# Patient Record
Sex: Male | Born: 1975
Health system: Southern US, Community
[De-identification: ages and names within clinical notes are randomized; demographics above are authoritative.]

## PROBLEM LIST (undated history)

## (undated) DIAGNOSIS — N200 Calculus of kidney: Secondary | ICD-10-CM

## (undated) DIAGNOSIS — A4902 Methicillin resistant Staphylococcus aureus infection, unspecified site: Secondary | ICD-10-CM

## (undated) DIAGNOSIS — R519 Headache, unspecified: Secondary | ICD-10-CM

## (undated) DIAGNOSIS — G8929 Other chronic pain: Secondary | ICD-10-CM

## (undated) DIAGNOSIS — F988 Other specified behavioral and emotional disorders with onset usually occurring in childhood and adolescence: Secondary | ICD-10-CM

## (undated) DIAGNOSIS — R51 Headache: Secondary | ICD-10-CM

## (undated) DIAGNOSIS — B1920 Unspecified viral hepatitis C without hepatic coma: Secondary | ICD-10-CM

## (undated) DIAGNOSIS — C4491 Basal cell carcinoma of skin, unspecified: Secondary | ICD-10-CM

## (undated) HISTORY — DX: Calculus of kidney: N20.0

## (undated) HISTORY — DX: Basal cell carcinoma of skin, unspecified: C44.91

## (undated) HISTORY — DX: Headache, unspecified: R51.9

## (undated) HISTORY — DX: Methicillin resistant Staphylococcus aureus infection, unspecified site: A49.02

## (undated) HISTORY — DX: Headache: R51

## (undated) HISTORY — DX: Unspecified viral hepatitis C without hepatic coma: B19.20

## (undated) HISTORY — DX: Other chronic pain: G89.29

## (undated) HISTORY — PX: SKIN CANCER EXCISION: SHX779

## (undated) HISTORY — DX: Other specified behavioral and emotional disorders with onset usually occurring in childhood and adolescence: F98.8

## (undated) HISTORY — PX: OTHER SURGICAL HISTORY: SHX169

## (undated) HISTORY — PX: MOUTH SURGERY: SHX715

---

## 1998-03-18 ENCOUNTER — Observation Stay (HOSPITAL_COMMUNITY): Admission: EM | Admit: 1998-03-18 | Discharge: 1998-03-19 | Payer: Self-pay | Admitting: Emergency Medicine

## 1998-03-21 ENCOUNTER — Emergency Department (HOSPITAL_COMMUNITY): Admission: EM | Admit: 1998-03-21 | Discharge: 1998-03-21 | Payer: Self-pay | Admitting: Emergency Medicine

## 2004-02-02 ENCOUNTER — Ambulatory Visit: Payer: Self-pay | Admitting: Internal Medicine

## 2004-02-23 ENCOUNTER — Ambulatory Visit: Payer: Self-pay | Admitting: Internal Medicine

## 2004-10-21 ENCOUNTER — Ambulatory Visit: Payer: Self-pay | Admitting: Internal Medicine

## 2004-10-25 ENCOUNTER — Encounter: Admission: RE | Admit: 2004-10-25 | Discharge: 2004-10-25 | Payer: Self-pay | Admitting: Internal Medicine

## 2004-12-27 ENCOUNTER — Ambulatory Visit: Payer: Self-pay | Admitting: Internal Medicine

## 2005-01-19 ENCOUNTER — Ambulatory Visit: Payer: Self-pay | Admitting: Internal Medicine

## 2005-06-10 ENCOUNTER — Emergency Department (HOSPITAL_COMMUNITY): Admission: EM | Admit: 2005-06-10 | Discharge: 2005-06-11 | Payer: Self-pay | Admitting: Emergency Medicine

## 2005-06-14 ENCOUNTER — Ambulatory Visit: Payer: Self-pay | Admitting: Internal Medicine

## 2009-02-23 ENCOUNTER — Emergency Department (HOSPITAL_COMMUNITY): Admission: EM | Admit: 2009-02-23 | Discharge: 2009-02-23 | Payer: Self-pay | Admitting: Emergency Medicine

## 2010-07-20 LAB — DIFFERENTIAL
Basophils Absolute: 0.1 10*3/uL (ref 0.0–0.1)
Eosinophils Absolute: 0.4 10*3/uL (ref 0.0–0.7)
Lymphocytes Relative: 34 % (ref 12–46)
Lymphs Abs: 3.6 10*3/uL (ref 0.7–4.0)
Monocytes Relative: 6 % (ref 3–12)
Neutro Abs: 5.9 10*3/uL (ref 1.7–7.7)
Neutrophils Relative %: 56 % (ref 43–77)

## 2010-07-20 LAB — CBC
HCT: 47.6 % (ref 39.0–52.0)
MCHC: 35 g/dL (ref 30.0–36.0)
MCV: 88.8 fL (ref 78.0–100.0)
Platelets: 212 10*3/uL (ref 150–400)
WBC: 10.5 10*3/uL (ref 4.0–10.5)

## 2010-07-20 LAB — POCT I-STAT, CHEM 8
BUN: 15 mg/dL (ref 6–23)
Calcium, Ion: 1.25 mmol/L (ref 1.12–1.32)
Chloride: 102 mEq/L (ref 96–112)
Creatinine, Ser: 1 mg/dL (ref 0.4–1.5)
Glucose, Bld: 87 mg/dL (ref 70–99)
HCT: 49 % (ref 39.0–52.0)
TCO2: 28 mmol/L (ref 0–100)

## 2011-01-20 ENCOUNTER — Encounter: Payer: Self-pay | Admitting: Gastroenterology

## 2011-01-20 ENCOUNTER — Ambulatory Visit (INDEPENDENT_AMBULATORY_CARE_PROVIDER_SITE_OTHER): Payer: Managed Care, Other (non HMO) | Admitting: Gastroenterology

## 2011-01-20 VITALS — BP 112/70 | HR 100 | Ht 70.0 in | Wt 168.6 lb

## 2011-01-20 DIAGNOSIS — R1013 Epigastric pain: Secondary | ICD-10-CM

## 2011-01-20 MED ORDER — RANITIDINE HCL 150 MG PO CAPS: 150.0000 mg | ORAL_CAPSULE | Freq: Two times a day (BID) | ORAL | Status: DC

## 2011-01-20 NOTE — Assessment & Plan Note (Signed)
Pain is likely due to his heavy use of Goody's powder. I suspect that he may have an active ulcer.  Recommendations #1 discontinue all aspirin products and NSAIDs #2 begin Zantac 150 mg twice a day #3 review prior laboratory ultrasound #4 instructed the patient to call back in 5-7 days if he has not improved at which point I would consider upper endoscopy

## 2011-01-20 NOTE — Progress Notes (Signed)
Past Medical History  Diagnosis Date  . Kidney stone   . ADD (attention deficit disorder)   . Chronic headaches    Past Surgical History  Procedure Date  . Mouth surgery     reports that he has been smoking.  He has never used smokeless tobacco. He reports that he does not drink alcohol or use illicit drugs. family history includes Diabetes in his maternal grandmother; Heart attack in his father; and Pancreatic cancer in his maternal grandmother. No Known Allergies    Detailed review of systems was reviewed;  he complains of chronic headaches. He has frequent low back pain with radiation to his left lower extremity. Other systems were negative .  Bradley Weber is a pleasant 35 year old white male self-referred for evaluation of abdominal pain. For the last 2-3 months she's been complaining of increasingly severe midepigastric pain with radiation to the right upper quadrant. Pain may occur 2-3 times a day and last 2 hours at a time. He denies pyrosis, melena or hematochezia. He takes 2-4 Goody's powder a day. He was evaluated at an outside clinic where an ultrasound apparently demonstrated a kidney stone. Lab work was done although results are not known.  Vital signs from this visit reviewed General Well developed, well nourished  Skin No rashes; anicteric HEENT No pharyngeal abnormalities; pupils equal Neck No masses, thyroidomegaly Chest Clear to auscultation Cardiac No murmus, gallops, rubs Abdomen BS active; no masses, tenderness, organomegaly Rectal No masses; stool heme negative Extremities  no cyanosis, clubbing, edema Skeletal No deformities Neuro Alert; no focal abnormalities

## 2011-01-20 NOTE — Patient Instructions (Signed)
Your prescription is being sent to your pharmacy Call back in 1 week to let us know how your doing

## 2011-01-25 ENCOUNTER — Ambulatory Visit: Payer: Self-pay | Admitting: Internal Medicine

## 2011-02-10 ENCOUNTER — Ambulatory Visit: Payer: Self-pay | Admitting: Gastroenterology

## 2011-04-12 ENCOUNTER — Other Ambulatory Visit: Payer: Self-pay | Admitting: Gastroenterology

## 2011-04-12 DIAGNOSIS — R1013 Epigastric pain: Secondary | ICD-10-CM

## 2011-04-17 MED ORDER — RANITIDINE HCL 150 MG PO CAPS: 150.0000 mg | ORAL_CAPSULE | Freq: Two times a day (BID) | ORAL | Status: DC

## 2011-04-17 NOTE — Telephone Encounter (Signed)
Sent in Zantac to patients pharmacy Tried to contact patient there was no answer

## 2011-07-12 ENCOUNTER — Telehealth: Payer: Self-pay | Admitting: Gastroenterology

## 2011-07-12 DIAGNOSIS — R1013 Epigastric pain: Secondary | ICD-10-CM

## 2011-07-12 MED ORDER — RANITIDINE HCL 150 MG PO CAPS: 150.0000 mg | ORAL_CAPSULE | Freq: Two times a day (BID) | ORAL | Status: DC

## 2011-07-12 NOTE — Telephone Encounter (Signed)
Medication sent.

## 2011-07-13 NOTE — Telephone Encounter (Signed)
Called pt to inform that med was sent to pharmacy

## 2011-07-25 ENCOUNTER — Encounter (HOSPITAL_COMMUNITY): Payer: Self-pay

## 2011-07-25 ENCOUNTER — Emergency Department (HOSPITAL_COMMUNITY)
Admission: EM | Admit: 2011-07-25 | Discharge: 2011-07-26 | Disposition: A | Discharge status: Other Acute Inpatient Hospital | Payer: Managed Care, Other (non HMO) | Attending: Psychiatry | Admitting: Psychiatry

## 2011-07-25 ENCOUNTER — Emergency Department (HOSPITAL_COMMUNITY)
Admission: EM | Admit: 2011-07-25 | Discharge: 2011-07-25 | Disposition: A | Discharge status: Home / Self Care | Payer: Managed Care, Other (non HMO) | Attending: Emergency Medicine | Admitting: Emergency Medicine

## 2011-07-25 DIAGNOSIS — F988 Other specified behavioral and emotional disorders with onset usually occurring in childhood and adolescence: Secondary | ICD-10-CM | POA: Insufficient documentation

## 2011-07-25 DIAGNOSIS — G8929 Other chronic pain: Secondary | ICD-10-CM | POA: Insufficient documentation

## 2011-07-25 DIAGNOSIS — F1120 Opioid dependence, uncomplicated: Secondary | ICD-10-CM | POA: Insufficient documentation

## 2011-07-25 DIAGNOSIS — R51 Headache: Secondary | ICD-10-CM | POA: Insufficient documentation

## 2011-07-25 DIAGNOSIS — F1910 Other psychoactive substance abuse, uncomplicated: Secondary | ICD-10-CM | POA: Insufficient documentation

## 2011-07-25 DIAGNOSIS — F17200 Nicotine dependence, unspecified, uncomplicated: Secondary | ICD-10-CM | POA: Insufficient documentation

## 2011-07-25 LAB — COMPREHENSIVE METABOLIC PANEL WITH GFR
Alkaline Phosphatase: 79 U/L (ref 39–117)
BUN: 18 mg/dL (ref 6–23)
CO2: 26 meq/L (ref 19–32)
Calcium: 9.7 mg/dL (ref 8.4–10.5)
Chloride: 102 meq/L (ref 96–112)
Creatinine, Ser: 0.74 mg/dL (ref 0.50–1.35)
Glucose, Bld: 126 mg/dL — ABNORMAL HIGH (ref 70–99)
Potassium: 4.3 meq/L (ref 3.5–5.1)
Total Protein: 7.5 g/dL (ref 6.0–8.3)

## 2011-07-25 LAB — CBC
HCT: 44.4 % (ref 39.0–52.0)
Hemoglobin: 15.7 g/dL (ref 13.0–17.0)
MCH: 29.8 pg (ref 26.0–34.0)
MCHC: 35.4 g/dL (ref 30.0–36.0)
MCV: 84.3 fL (ref 78.0–100.0)
Platelets: 260 K/uL (ref 150–400)
RBC: 5.27 MIL/uL (ref 4.22–5.81)
RDW: 12.5 % (ref 11.5–15.5)
WBC: 12.1 K/uL — ABNORMAL HIGH (ref 4.0–10.5)

## 2011-07-25 LAB — COMPREHENSIVE METABOLIC PANEL
ALT: 45 U/L (ref 0–53)
AST: 30 U/L (ref 0–37)
Albumin: 4.2 g/dL (ref 3.5–5.2)
GFR calc Af Amer: >90 mL/min (ref >90)
GFR calc non Af Amer: >90 mL/min (ref >90)
Sodium: 138 mEq/L (ref 135–145)
Total Bilirubin: 0.3 mg/dL (ref 0.3–1.2)

## 2011-07-25 LAB — RAPID URINE DRUG SCREEN, HOSP PERFORMED
Amphetamines: POSITIVE — AB
Barbiturates: NOT DETECTED
Benzodiazepines: POSITIVE — AB
Cocaine: NOT DETECTED
Opiates: NOT DETECTED
Tetrahydrocannabinol: NOT DETECTED

## 2011-07-25 LAB — ETHANOL: Alcohol, Ethyl (B): <11 mg/dL (ref 0–11)

## 2011-07-25 MED ORDER — ACETAMINOPHEN 325 MG PO TABS: 650.0000 mg | ORAL_TABLET | ORAL | Status: DC | PRN

## 2011-07-25 MED ORDER — HYDROXYZINE HCL 25 MG PO TABS
25.0000 mg | ORAL_TABLET | Freq: Four times a day (QID) | ORAL | Status: DC | PRN
  Administered 2011-07-26 (×3): 25 mg via ORAL
  Filled 2011-07-25 (×3): qty 1

## 2011-07-25 MED ORDER — ONDANSETRON HCL 4 MG PO TABS: 4.0000 mg | ORAL_TABLET | Freq: Three times a day (TID) | ORAL | Status: DC | PRN

## 2011-07-25 MED ORDER — CLONIDINE HCL 0.1 MG PO TABS
0.1000 mg | ORAL_TABLET | Freq: Four times a day (QID) | ORAL | Status: DC
  Administered 2011-07-25 – 2011-07-26 (×5): 0.1 mg via ORAL
  Filled 2011-07-25 (×5): qty 1

## 2011-07-25 MED ORDER — DICYCLOMINE HCL 20 MG PO TABS
20.0000 mg | ORAL_TABLET | ORAL | Status: DC | PRN
  Administered 2011-07-26 (×2): 20 mg via ORAL
  Filled 2011-07-25 (×2): qty 1

## 2011-07-25 MED ORDER — ZOLPIDEM TARTRATE 5 MG PO TABS: 5.0000 mg | ORAL_TABLET | Freq: Every evening | ORAL | Status: DC | PRN

## 2011-07-25 MED ORDER — NAPROXEN 500 MG PO TABS
500.0000 mg | ORAL_TABLET | Freq: Two times a day (BID) | ORAL | Status: DC | PRN
  Administered 2011-07-25 – 2011-07-26 (×2): 500 mg via ORAL
  Filled 2011-07-25 (×3): qty 1

## 2011-07-25 MED ORDER — CLONIDINE HCL 0.1 MG PO TABS: 0.1000 mg | ORAL_TABLET | ORAL | Status: DC

## 2011-07-25 MED ORDER — ONDANSETRON 4 MG PO TBDP
4.0000 mg | ORAL_TABLET | Freq: Four times a day (QID) | ORAL | Status: DC | PRN
  Administered 2011-07-26 (×2): 4 mg via ORAL
  Filled 2011-07-25 (×2): qty 1

## 2011-07-25 MED ORDER — LORAZEPAM 1 MG PO TABS: 1.0000 mg | ORAL_TABLET | ORAL | Status: DC | PRN

## 2011-07-25 MED ORDER — CLONIDINE HCL 0.1 MG PO TABS: 0.1000 mg | ORAL_TABLET | Freq: Every day | ORAL | Status: DC

## 2011-07-25 MED ORDER — NICOTINE 21 MG/24HR TD PT24
21.0000 mg | MEDICATED_PATCH | Freq: Every day | TRANSDERMAL | Status: DC
  Administered 2011-07-25 – 2011-07-26 (×2): 21 mg via TRANSDERMAL
  Filled 2011-07-25 (×2): qty 1

## 2011-07-25 MED ORDER — METHOCARBAMOL 500 MG PO TABS
500.0000 mg | ORAL_TABLET | Freq: Three times a day (TID) | ORAL | Status: DC | PRN
  Administered 2011-07-26: 500 mg via ORAL
  Filled 2011-07-25: qty 1

## 2011-07-25 MED ORDER — LORAZEPAM 1 MG PO TABS
1.0000 mg | ORAL_TABLET | Freq: Three times a day (TID) | ORAL | Status: DC | PRN
  Administered 2011-07-25 – 2011-07-26 (×4): 1 mg via ORAL
  Filled 2011-07-25 (×4): qty 1

## 2011-07-25 MED ORDER — LOPERAMIDE HCL 2 MG PO CAPS: 2.0000 mg | ORAL_CAPSULE | ORAL | Status: DC | PRN

## 2011-07-25 NOTE — ED Provider Notes (Signed)
History     CSN: [id number]  Arrival date & time 07/25/11  1754   First MD Initiated Contact with Patient 07/25/11 2122      Chief Complaint  Patient presents with  . V70.1    states want detox from percocet    HPI Pt has been addicted to pain medications.  He has had trouble with lower back pain. He had been seeing a Dr. as prescribing oxycodone for him. He had been taking prescription medications but was supplemented with additional medications. The patient had been taking OxyContin 30 mg up to 8 times daily. Patient states he wants to get off these pain medications. He feels like he is addicted to them. He does not need them for his back. He was seen today in the emergency department earlier but decided to go home with outpatient referrals. Patient states she spoke to someone from behavioral health who stated that he needed to be here in the emergency department so he came back.  The patient denies suicidal or homicidal ideation.   Past Medical History  Diagnosis Date  . Kidney stone   . ADD (attention deficit disorder)   . Chronic headaches     Past Surgical History  Procedure Date  . Mouth surgery     Family History  Problem Relation Age of Onset  . Heart attack Father   . Pancreatic cancer Maternal Grandmother   . Diabetes Maternal Grandmother     History  Substance Use Topics  . Smoking status: Current Everyday Smoker  . Smokeless tobacco: Never Used  . Alcohol Use: No     occasionally      Review of Systems  All other systems reviewed and are negative.    Allergies  Review of patient's allergies indicates no known allergies.  Home Medications   Current Outpatient Rx  Name Route Sig Dispense Refill  . AMPHETAMINE-DEXTROAMPHETAMINE 30 MG PO TABS Oral Take 30 mg by mouth daily.     Carmelina Noun STRENGTH PO Oral Take 2 packets by mouth as needed. pain    . DIAZEPAM 5 MG PO TABS Oral Take 5 mg by mouth once.    . OXYCODONE HCL ER 30 MG PO TB12 Oral  Take 30 mg by mouth 3 (three) times daily.     Marland Kitchen RANITIDINE HCL 150 MG PO CAPS Oral Take 1 capsule (150 mg total) by mouth 2 (two) times daily. 60 capsule 2    BP 144/87  Pulse 98  Temp 99 F (37.2 C)  Resp 20  SpO2 99%  Physical Exam  Nursing note and vitals reviewed. Constitutional: He appears well-developed and well-nourished. No distress.  HENT:  Head: Normocephalic and atraumatic.  Right Ear: External ear normal.  Left Ear: External ear normal.  Eyes: Conjunctivae are normal. Right eye exhibits no discharge. Left eye exhibits no discharge. No scleral icterus.  Neck: Neck supple. No tracheal deviation present.  Cardiovascular: Normal rate, regular rhythm and intact distal pulses.   Pulmonary/Chest: Effort normal and breath sounds normal. No stridor. No respiratory distress. He has no wheezes. He has no rales.  Abdominal: Soft. Bowel sounds are normal. He exhibits no distension. There is no tenderness. There is no rebound and no guarding.  Musculoskeletal: He exhibits no edema and no tenderness.  Neurological: He is alert. He has normal strength. No sensory deficit. Cranial nerve deficit:  no gross defecits noted. He exhibits normal muscle tone. He displays no seizure activity. Coordination normal.  Skin: Skin is  warm and dry. No rash noted.  Psychiatric: He has a normal mood and affect.    ED Course  Procedures (including critical care time)  Laboratory results: The patient had laboratory tests drawn earlier today. He had  leukocytosis of 12 a glucose of 126 and was positive for benzodiazepines and amphetamines.   MDM  We will consult the act team to see about trying to get him some sort of inpatient treatment.        Celene Kras, MD 07/25/11 2149

## 2011-07-25 NOTE — ED Notes (Signed)
Patient has two bags of belongings in locker 4 in triage.

## 2011-07-25 NOTE — ED Provider Notes (Signed)
History     CSN: [id number]  Arrival date & time 07/25/11  1102   First MD Initiated Contact with Patient 07/25/11 1156      Chief Complaint  Patient presents with  . V70.1    (Consider location/radiation/quality/duration/timing/severity/associated sxs/prior treatment) Patient is a 36 y.o. male presenting with drug/alcohol assessment. The history is provided by the patient.  Drug / Alcohol Assessment Pertinent negatives include no fever.  Pt states "I need detox from my pain pills." States taking oxycontin 30mg  up to 8 times a day. States originally was prescribed for back pain, but states he is actually addicted to the medication, his back no longer bothering him and he is unable to stop taking the pills. Denies alcohol. Last dose was this morning. No other complaints. No medical problems.   Past Medical History  Diagnosis Date  . Kidney stone   . ADD (attention deficit disorder)   . Chronic headaches     Past Surgical History  Procedure Date  . Mouth surgery     Family History  Problem Relation Age of Onset  . Heart attack Father   . Pancreatic cancer Maternal Grandmother   . Diabetes Maternal Grandmother     History  Substance Use Topics  . Smoking status: Current Everyday Smoker  . Smokeless tobacco: Never Used  . Alcohol Use: No     occasionally      Review of Systems  Constitutional: Negative for fever and chills.  HENT: Negative.   Eyes: Negative.   Respiratory: Negative.   Cardiovascular: Negative.   Gastrointestinal: Negative.   Genitourinary: Negative.   Musculoskeletal: Negative.   Skin: Negative.   Neurological: Negative.   Hematological: Negative.   Psychiatric/Behavioral: Negative.     Allergies  Review of patient's allergies indicates no known allergies.  Home Medications   Current Outpatient Rx  Name Route Sig Dispense Refill  . AMPHETAMINE-DEXTROAMPHETAMINE 30 MG PO TABS Oral Take 30 mg by mouth 2 (two) times daily.      Carmelina Noun STRENGTH PO Oral Take 2 packets by mouth as needed. pain    . OXYCODONE HCL ER 30 MG PO TB12 Oral Take 30 mg by mouth 3 (three) times daily.     Marland Kitchen RANITIDINE HCL 150 MG PO CAPS Oral Take 1 capsule (150 mg total) by mouth 2 (two) times daily. 60 capsule 2    BP 134/92  Pulse 92  Temp 98.7 F (37.1 C)  Resp 20  SpO2 99%  Physical Exam  Nursing note and vitals reviewed. Constitutional: He is oriented to person, place, and time. He appears well-developed and well-nourished. No distress.  HENT:  Head: Normocephalic.  Eyes: Conjunctivae are normal.  Neck: Normal range of motion. Neck supple.  Cardiovascular: Normal rate, regular rhythm and normal heart sounds.   Pulmonary/Chest: Effort normal. No respiratory distress.  Abdominal: Soft. Bowel sounds are normal. There is no tenderness.  Musculoskeletal: Normal range of motion.  Lymphadenopathy:    He has no cervical adenopathy.  Neurological: He is alert and oriented to person, place, and time. No cranial nerve deficit. Coordination normal.  Skin: Skin is warm and dry.  Psychiatric: He has a normal mood and affect.    ED Course  Procedures (including critical care time)  Labs Reviewed  CBC - Abnormal; Notable for the following:    WBC 12.1 (*)    All other components within normal limits  COMPREHENSIVE METABOLIC PANEL - Abnormal; Notable for the following:  Glucose, Bld 126 (*)    All other components within normal limits  URINE RAPID DRUG SCREEN (HOSP PERFORMED) - Abnormal; Notable for the following:    Benzodiazepines POSITIVE (*)    Amphetamines POSITIVE (*)    All other components within normal limits  ETHANOL    Pt asking for opiate detox, however, urine drug screen  Is negative for opioids. Spoke with ACT team, will come assess for inpatient treatment. Pt denies SI or HI.  3:08 PM Pt was assessed by ACT. He will follow up outpatient. Pt denies SI or HI. His VS are normal. He will be d/c home  1. Opioid  dependence       MDM          Lottie Mussel, PA 07/25/11 1511

## 2011-07-25 NOTE — BH Assessment (Signed)
Assessment Note   Bradley Weber is an 36 y.o. male. Patient presented requesting detox from oxycodone. Pt reports using 8 (30 mg) pills per day instead of the 5 he is prescribed.  Pt has detoxed himself at home a few years ago, but feels that he needs help at this time.  Pt inquired about suboxene treatment, and this writer advised [facility] does not offer suboxene treatment. Pt began discussing the influences of his wife and mother for him to get help, but he doesn't think he can stay here, especially being a smoker.  CSW offered support.  Patient expressed desire to be discharged with outpatient referrals. CSW advised patient that if he finds outpatient to be unhelpful, or not what he needs, he can return to the ED.  Pt appreciative of information. Discussed with ED PA who agrees with recommendation for discharge.   Axis I: Substance Abuse Axis II: Deferred Axis III:  Past Medical History  Diagnosis Date  . Kidney stone   . ADD (attention deficit disorder)   . Chronic headaches    Axis IV: other psychosocial or environmental problems and problems with primary support group Axis V: 51-60 moderate symptoms  Past Medical History:  Past Medical History  Diagnosis Date  . Kidney stone   . ADD (attention deficit disorder)   . Chronic headaches     Past Surgical History  Procedure Date  . Mouth surgery     Family History:  Family History  Problem Relation Age of Onset  . Heart attack Father   . Pancreatic cancer Maternal Grandmother   . Diabetes Maternal Grandmother     Social History:  reports that he has been smoking.  He has never used smokeless tobacco. He reports that he does not drink alcohol or use illicit drugs.  Additional Social History:    Allergies: No Known Allergies  Home Medications:  Medications Prior to Admission  Medication Dose Route Frequency Provider Last Rate Last Dose  . acetaminophen (TYLENOL) tablet 650 mg  650 mg Oral Q4H PRN Lottie Mussel, PA       . LORazepam (ATIVAN) tablet 1 mg  1 mg Oral Q4H PRN Tatyana A Kirichenko, PA      . ondansetron (ZOFRAN) tablet 4 mg  4 mg Oral Q8H PRN Tatyana A Kirichenko, PA      . zolpidem (AMBIEN) tablet 5 mg  5 mg Oral QHS PRN Tatyana A Kirichenko, PA       Medications Prior to Admission  Medication Sig Dispense Refill  . amphetamine-dextroamphetamine (ADDERALL) 30 MG tablet Take 30 mg by mouth 2 (two) times daily.        . Aspirin-Acetaminophen-Caffeine (GOODYS EXTRA STRENGTH PO) Take 2 packets by mouth as needed. pain      . ranitidine (ZANTAC) 150 MG capsule Take 1 capsule (150 mg total) by mouth 2 (two) times daily.  60 capsule  2    OB/GYN Status:  No LMP for male patient.  General Assessment Data Location of Assessment: [facility] ED Living Arrangements: Spouse/significant other Can pt return to current living arrangement?: Yes Admission Status: Voluntary Is patient capable of signing voluntary admission?: Yes Transfer from: Acute Hospital Referral Source: Self/Family/Friend  Education Status Is patient currently in school?: No  Risk to self Suicidal Ideation: No Suicidal Intent: No Is patient at risk for suicide?: No Suicidal Plan?: No Access to Means: No Previous Attempts/Gestures: No Triggers for Past Attempts: None known Intentional Self Injurious Behavior: None Family Suicide History: No  Recent stressful life event(s): Conflict (Comment) (Conflict with mother and spouse about RX abuse) Persecutory voices/beliefs?: No Depression: No Substance abuse history and/or treatment for substance abuse?: Yes Suicide prevention information given to non-admitted patients: Not applicable  Risk to Others Homicidal Ideation: No Thoughts of Harm to Others: No Current Homicidal Intent: No Current Homicidal Plan: No Access to Homicidal Means: No History of harm to others?: No Assessment of Violence: None Noted Does patient have access to weapons?: No Criminal Charges Pending?: No Does  patient have a court date: No  Psychosis Hallucinations: None noted Delusions: None noted  Mental Status Report Appear/Hygiene: Improved Eye Contact: Good Motor Activity: Freedom of movement Speech: Slurred;Slow Level of Consciousness: Alert Mood: Preoccupied Affect: Appropriate to circumstance Anxiety Level: None Thought Processes: Coherent;Relevant Judgement: Unimpaired Orientation: Person;Time;Place;Situation Obsessive Compulsive Thoughts/Behaviors: None  Cognitive Functioning Concentration: Normal Memory: Recent Intact;Remote Intact IQ: Average Insight: Fair Impulse Control: Fair Appetite: Good Sleep: No Change Vegetative Symptoms: None  Prior Inpatient Therapy Prior Inpatient Therapy: No  Prior Outpatient Therapy Prior Outpatient Therapy: No            Values / Beliefs Cultural Requests During Hospitalization: None Spiritual Requests During Hospitalization: None        Additional Information 1:1 In Past 12 Months?: No CIRT Risk: No Elopement Risk: No Does patient have medical clearance?: Yes     Disposition:  Disposition Disposition of Patient: Outpatient treatment Type of outpatient treatment: Chemical Dependence - Intensive Outpatient;[facility])  On Site Evaluation by:   Reviewed with Physician:     Marlaine Hind ANN S 07/25/2011 2:48 PM

## 2011-07-25 NOTE — ED Notes (Signed)
Pt was recently here today request detox from percocet denies h/i denies s/i

## 2011-07-25 NOTE — Discharge Instructions (Signed)
Do not take any more oxycontin or any other addicting prescription medications. Follow up outpatient.   Finding Treatment for Alcohol and Drug Addiction It can be hard to find the right place to get professional treatment. Here are some important things to consider:  There are different types of treatment to choose from.   Some programs are live-in (residential) while others are not (outpatient). Sometimes a combination is offered.   No single type of program is right for everyone.   Most treatment programs involve a combination of education, counseling, and a 12-step, spiritually-based approach.   There are non-spiritually based programs (not 12-step).   Some treatment programs are government sponsored. They are geared for patients without private insurance.   Treatment programs can vary in many respects such as:   Cost and types of insurance accepted.   Types of on-site medical services offered.   Length of stay, setting, and size.   Overall philosophy of treatment.  A person may need specialized treatment or have needs not addressed by all programs. For example, adolescents need treatment appropriate for their age. Other people have secondary disorders that must be managed as well. Secondary conditions can include mental illness, such as depression or diabetes. Often, a period of detoxification from alcohol or drugs is needed. This requires medical supervision and not all programs offer this. THINGS TO CONSIDER WHEN SELECTING A TREATMENT PROGRAM   Is the program certified by the appropriate government agency? Even private programs must be certified and employ certified professionals.   Does the program accept your insurance? If not, can a payment plan be set up?   Is the facility clean, organized, and well run? Do they allow you to speak with graduates who can share their treatment experience with you? Can you tour the facility? Can you meet with staff?   Does the program meet  the full range of individual needs?   Does the treatment program address sexual orientation and physical disabilities? Do they provide age, gender, and culturally appropriate treatment services?   Is treatment available in languages other than English?   Is long-term aftercare support or guidance encouraged and provided?   Is assessment of an individual's treatment plan ongoing to ensure it meets changing needs?   Does the program use strategies to encourage reluctant patients to remain in treatment long enough to increase the likelihood of success?   Does the program offer counseling (individual or group) and other behavioral therapies?   Does the program offer medicine as part of the treatment regimen, if needed?   Is there ongoing monitoring of possible relapse? Is there a defined relapse prevention program? Are services or referrals offered to family members to ensure they understand addiction and the recovery process? This would help them support the recovering individual.   Are 12-step meetings held at the center or is transport available for patients to attend outside meetings?  In countries outside of the [place]. and [place], Magazine features editor for contact information for services in your area. Document Released: 03/02/2005 Document Revised: 03/23/2011 Document Reviewed: 09/12/2007 [facility] Patient Information 2012 [place], [place].

## 2011-07-25 NOTE — ED Notes (Signed)
Pt states he wants detox from percocet states last use was this am pt cooperative denies s/i denies h/i

## 2011-07-25 NOTE — ED Notes (Signed)
Pt states he wants to get detox from percocet.  Denies SI/HI.

## 2011-07-25 NOTE — ED Provider Notes (Signed)
Medical screening examination/treatment/procedure(s) were performed by non-physician practitioner and as supervising physician I was immediately available for consultation/collaboration.   Gavin Pound. Trenika Hudson, MD 07/25/11 1710

## 2011-07-26 ENCOUNTER — Inpatient Hospital Stay (HOSPITAL_COMMUNITY)
Admission: AD | Admit: 2011-07-26 | Payer: Managed Care, Other (non HMO) | Source: Ambulatory Visit | Admitting: Psychiatry

## 2011-07-26 ENCOUNTER — Encounter (HOSPITAL_COMMUNITY): Payer: Self-pay | Admitting: *Deleted

## 2011-07-26 MED ORDER — KETOROLAC TROMETHAMINE 60 MG/2ML IM SOLN
60.0000 mg | Freq: Once | INTRAMUSCULAR | Status: AC
  Administered 2011-07-26: 60 mg via INTRAMUSCULAR
  Filled 2011-07-26: qty 2

## 2011-07-26 NOTE — ED Notes (Signed)
Patient ambulatory with a steady gait with security and tech. Respirations equal and unlabored. Skin warm and dry. No acute distress noted.

## 2011-07-26 NOTE — ED Provider Notes (Signed)
Pt was accepted at [facility] for tx of narcotic addiction  Cheri Guppy, MD 07/26/11 2227

## 2011-07-26 NOTE — BH Assessment (Signed)
Assessment Note   Pt has been accepted to [facility] by Dr. Lolly Mustache to attending Dr. Anselm Jungling [phone], EDP has been notified and aggress with plan. Support paperwork has been faxed to [facility].   Georgina Quint A 07/26/2011 10:49 PM

## 2011-07-26 NOTE — BH Assessment (Signed)
Assessment Note   Bradley Weber is a 36 y.o. male who presents to [facility] for detox from oxycodone.  Pt was d/c'd earlier from [facility] after being provided outpatient referrals for detox.  At the time the pt inquired about suboxone treatment at [facility] and was advised this intervention is not available at [facility].  Pt returns and told this writer that there was a misunderstanding when he was at emergency department and now he wants inpt detox from his pain pills.  Pt reports not using any pills since his d/c on 07/25/11 from the emergency dept, UDS does not reflect any opiate use.  Pt denies SI/HI/Psych.  Pt is c/o w/d sxs: leg cramps, muscle spasms, sweats,hot/cold flashes restless sleep. Pt reports using 8-9 30mg s pills daily and has been using for approx yr.  Pt was prescribed pills for back issues and has now become dependent on meds.    Axis I: Opioid Dep 304.00 Axis II: Deferred Axis III:  Past Medical History  Diagnosis Date  . Kidney stone   . ADD (attention deficit disorder)   . Chronic headaches    Axis IV: other psychosocial or environmental problems, problems related to social environment and problems with primary support group Axis V: 41-50 serious symptoms  Past Medical History:  Past Medical History  Diagnosis Date  . Kidney stone   . ADD (attention deficit disorder)   . Chronic headaches     Past Surgical History  Procedure Date  . Mouth surgery     Family History:  Family History  Problem Relation Age of Onset  . Heart attack Father   . Pancreatic cancer Maternal Grandmother   . Diabetes Maternal Grandmother     Social History:  reports that he has been smoking.  He has never used smokeless tobacco. He reports that he uses illicit drugs (Oxycodone). He reports that he does not drink alcohol.  Additional Social History:  Alcohol / Drug Use Pain Medications: Oxycodone  Prescriptions: Oxycodone  Over the Counter: None  History of alcohol / drug use?: Yes Longest period  of sobriety (when/how long): None  Negative Consequences of Use: Personal relationships Withdrawal Symptoms: Cramps;Sweats;Other (Comment);Fever / Chills (Muscle spasms ) Substance #1 Name of Substance 1: Pain Medication--Oxycodone  1 - Age of First Use: Unk  1 - Amount (size/oz): 8-9 30MG S Pills  1 - Frequency: Daily  1 - Duration: On-going  1 - Last Use / Amount: 07/25/11 Allergies: No Known Allergies  Home Medications:  Medications Prior to Admission  Medication Dose Route Frequency Provider Last Rate Last Dose  . cloNIDine (CATAPRES) tablet 0.1 mg  0.1 mg Oral QID Celene Kras, MD   0.1 mg at 07/25/11 2207   Followed by  . cloNIDine (CATAPRES) tablet 0.1 mg  0.1 mg Oral BH-qamhs Celene Kras, MD       Followed by  . cloNIDine (CATAPRES) tablet 0.1 mg  0.1 mg Oral QAC breakfast Celene Kras, MD      . dicyclomine (BENTYL) tablet 20 mg  20 mg Oral Q4H PRN Celene Kras, MD      . hydrOXYzine (ATARAX/VISTARIL) tablet 25 mg  25 mg Oral Q6H PRN Celene Kras, MD   25 mg at 07/26/11 0010  . ketorolac (TORADOL) injection 60 mg  60 mg Intramuscular Once Vida Roller, MD   60 mg at 07/26/11 0132  . loperamide (IMODIUM) capsule 2-4 mg  2-4 mg Oral PRN Celene Kras, MD      .  LORazepam (ATIVAN) tablet 1 mg  1 mg Oral Q8H PRN Celene Kras, MD   1 mg at 07/25/11 2207  . methocarbamol (ROBAXIN) tablet 500 mg  500 mg Oral Q8H PRN Celene Kras, MD   500 mg at 07/26/11 0010  . naproxen (NAPROSYN) tablet 500 mg  500 mg Oral BID PRN Celene Kras, MD   500 mg at 07/25/11 2207  . nicotine (NICODERM CQ - dosed in mg/24 hours) patch 21 mg  21 mg Transdermal Daily Celene Kras, MD   21 mg at 07/25/11 2205  . ondansetron (ZOFRAN-ODT) disintegrating tablet 4 mg  4 mg Oral Q6H PRN Celene Kras, MD      . DISCONTD: acetaminophen (TYLENOL) tablet 650 mg  650 mg Oral Q4H PRN Tatyana A Kirichenko, PA      . DISCONTD: LORazepam (ATIVAN) tablet 1 mg  1 mg Oral Q4H PRN Tatyana A Kirichenko, PA      . DISCONTD: ondansetron  (ZOFRAN) tablet 4 mg  4 mg Oral Q8H PRN Tatyana A Kirichenko, PA      . DISCONTD: zolpidem (AMBIEN) tablet 5 mg  5 mg Oral QHS PRN Tatyana A Kirichenko, PA       Medications Prior to Admission  Medication Sig Dispense Refill  . amphetamine-dextroamphetamine (ADDERALL) 30 MG tablet Take 30 mg by mouth daily.       . Aspirin-Acetaminophen-Caffeine (GOODYS EXTRA STRENGTH PO) Take 2 packets by mouth as needed. pain      . ranitidine (ZANTAC) 150 MG capsule Take 1 capsule (150 mg total) by mouth 2 (two) times daily.  60 capsule  2    OB/GYN Status:  No LMP for male patient.  General Assessment Data Location of Assessment: [facility] ED Living Arrangements: Spouse/significant other Can pt return to current living arrangement?: Yes Admission Status: Voluntary Is patient capable of signing voluntary admission?: Yes Transfer from: Acute Hospital  Education Status Is patient currently in school?: No Current Grade: None  Highest grade of school patient has completed: None  Name of school: None  Contact person: None   Risk to self Suicidal Ideation: No Suicidal Intent: No Is patient at risk for suicide?: No Suicidal Plan?: No Access to Means: No What has been your use of drugs/alcohol within the last 12 months?: Opiates--oxycodone  Previous Attempts/Gestures: No How many times?: 0  Other Self Harm Risks: None  Triggers for Past Attempts: None known Intentional Self Injurious Behavior: None Family Suicide History: No Recent stressful life event(s): Conflict (Comment) (Due to SA ) Persecutory voices/beliefs?: No Depression: No Depression Symptoms:  (None Reported ) Substance abuse history and/or treatment for substance abuse?: Yes Suicide prevention information given to non-admitted patients: Not applicable  Risk to Others Homicidal Ideation: No Thoughts of Harm to Others: No Current Homicidal Intent: No Current Homicidal Plan: No Access to Homicidal Means: No Identified Victim: None    History of harm to others?: No Assessment of Violence: None Noted Violent Behavior Description: None  Does patient have access to weapons?: No Criminal Charges Pending?: No Does patient have a court date: No  Psychosis Hallucinations: None noted Delusions: None noted  Mental Status Report Appear/Hygiene: Other (Comment) (Appropriate ) Eye Contact: Fair Motor Activity: Tremors Speech: Soft;Logical/coherent Level of Consciousness: Alert Mood: Sad Affect: Appropriate to circumstance Anxiety Level: None Thought Processes: Coherent;Relevant Judgement: Unimpaired Orientation: Person;Place;Time;Situation Obsessive Compulsive Thoughts/Behaviors: None  Cognitive Functioning Concentration: Normal Memory: Recent Intact;Remote Intact IQ: Average Insight: Fair Impulse Control: Fair Appetite: Good Weight  Loss: 0  Weight Gain: 0  Sleep: No Change Total Hours of Sleep: 8  Vegetative Symptoms: None  Prior Inpatient Therapy Prior Inpatient Therapy: No Prior Therapy Dates: None  Prior Therapy Facilty/Provider(s): None  Reason for Treatment: None   Prior Outpatient Therapy Prior Outpatient Therapy: No Prior Therapy Dates: None  Prior Therapy Facilty/Provider(s): None  Reason for Treatment: None   ADL Screening (condition at time of admission) Patient's cognitive ability adequate to safely complete daily activities?: Yes Patient able to express need for assistance with ADLs?: Yes Independently performs ADLs?: Yes Weakness of Legs: None Weakness of Arms/Hands: None       Abuse/Neglect Assessment (Assessment to be complete while patient is alone) Physical Abuse: Denies Verbal Abuse: Denies Sexual Abuse: Denies Exploitation of patient/patient's resources: Denies Self-Neglect: Denies Values / Beliefs Cultural Requests During Hospitalization: None Spiritual Requests During Hospitalization: None Consults Spiritual Care Consult Needed: No Social Work Consult Needed:  No Merchant navy officer (For Healthcare) Advance Directive: Patient does not have advance directive;Patient would not like information Pre-existing out of facility DNR order (yellow form or pink MOST form): No    Additional Information 1:1 In Past 12 Months?: No CIRT Risk: No Elopement Risk: No Does patient have medical clearance?: Yes     Disposition:  Disposition Disposition of Patient: Inpatient treatment program;Referred to [facility]) Type of inpatient treatment program: Adult Type of outpatient treatment: Adult Patient referred to: Other (Comment) [facility] )  On Site Evaluation by:   Reviewed with Physician:     Murrell Redden 07/26/2011 4:15 AM

## 2011-07-27 ENCOUNTER — Telehealth: Payer: Self-pay | Admitting: Internal Medicine

## 2011-07-27 NOTE — Telephone Encounter (Signed)
Caller: Sherry/Spouse; PCP: Marga Melnick; CB#: [phone]; ; ; Call regarding Muscle Spasms; states has not been seen in office x 3 years.  Has been seeing Dr. Ethelene Hal at [facility] and taking oxycodone.  States wants to go to outpatient rehab.  Going through withdrawal.  [facility] wants hiim to be admitted, but has no beds.  When admitted, was told by psychiatrist to come back 07/27/11.  Wife took him home.  States he needs to be admitted, and his ativan is wearing off.  Per protocol, advised appt within 4 hours.  TC to office for new patient appt; per [place], Dr. Alwyn Ren advised [facility] as inpatient.  TC to [facility]; states patient did come in 2300 07/27/11 but left AMA.  TC to spouse/Sherry; per [facility]'s instructions, to go back through ED to restart process, and not leave until he has been admitted to a bed inpatient.  Spouse agrees; will bring to [facility] ED.

## 2011-07-27 NOTE — Progress Notes (Signed)
Patient arrived at [facility] for admission with wife.  Patient and wife decided they did not want to come inpatient and wanted to go home.  Both state ED MD gave them the option of going home or coming into [facility].  Wife states she has already been in contact with Daneil Dolin, CD IOP counselor to set up CD IOP for her husband.  Patient says he does not want to stay any longer than one night and wants to be transferred to CD IOP in the morning. If that could not be guaranteed, he did not want to stay at [facility].  Spoke with Judeth Cornfield, ACT team counselor in the ED who verified patient given option of going home or coming to [facility] by charge nurse and ED physician.  Wife states she was told patient could leave [facility] whenever he wanted since he was a voluntary patient. Once process described to patient and wife, both requested discharge.  Reviewed case with Dr. Lolly Mustache and he approved the discharge.  Patient denied suicidal ideation. He denied anxiety, nausea, cramping, tremors, and body aches. No sweating or tremors noted. Patient states he received medication in the ED and was not having any withdrawal symptoms and had not had any withdrawal symptoms today. Patient appeared sleepy but answered all questions appropriately. Wife has number to [facility] assessment and to Daneil Dolin. Wife will follow up in the morning to begin CD IOP.  Wife instructed to return patient to ED if patient experiences any withdrawal symptoms, suicidal ideation, or physical discomfort. Wife and patient appreciative of discharge. Escorted to car with belongings. Rosey Bath, RN

## 2011-07-27 NOTE — Telephone Encounter (Signed)
He has not been seen for 3 years and would be considered a newpatient.. My practice is now closed to new patients. He should continue followup with his back specialist and the mental health program. It sounds as if he needs to be in an addiction program as soon as possible.

## 2011-08-02 ENCOUNTER — Encounter: Payer: Self-pay | Admitting: Gastroenterology

## 2011-08-02 ENCOUNTER — Telehealth: Payer: Self-pay | Admitting: Gastroenterology

## 2011-08-02 ENCOUNTER — Ambulatory Visit (INDEPENDENT_AMBULATORY_CARE_PROVIDER_SITE_OTHER): Payer: Managed Care, Other (non HMO) | Admitting: Gastroenterology

## 2011-08-02 VITALS — BP 96/56 | HR 84 | Ht 70.0 in | Wt 167.1 lb

## 2011-08-02 DIAGNOSIS — R1013 Epigastric pain: Secondary | ICD-10-CM

## 2011-08-02 NOTE — Telephone Encounter (Signed)
Pt states that he is having epigastric pain every day. States Dr. Arlyce Dice had wanted to do an EGD and the pt did not want to have it done at the time. Thinks he may need to have the egd now. Pt scheduled to see Dr. Arlyce Dice 08/02/11@2 :15pm. Pt aware of appt date and time.

## 2011-08-02 NOTE — Patient Instructions (Addendum)
You have been given a separate informational sheet regarding your tobacco use, the importance of quitting and local resources to help you quit. Upper GI Endoscopy Upper GI endoscopy means using a flexible scope to look at the esophagus, stomach, and upper small bowel. This is done to make a diagnosis in people with heartburn, abdominal pain, or abnormal bleeding. Sometimes an endoscope is needed to remove foreign bodies or food that become stuck in the esophagus; it can also be used to take biopsy samples. For the best results, do not eat or drink for 8 hours before having your upper endoscopy.  To perform the endoscopy, you will probably be sedated and your throat will be numbed with a special spray. The endoscope is then slowly passed down your throat (this will not interfere with your breathing). An endoscopy exam takes 15 to 30 minutes to complete and there is no real pain. Patients rarely remember much about the procedure. The results of the test may take several days if a biopsy or other test is taken.  You may have a sore throat after an endoscopy exam. Serious complications are very rare. Stick to liquids and soft foods until your pain is better. Do not drive a car or operate any dangerous equipment for at least 24 hours after being sedated. SEEK IMMEDIATE MEDICAL CARE IF:   You have severe throat pain.   You have shortness of breath.   You have bleeding problems.   You have a fever.   You have difficulty recovering from your sedation.  Document Released: 05/11/2004 Document Revised: 03/23/2011 Document Reviewed: 04/05/2008 [facility] Patient Information 2012 [place], [place].  Your Endoscopy is scheduled on 08/29/2011 at 8:30am

## 2011-08-02 NOTE — Assessment & Plan Note (Addendum)
Persistent abdominal pain which is temporarily relieved with ranitidine. This raises the concern for ulcer or nonulcer dyspepsia, and  H. pylori infection.  Pain from chronic cholecystitis is much less likely.  Recommendations #1 begin Zegerid 40 mg daily #2 upper endoscopy

## 2011-08-02 NOTE — Progress Notes (Signed)
History of Present Illness:  Bradley Weber has returned continuing to complain of epigastric pain. He is taking 300 Zantac twice a day. He gets temporary relief for several hours with the medicine. It inexorably returns in the mornings before he takes another dose, and in the afternoons. He denies nausea or pyrosis. He denies use of  nonsteroidals or aspirin products. There is been no change in his bowel habits.    Review of Systems: Pertinent positive and negative review of systems were noted in the above HPI section. All other review of systems were otherwise negative.    Current Medications, Allergies, Past Medical History, Past Surgical History, Family History and Social History were reviewed in [facility] electronic medical record  Vital signs were reviewed in today's medical record. Physical Exam: General: Well developed , well nourished, no acute distress  Abdomen: Soft,and non distended. No masses, hepatosplenomegaly or hernias noted. Normal Bowel sounds; there is minimal tenderness to palpation in the midepigastrium

## 2011-08-03 ENCOUNTER — Encounter: Payer: Self-pay | Admitting: Gastroenterology

## 2011-08-28 ENCOUNTER — Telehealth: Payer: Self-pay | Admitting: Gastroenterology

## 2011-08-29 ENCOUNTER — Encounter: Payer: Managed Care, Other (non HMO) | Admitting: Gastroenterology

## 2011-08-29 NOTE — Telephone Encounter (Signed)
yes

## 2011-09-25 ENCOUNTER — Telehealth: Payer: Self-pay | Admitting: Critical Care Medicine

## 2011-09-25 NOTE — Telephone Encounter (Signed)
Opened in error

## 2011-10-26 ENCOUNTER — Ambulatory Visit: Payer: Managed Care, Other (non HMO) | Admitting: Family Medicine

## 2011-12-20 ENCOUNTER — Other Ambulatory Visit: Payer: Self-pay | Admitting: Sports Medicine

## 2011-12-20 DIAGNOSIS — M5410 Radiculopathy, site unspecified: Secondary | ICD-10-CM

## 2011-12-27 ENCOUNTER — Inpatient Hospital Stay: Admission: RE | Admit: 2011-12-27 | Payer: Self-pay | Source: Ambulatory Visit

## 2012-01-30 ENCOUNTER — Ambulatory Visit
Admission: RE | Admit: 2012-01-30 | Discharge: 2012-01-30 | Disposition: A | Discharge status: Home / Self Care | Payer: Managed Care, Other (non HMO) | Source: Ambulatory Visit | Attending: Sports Medicine | Admitting: Sports Medicine

## 2012-01-30 DIAGNOSIS — M5410 Radiculopathy, site unspecified: Secondary | ICD-10-CM

## 2018-12-19 ENCOUNTER — Emergency Department (HOSPITAL_COMMUNITY): Payer: Self-pay

## 2018-12-19 ENCOUNTER — Inpatient Hospital Stay (HOSPITAL_COMMUNITY)
Admission: EM | Admit: 2018-12-19 | Discharge: 2019-01-07 | DRG: 853 | Discharge status: Against Medical Advice | Payer: Self-pay | Attending: Internal Medicine | Admitting: Internal Medicine

## 2018-12-19 ENCOUNTER — Encounter (HOSPITAL_COMMUNITY): Payer: Self-pay | Admitting: Emergency Medicine

## 2018-12-19 ENCOUNTER — Other Ambulatory Visit: Payer: Self-pay

## 2018-12-19 DIAGNOSIS — Z833 Family history of diabetes mellitus: Secondary | ICD-10-CM

## 2018-12-19 DIAGNOSIS — B1920 Unspecified viral hepatitis C without hepatic coma: Secondary | ICD-10-CM | POA: Diagnosis present

## 2018-12-19 DIAGNOSIS — Z8614 Personal history of Methicillin resistant Staphylococcus aureus infection: Secondary | ICD-10-CM

## 2018-12-19 DIAGNOSIS — R52 Pain, unspecified: Secondary | ICD-10-CM

## 2018-12-19 DIAGNOSIS — I330 Acute and subacute infective endocarditis: Secondary | ICD-10-CM | POA: Diagnosis present

## 2018-12-19 DIAGNOSIS — F17210 Nicotine dependence, cigarettes, uncomplicated: Secondary | ICD-10-CM | POA: Diagnosis present

## 2018-12-19 DIAGNOSIS — Z20828 Contact with and (suspected) exposure to other viral communicable diseases: Secondary | ICD-10-CM | POA: Diagnosis present

## 2018-12-19 DIAGNOSIS — M4646 Discitis, unspecified, lumbar region: Secondary | ICD-10-CM | POA: Diagnosis present

## 2018-12-19 DIAGNOSIS — G061 Intraspinal abscess and granuloma: Secondary | ICD-10-CM | POA: Diagnosis present

## 2018-12-19 DIAGNOSIS — K5909 Other constipation: Secondary | ICD-10-CM | POA: Diagnosis present

## 2018-12-19 DIAGNOSIS — Z1629 Resistance to other single specified antibiotic: Secondary | ICD-10-CM | POA: Diagnosis present

## 2018-12-19 DIAGNOSIS — Z79899 Other long term (current) drug therapy: Secondary | ICD-10-CM

## 2018-12-19 DIAGNOSIS — Z8249 Family history of ischemic heart disease and other diseases of the circulatory system: Secondary | ICD-10-CM

## 2018-12-19 DIAGNOSIS — N179 Acute kidney failure, unspecified: Secondary | ICD-10-CM | POA: Diagnosis present

## 2018-12-19 DIAGNOSIS — E876 Hypokalemia: Secondary | ICD-10-CM | POA: Diagnosis present

## 2018-12-19 DIAGNOSIS — A4101 Sepsis due to Methicillin susceptible Staphylococcus aureus: Principal | ICD-10-CM | POA: Diagnosis present

## 2018-12-19 DIAGNOSIS — Q211 Atrial septal defect: Secondary | ICD-10-CM

## 2018-12-19 DIAGNOSIS — R748 Abnormal levels of other serum enzymes: Secondary | ICD-10-CM | POA: Diagnosis present

## 2018-12-19 DIAGNOSIS — R7401 Elevation of levels of liver transaminase levels: Secondary | ICD-10-CM

## 2018-12-19 DIAGNOSIS — F1120 Opioid dependence, uncomplicated: Secondary | ICD-10-CM | POA: Diagnosis present

## 2018-12-19 DIAGNOSIS — Z85828 Personal history of other malignant neoplasm of skin: Secondary | ICD-10-CM

## 2018-12-19 DIAGNOSIS — Z87442 Personal history of urinary calculi: Secondary | ICD-10-CM

## 2018-12-19 DIAGNOSIS — M609 Myositis, unspecified: Secondary | ICD-10-CM | POA: Diagnosis present

## 2018-12-19 DIAGNOSIS — R5381 Other malaise: Secondary | ICD-10-CM | POA: Diagnosis present

## 2018-12-19 DIAGNOSIS — W010XXA Fall on same level from slipping, tripping and stumbling without subsequent striking against object, initial encounter: Secondary | ICD-10-CM | POA: Diagnosis present

## 2018-12-19 DIAGNOSIS — Z7151 Drug abuse counseling and surveillance of drug abuser: Secondary | ICD-10-CM

## 2018-12-19 DIAGNOSIS — J189 Pneumonia, unspecified organism: Secondary | ICD-10-CM | POA: Diagnosis present

## 2018-12-19 DIAGNOSIS — F988 Other specified behavioral and emotional disorders with onset usually occurring in childhood and adolescence: Secondary | ICD-10-CM | POA: Diagnosis present

## 2018-12-19 DIAGNOSIS — G062 Extradural and subdural abscess, unspecified: Secondary | ICD-10-CM

## 2018-12-19 LAB — URINALYSIS, ROUTINE W REFLEX MICROSCOPIC
Bilirubin Urine: NEGATIVE
Glucose, UA: 50 mg/dL — AB
Ketones, ur: NEGATIVE mg/dL
Leukocytes,Ua: NEGATIVE
Nitrite: NEGATIVE
Protein, ur: 100 mg/dL — AB
Specific Gravity, Urine: 1.024 (ref 1.005–1.030)
WBC, UA: >50 WBC/hpf — ABNORMAL HIGH (ref 0–5)
pH: 5 (ref 5.0–8.0)

## 2018-12-19 LAB — COMPREHENSIVE METABOLIC PANEL
ALT: 97 U/L — ABNORMAL HIGH (ref 0–44)
AST: 70 U/L — ABNORMAL HIGH (ref 15–41)
Albumin: 3.6 g/dL (ref 3.5–5.0)
Alkaline Phosphatase: 112 U/L (ref 38–126)
Anion gap: 14 (ref 5–15)
BUN: 16 mg/dL (ref 6–20)
CO2: 25 mmol/L (ref 22–32)
Calcium: 9.1 mg/dL (ref 8.9–10.3)
Chloride: 98 mmol/L (ref 98–111)
Creatinine, Ser: 1.92 mg/dL — ABNORMAL HIGH (ref 0.61–1.24)
GFR calc Af Amer: 48 mL/min — ABNORMAL LOW (ref >60)
GFR calc non Af Amer: 42 mL/min — ABNORMAL LOW (ref >60)
Glucose, Bld: 103 mg/dL — ABNORMAL HIGH (ref 70–99)
Potassium: 3.7 mmol/L (ref 3.5–5.1)
Sodium: 137 mmol/L (ref 135–145)
Total Bilirubin: 1.8 mg/dL — ABNORMAL HIGH (ref 0.3–1.2)
Total Protein: 7.4 g/dL (ref 6.5–8.1)

## 2018-12-19 LAB — CBC
HCT: 46.9 % (ref 39.0–52.0)
HCT: 40.4 % (ref 39.0–52.0)
Hemoglobin: 16 g/dL (ref 13.0–17.0)
Hemoglobin: 13.7 g/dL (ref 13.0–17.0)
MCH: 29.1 pg (ref 26.0–34.0)
MCH: 28.8 pg (ref 26.0–34.0)
MCHC: 34.1 g/dL (ref 30.0–36.0)
MCHC: 33.9 g/dL (ref 30.0–36.0)
MCV: 85.4 fL (ref 80.0–100.0)
MCV: 84.9 fL (ref 80.0–100.0)
Platelets: 205 10*3/uL (ref 150–400)
Platelets: 169 10*3/uL (ref 150–400)
RBC: 5.49 MIL/uL (ref 4.22–5.81)
RBC: 4.76 MIL/uL (ref 4.22–5.81)
RDW: 12.5 % (ref 11.5–15.5)
RDW: 12.6 % (ref 11.5–15.5)
WBC: 29.5 10*3/uL — ABNORMAL HIGH (ref 4.0–10.5)
WBC: 13.9 10*3/uL — ABNORMAL HIGH (ref 4.0–10.5)
nRBC: 0 % (ref 0.0–0.2)
nRBC: 0 % (ref 0.0–0.2)

## 2018-12-19 LAB — CREATININE, SERUM
Creatinine, Ser: 1.16 mg/dL (ref 0.61–1.24)
GFR calc Af Amer: >60 mL/min (ref >60)
GFR calc non Af Amer: >60 mL/min (ref >60)

## 2018-12-19 LAB — MRSA PCR SCREENING: MRSA by PCR: NEGATIVE

## 2018-12-19 LAB — MAGNESIUM: Magnesium: 1.7 mg/dL (ref 1.7–2.4)

## 2018-12-19 LAB — C-REACTIVE PROTEIN: CRP: 20.8 mg/dL — ABNORMAL HIGH (ref <1.0)

## 2018-12-19 LAB — PROTIME-INR
INR: 1.1 (ref 0.8–1.2)
Prothrombin Time: 14.2 seconds (ref 11.4–15.2)

## 2018-12-19 LAB — SEDIMENTATION RATE: Sed Rate: 12 mm/hr (ref 0–16)

## 2018-12-19 LAB — LIPASE, BLOOD: Lipase: 20 U/L (ref 11–51)

## 2018-12-19 LAB — LACTIC ACID, PLASMA: Lactic Acid, Venous: 1.9 mmol/L (ref 0.5–1.9)

## 2018-12-19 LAB — SARS CORONAVIRUS 2 BY RT PCR (HOSPITAL ORDER, PERFORMED IN ~~LOC~~ HOSPITAL LAB): SARS Coronavirus 2: NEGATIVE

## 2018-12-19 MED ORDER — SODIUM CHLORIDE 0.9 % IV SOLN
2.0000 g | Freq: Two times a day (BID) | INTRAVENOUS | Status: DC
  Administered 2018-12-19: 2 g via INTRAVENOUS
  Filled 2018-12-19: qty 20
  Filled 2018-12-19: qty 2

## 2018-12-19 MED ORDER — FAMOTIDINE IN NACL 20-0.9 MG/50ML-% IV SOLN
20.0000 mg | Freq: Once | INTRAVENOUS | Status: AC
  Administered 2018-12-19: 09:00:00 20 mg via INTRAVENOUS
  Filled 2018-12-19: qty 50

## 2018-12-19 MED ORDER — ACETAMINOPHEN 325 MG PO TABS
650.0000 mg | ORAL_TABLET | Freq: Four times a day (QID) | ORAL | Status: DC | PRN
  Administered 2018-12-20 – 2018-12-30 (×7): 650 mg via ORAL
  Filled 2018-12-19 (×7): qty 2

## 2018-12-19 MED ORDER — SODIUM CHLORIDE 0.9 % IV SOLN
500.0000 mg | Freq: Once | INTRAVENOUS | Status: AC
  Administered 2018-12-19: 13:00:00 500 mg via INTRAVENOUS
  Filled 2018-12-19: qty 500

## 2018-12-19 MED ORDER — SODIUM CHLORIDE 0.9 % IV SOLN
INTRAVENOUS | Status: DC
  Administered 2018-12-19 – 2018-12-22 (×6): via INTRAVENOUS

## 2018-12-19 MED ORDER — SODIUM CHLORIDE 0.9 % IV BOLUS
1000.0000 mL | Freq: Once | INTRAVENOUS | Status: AC
  Administered 2018-12-19: 09:00:00 1000 mL via INTRAVENOUS

## 2018-12-19 MED ORDER — GADOBUTROL 1 MMOL/ML IV SOLN
8.0000 mL | Freq: Once | INTRAVENOUS | Status: AC | PRN
  Administered 2018-12-19: 15:00:00 8 mL via INTRAVENOUS

## 2018-12-19 MED ORDER — VANCOMYCIN HCL 10 G IV SOLR
1500.0000 mg | Freq: Once | INTRAVENOUS | Status: AC
  Administered 2018-12-19: 12:00:00 1500 mg via INTRAVENOUS
  Filled 2018-12-19: qty 1500

## 2018-12-19 MED ORDER — VANCOMYCIN HCL IN DEXTROSE 750-5 MG/150ML-% IV SOLN
750.0000 mg | Freq: Two times a day (BID) | INTRAVENOUS | Status: DC
  Administered 2018-12-20: 01:00:00 750 mg via INTRAVENOUS
  Filled 2018-12-19: qty 150

## 2018-12-19 MED ORDER — SODIUM CHLORIDE 0.9 % IV SOLN: 250.0000 mL | INTRAVENOUS | Status: DC | PRN

## 2018-12-19 MED ORDER — SODIUM CHLORIDE 0.9% FLUSH
3.0000 mL | Freq: Two times a day (BID) | INTRAVENOUS | Status: DC
  Administered 2018-12-19 – 2019-01-07 (×19): 3 mL via INTRAVENOUS

## 2018-12-19 MED ORDER — SODIUM CHLORIDE 0.9% FLUSH: 3.0000 mL | INTRAVENOUS | Status: DC | PRN

## 2018-12-19 MED ORDER — KETOROLAC TROMETHAMINE 30 MG/ML IJ SOLN: 30.0000 mg | Freq: Four times a day (QID) | INTRAMUSCULAR | Status: DC | PRN

## 2018-12-19 MED ORDER — ENOXAPARIN SODIUM 40 MG/0.4ML ~~LOC~~ SOLN
40.0000 mg | SUBCUTANEOUS | Status: DC
  Administered 2018-12-19 – 2019-01-06 (×17): 40 mg via SUBCUTANEOUS
  Filled 2018-12-19 (×19): qty 0.4

## 2018-12-19 MED ORDER — ONDANSETRON HCL 4 MG/2ML IJ SOLN
4.0000 mg | Freq: Once | INTRAMUSCULAR | Status: AC
  Administered 2018-12-19: 16:00:00 4 mg via INTRAVENOUS
  Filled 2018-12-19: qty 2

## 2018-12-19 MED ORDER — HYDROMORPHONE HCL 1 MG/ML IJ SOLN
1.0000 mg | Freq: Once | INTRAMUSCULAR | Status: AC
  Administered 2018-12-19: 16:00:00 1 mg via INTRAVENOUS
  Filled 2018-12-19: qty 1

## 2018-12-19 MED ORDER — ENOXAPARIN SODIUM 40 MG/0.4ML ~~LOC~~ SOLN: 40.0000 mg | SUBCUTANEOUS | Status: DC

## 2018-12-19 MED ORDER — SODIUM CHLORIDE 0.9% FLUSH
3.0000 mL | Freq: Once | INTRAVENOUS | Status: AC
  Administered 2018-12-19: 09:00:00 3 mL via INTRAVENOUS

## 2018-12-19 MED ORDER — PANTOPRAZOLE SODIUM 40 MG IV SOLR
40.0000 mg | INTRAVENOUS | Status: DC
  Administered 2018-12-19 – 2018-12-21 (×3): 40 mg via INTRAVENOUS
  Filled 2018-12-19 (×3): qty 40

## 2018-12-19 MED ORDER — IOPAMIDOL (ISOVUE-300) INJECTION 61%
80.0000 mL | Freq: Once | INTRAVENOUS | Status: AC | PRN
  Administered 2018-12-19: 10:00:00 80 mL via INTRAVENOUS

## 2018-12-19 MED ORDER — ONDANSETRON HCL 4 MG/2ML IJ SOLN
4.0000 mg | Freq: Four times a day (QID) | INTRAMUSCULAR | Status: DC | PRN
  Administered 2018-12-21: 17:00:00 4 mg via INTRAVENOUS
  Filled 2018-12-19: qty 2

## 2018-12-19 MED ORDER — ONDANSETRON HCL 4 MG/2ML IJ SOLN
4.0000 mg | Freq: Once | INTRAMUSCULAR | Status: AC
  Administered 2018-12-19: 09:00:00 4 mg via INTRAVENOUS
  Filled 2018-12-19: qty 2

## 2018-12-19 MED ORDER — SODIUM CHLORIDE 0.9 % IV SOLN
2.0000 g | Freq: Once | INTRAVENOUS | Status: AC
  Administered 2018-12-19: 2 g via INTRAVENOUS
  Filled 2018-12-19: qty 20

## 2018-12-19 MED ORDER — SODIUM CHLORIDE 0.9 % IV SOLN
500.0000 mg | INTRAVENOUS | Status: DC
  Filled 2018-12-19: qty 500

## 2018-12-19 NOTE — ED Provider Notes (Signed)
[facility] EMERGENCY DEPARTMENT Provider Note   CSN: [id number] Arrival date & time: 12/19/18  0444     History   Chief Complaint Chief Complaint  Patient presents with   Back Pain    HPI Bradley Weber is a 43 y.o. male.     The history is provided by the patient. No language interpreter was used.  Back Pain Location:  Lumbar spine Quality:  Aching Pain severity:  Severe Pain is:  Same all the time Onset quality:  Sudden Timing:  Constant Progression:  Worsening Chronicity:  New Relieved by:  Nothing Worsened by:  Nothing Ineffective treatments:  None tried Associated symptoms: fever   Associated symptoms: no leg pain   Pt complains of severe low back pain.  Pt reports he has been vomiting today.  Pt saw dark blood in vomit.  Pt reports he has had kidney stones in the past.  Pt admits to heroin use 2 days ago,   Past Medical History:  Diagnosis Date   ADD (attention deficit disorder)    Basal cell carcinoma    Chronic headaches    Kidney stone    MRSA (methicillin resistant Staphylococcus aureus)     Patient Active Problem List   Diagnosis Date Noted   Abdominal pain, epigastric 01/20/2011    Past Surgical History:  Procedure Laterality Date   MOUTH SURGERY     MRSA cyst excision     chest   SKIN CANCER EXCISION          Home Medications    Prior to Admission medications   Medication Sig Start Date End Date Taking? Authorizing Provider  amphetamine-dextroamphetamine (ADDERALL) 30 MG tablet Take 75 mg by mouth daily.     [provider]  clonazePAM (KLONOPIN) 1 MG tablet Take 1 mg by mouth 3 (three) times daily as needed.    [provider]  ranitidine (ZANTAC) 150 MG capsule Take 1 capsule (150 mg total) by mouth 2 (two) times daily. 07/12/11 07/11/12  Inda Castle, MD    Family History Family History  Problem Relation Age of Onset   Heart attack Father    Pancreatic cancer Maternal  Grandmother    Diabetes Maternal Grandmother     Social History Social History   Tobacco Use   Smoking status: Current Every Day Smoker   Smokeless tobacco: Never Used  Substance Use Topics   Alcohol use: No    Comment: occasionally   Drug use: Yes    Types: Oxycodone     Allergies   Patient has no known allergies.   Review of Systems Review of Systems  Constitutional: Positive for fever.  Musculoskeletal: Positive for back pain.  All other systems reviewed and are negative.    Physical Exam Updated Vital Signs BP 110/70 (BP Location: Right Arm)    Pulse 92    Temp 97.8 F (36.6 C) (Oral)    Resp (!) 21    Ht 5\' 11"  (1.803 m)    Wt 79.4 kg    SpO2 95%    BMI 24.41 kg/m   Physical Exam Vitals signs and nursing note reviewed.  Constitutional:      Appearance: He is well-developed.  HENT:     Head: Normocephalic and atraumatic.  Eyes:     Conjunctiva/sclera: Conjunctivae normal.  Neck:     Musculoskeletal: Normal range of motion and neck supple.  Cardiovascular:     Rate and Rhythm: Normal rate and regular rhythm.  Heart sounds: No murmur.  Pulmonary:     Effort: Pulmonary effort is normal. No respiratory distress.     Breath sounds: Normal breath sounds.  Abdominal:     Palpations: Abdomen is soft.     Tenderness: There is no abdominal tenderness.  Musculoskeletal:        General: Tenderness present.     Comments: Tender ls spine diffusely   Skin:    General: Skin is warm and dry.  Neurological:     General: No focal deficit present.     Mental Status: He is alert.  Psychiatric:        Mood and Affect: Mood normal.      ED Treatments / Results  Labs (all labs ordered are listed, but only abnormal results are displayed) Labs Reviewed  COMPREHENSIVE METABOLIC PANEL - Abnormal; Notable for the following components:      Result Value   Glucose, Bld 103 (*)    Creatinine, Ser 1.92 (*)    AST 70 (*)    ALT 97 (*)    Total Bilirubin 1.8  (*)    GFR calc non Af Amer 42 (*)    GFR calc Af Amer 48 (*)    All other components within normal limits  CBC - Abnormal; Notable for the following components:   WBC 29.5 (*)    All other components within normal limits  URINALYSIS, ROUTINE W REFLEX MICROSCOPIC - Abnormal; Notable for the following components:   Color, Urine AMBER (*)    APPearance CLOUDY (*)    Glucose, UA 50 (*)    Hgb urine dipstick SMALL (*)    Protein, ur 100 (*)    WBC, UA >50 (*)    Bacteria, UA RARE (*)    Non Squamous Epithelial 0-5 (*)    All other components within normal limits  CULTURE, BLOOD (ROUTINE X 2)  CULTURE, BLOOD (ROUTINE X 2)  SARS CORONAVIRUS 2 (HOSPITAL ORDER, [place] [facility])  LIPASE, BLOOD  LACTIC ACID, PLASMA  SEDIMENTATION RATE    EKG None  Radiology Ct Abdomen Pelvis W Contrast  Result Date: 12/19/2018 CLINICAL DATA:  43 year old male with hematemesis and back pain. EXAM: CT ABDOMEN AND PELVIS WITH CONTRAST TECHNIQUE: Multidetector CT imaging of the abdomen and pelvis was performed using the standard protocol following bolus administration of intravenous contrast. CONTRAST:  75mL ISOVUE-300 IOPAMIDOL (ISOVUE-300) INJECTION 61% COMPARISON:  Lumbar spine CT dated 12/19/2018 . FINDINGS: Lower chest: Confluent airspace density in the left lower lobe and lingula and to a lesser degree in the right middle lobe most consistent with pneumonia. Clinical correlation is recommended. No intra-abdominal free air or free fluid. Hepatobiliary: Probable mild fatty infiltration of the liver. No intrahepatic biliary ductal dilatation. The gallbladder is unremarkable. Pancreas: Unremarkable. No pancreatic ductal dilatation or surrounding inflammatory changes. Spleen: Normal in size without focal abnormality. Adrenals/Urinary Tract: The adrenal glands are unremarkable. The kidneys, visualized ureters, and urinary bladder appear unremarkable as well. Stomach/Bowel: There is moderate  stool throughout the colon. There is no bowel obstruction or active inflammation. The appendix is normal. Vascular/Lymphatic: Mild aortoiliac atherosclerotic disease. The IVC is unremarkable. No portal venous gas. There is no adenopathy. Reproductive: The prostate and seminal vesicles are grossly unremarkable. No pelvic mass. Other: None Musculoskeletal: No acute osseous pathology. Apparent mild haziness of the fat plane at L4-L5 neural foramina and posterior right psoas muscle. This can be. This may be artifactual. If there is clinical concern for an infectious  or inflammatory process, MRI may provide better evaluation. No drainable fluid collection. IMPRESSION: 1. Multifocal pneumonia.  Clinical correlation is recommended. 2. No acute intra-abdominal or pelvic pathology. No bowel obstruction or active inflammation. Normal appendix. 3. Artifact versus mild stranding of the fat at L4-L5. Clinical correlation is recommended. 4. Minimal aortic Atherosclerosis (ICD10-I70.0). Electronically Signed   By: Anner Crete M.D.   On: 12/19/2018 10:34   Ct L-spine No Charge  Result Date: 12/19/2018 CLINICAL DATA:  43 year old male with hematemesis and back pain. EXAM: CT LUMBAR SPINE WITHOUT CONTRAST TECHNIQUE: Multidetector CT imaging of the lumbar spine was performed without intravenous contrast administration. Multiplanar CT image reconstructions were also generated. COMPARISON:  CT of the abdomen pelvis dated 9320 FINDINGS: Segmentation: 5 lumbar type vertebrae. Alignment: Normal. Vertebrae: No acute fracture or focal pathologic process. Paraspinal and other soft tissues: No fluid collection. There is apparent mild haziness of the fat plane primarily at L4-L5 involving the neural foramina and posterior aspect of the right psoas muscle. If there is clinical concern for an infectious or inflammatory process, MRI may provide better evaluation. Disc levels: No degenerative changes. The neural foramina are patent.  IMPRESSION: 1. No acute/traumatic lumbar spine pathology. 2. Apparent mild haziness of the fat plane primarily at L4-L5 involving the neural foramina and posterior aspect of the right psoas muscle. This may be artifactual. If there is clinical concern for an infectious or inflammatory process, MRI may provide better evaluation. Electronically Signed   By: Anner Crete M.D.   On: 12/19/2018 10:34    Procedures .Critical Care Performed by: Fransico Meadow, PA-C Authorized by: Fransico Meadow, PA-C   Critical care provider statement:    Critical care time (minutes):  45   Critical care start time:  12/19/2018 9:00 AM   Critical care end time:  12/19/2018 4:08 PM   Critical care time was exclusive of:  Separately billable procedures and treating other patients   Critical care was necessary to treat or prevent imminent or life-threatening deterioration of the following conditions:  Dehydration   Critical care was time spent personally by me on the following activities:  Blood draw for specimens, development of treatment plan with patient or surrogate, discussions with consultants, evaluation of patient's response to treatment, examination of patient, interpretation of cardiac output measurements, ordering and performing treatments and interventions, ordering and review of laboratory studies, ordering and review of radiographic studies, pulse oximetry and re-evaluation of patient's condition   (including critical care time)  Medications Ordered in ED Medications  vancomycin (VANCOCIN) 1,500 mg in sodium chloride 0.9 % 500 mL IVPB (has no administration in time range)  cefTRIAXone (ROCEPHIN) 2 g in sodium chloride 0.9 % 100 mL IVPB (has no administration in time range)  azithromycin (ZITHROMAX) 500 mg in sodium chloride 0.9 % 250 mL IVPB (has no administration in time range)  sodium chloride flush (NS) 0.9 % injection 3 mL (3 mLs Intravenous Given 12/19/18 0916)  sodium chloride 0.9 % bolus 1,000 mL  (1,000 mLs Intravenous New Bag/Given 12/19/18 0916)  famotidine (PEPCID) IVPB 20 mg premix (0 mg Intravenous Stopped 12/19/18 1003)  ondansetron (ZOFRAN) injection 4 mg (4 mg Intravenous Given 12/19/18 0916)  iopamidol (ISOVUE-300) 61 % injection 80 mL (80 mLs Intravenous Contrast Given 12/19/18 1018)     Initial Impression / Assessment and Plan / ED Course  I have reviewed the triage vital signs and the nursing notes.  Pertinent labs & imaging results that were available during my care  of the patient were reviewed by me and considered in my medical decision making (see chart for details).        MDM  Pt has elevated wbc count to 25.  Ct scan shows multifocal pneumonia.  Possible  Abscess ls spine  Pt has a wbc count of 29.5  Lactic acid is normal.  Sed rate is normal  MRI obtained.  MRI shows facet inflammation and 2 abscesses.   IV antibiotics dosed by Pharmacy Neurosurgeon consulted  Dr. Ellene Route will see and evaluate.  He request medicine to admit  Covid is negative, Blood cultures obtained pre antibiotics and are pending.  I spoke to Hospitalist Dr. Doristine Bosworth who will see for admission Final Clinical Impressions(s) / ED Diagnoses   Final diagnoses:  Pain  Community acquired pneumonia, unspecified laterality  Spinal epidural abscess    ED Discharge Orders    None       Fransico Meadow, [place] 12/19/18 1641    Daleen Bo, MD 12/21/18 [phone]

## 2018-12-19 NOTE — ED Notes (Signed)
ED TO INPATIENT HANDOFF REPORT  ED Nurse Name and Phone #: Sherrine Maples [phone]  S Name/Age/Gender [name] 43 y.o. male Room/Bed: 042C/042C  Code Status   Code Status: Full Code  Home/SNF/Other Home Patient oriented to: self, place, time and situation Is this baseline? Yes   Triage Complete: Triage complete  Chief Complaint severe back pain/coughing blood  Triage Note Patient with severe back pain that has been going on for about 2 days.  Patient states that he does have a history of kidney stones.  Patient states that he also has been vomiting dark red blood this morning.     Allergies No Known Allergies  Level of Care/Admitting Diagnosis ED Disposition    ED Disposition Condition Comment   Admit  Hospital Area: [place] [[id number]]  Level of Care: Progressive [102]  Covid Evaluation: Confirmed COVID Negative  Diagnosis: Pneumonia [id number]  Admitting Physician: Mckinley Jewel [id number]  Attending Physician: Mckinley Jewel [phone]  Estimated length of stay: 3 - 4 days  Certification:: I certify this patient will need inpatient services for at least 2 midnights  PT Class (Do Not Modify): Inpatient [101]  PT Acc Code (Do Not Modify): Private [1]       B Medical/Surgery History Past Medical History:  Diagnosis Date  . ADD (attention deficit disorder)   . Basal cell carcinoma   . Chronic headaches   . Kidney stone   . MRSA (methicillin resistant Staphylococcus aureus)    Past Surgical History:  Procedure Laterality Date  . MOUTH SURGERY    . MRSA cyst excision     chest  . SKIN CANCER EXCISION       A IV Location/Drains/Wounds Patient Lines/Drains/Airways Status   Active Line/Drains/Airways    Name:   Placement date:   Placement time:   Site:   Days:   Peripheral IV 12/19/18 Right Antecubital   12/19/18    0915    Antecubital   less than 1          Intake/Output Last 24 hours  Intake/Output Summary (Last 24 hours) at  12/19/2018 1944 Last data filed at 12/19/2018 1003 Gross per 24 hour  Intake 78.33 ml  Output -  Net 78.33 ml    Labs/Imaging Results for orders placed or performed during the hospital encounter of 12/19/18 (from the past 48 hour(s))  Urinalysis, Routine w reflex microscopic     Status: Abnormal   Collection Time: 12/19/18  5:24 AM  Result Value Ref Range   Color, Urine AMBER (A) YELLOW    Comment: BIOCHEMICALS MAY BE AFFECTED BY COLOR   APPearance CLOUDY (A) CLEAR   Specific Gravity, Urine 1.024 1.005 - 1.030   pH 5.0 5.0 - 8.0   Glucose, UA 50 (A) NEGATIVE mg/dL   Hgb urine dipstick SMALL (A) NEGATIVE   Bilirubin Urine NEGATIVE NEGATIVE   Ketones, ur NEGATIVE NEGATIVE mg/dL   Protein, ur 100 (A) NEGATIVE mg/dL   Nitrite NEGATIVE NEGATIVE   Leukocytes,Ua NEGATIVE NEGATIVE   RBC / HPF 0-5 0 - 5 RBC/hpf   WBC, UA >50 (H) 0 - 5 WBC/hpf   Bacteria, UA RARE (A) NONE SEEN   Squamous Epithelial / LPF 0-5 0 - 5   WBC Clumps PRESENT    Mucus PRESENT    Hyaline Casts, UA PRESENT    Granular Casts, UA PRESENT    Urine-Other WBC CASTS PRESENT    Non Squamous Epithelial 0-5 (A) NONE SEEN  Comment: Performed at [facility], [place] [address] [place], [place] [address]  Lipase, blood     Status: None   Collection Time: 12/19/18  5:30 AM  Result Value Ref Range   Lipase 20 11 - 51 U/L    Comment: Performed at [facility] [address] [place], [place] [address]  Comprehensive metabolic panel     Status: Abnormal   Collection Time: 12/19/18  5:30 AM  Result Value Ref Range   Sodium 137 135 - 145 mmol/L   Potassium 3.7 3.5 - 5.1 mmol/L   Chloride 98 98 - 111 mmol/L   CO2 25 22 - 32 mmol/L   Glucose, Bld 103 (H) 70 - 99 mg/dL   BUN 16 6 - 20 mg/dL   Creatinine, Ser 1.92 (H) 0.61 - 1.24 mg/dL   Calcium 9.1 8.9 - 10.3 mg/dL   Total Protein 7.4 6.5 - 8.1 g/dL   Albumin 3.6 3.5 - 5.0 g/dL   AST 70 (H) 15 - 41 U/L   ALT 97 (H) 0 - 44 U/L   Alkaline Phosphatase 112 38 -  126 U/L   Total Bilirubin 1.8 (H) 0.3 - 1.2 mg/dL   GFR calc non Af Amer 42 (L) >60 mL/min   GFR calc Af Amer 48 (L) >60 mL/min   Anion gap 14 5 - 15    Comment: Performed at [facility] [address] [place], [place] [address]  CBC     Status: Abnormal   Collection Time: 12/19/18  5:30 AM  Result Value Ref Range   WBC 29.5 (H) 4.0 - 10.5 K/uL   RBC 5.49 4.22 - 5.81 MIL/uL   Hemoglobin 16.0 13.0 - 17.0 g/dL   HCT 46.9 39.0 - 52.0 %   MCV 85.4 80.0 - 100.0 fL   MCH 29.1 26.0 - 34.0 pg   MCHC 34.1 30.0 - 36.0 g/dL   RDW 12.5 11.5 - 15.5 %   Platelets 205 150 - 400 K/uL   nRBC 0.0 0.0 - 0.2 %    Comment: Performed at [facility], [place] [address] [place], [place] [address]  Sedimentation rate     Status: None   Collection Time: 12/19/18  5:30 AM  Result Value Ref Range   Sed Rate 12 0 - 16 mm/hr    Comment: Performed at [facility] [address] [place], [place] [address]  Lactic acid, plasma     Status: None   Collection Time: 12/19/18  8:55 AM  Result Value Ref Range   Lactic Acid, Venous 1.9 0.5 - 1.9 mmol/L    Comment: Performed at [facility] [address], [place], [place] [address]  SARS Coronavirus 2 [facility] order, Performed in [facility] hospital lab) Nasopharyngeal Nasopharyngeal Swab     Status: None   Collection Time: 12/19/18 11:04 AM   Specimen: Nasopharyngeal Swab  Result Value Ref Range   SARS Coronavirus 2 NEGATIVE NEGATIVE    Comment: (NOTE) If result is NEGATIVE SARS-CoV-2 target nucleic acids are NOT DETECTED. The SARS-CoV-2 RNA is generally detectable in upper and lower  respiratory specimens during the acute phase of infection. The lowest  concentration of SARS-CoV-2 viral copies this assay can detect is 250  copies / mL. A negative result does not preclude SARS-CoV-2 infection  and should not be used as the sole basis for treatment or other  patient management decisions.  A negative result may occur  with  improper  specimen collection / handling, submission of specimen other  than nasopharyngeal swab, presence of viral mutation(s) within the  areas targeted by this assay, and inadequate number of viral copies  (<250 copies / mL). A negative result must be combined with clinical  observations, patient history, and epidemiological information. If result is POSITIVE SARS-CoV-2 target nucleic acids are DETECTED. The SARS-CoV-2 RNA is generally detectable in upper and lower  respiratory specimens dur ing the acute phase of infection.  Positive  results are indicative of active infection with SARS-CoV-2.  Clinical  correlation with patient history and other diagnostic information is  necessary to determine patient infection status.  Positive results do  not rule out bacterial infection or co-infection with other viruses. If result is PRESUMPTIVE POSTIVE SARS-CoV-2 nucleic acids MAY BE PRESENT.   A presumptive positive result was obtained on the submitted specimen  and confirmed on repeat testing.  While 2019 novel coronavirus  (SARS-CoV-2) nucleic acids may be present in the submitted sample  additional confirmatory testing may be necessary for epidemiological  and / or clinical management purposes  to differentiate between  SARS-CoV-2 and other Sarbecovirus currently known to infect humans.  If clinically indicated additional testing with an alternate test  methodology [phone]) is advised. The SARS-CoV-2 RNA is generally  detectable in upper and lower respiratory sp ecimens during the acute  phase of infection. The expected result is Negative. Fact Sheet for Patients:  StrictlyIdeas.no Fact Sheet for Healthcare Providers: BankingDealers.co.za This test is not yet approved or cleared by the [place] FDA and has been authorized for detection and/or diagnosis of SARS-CoV-2 by FDA under an Emergency Use Authorization (EUA).  This EUA will remain in  effect (meaning this test can be used) for the duration of the COVID-19 declaration under Section 564(b)(1) of the Act, 21 U.S.C. section 360bbb-3(b)(1), unless the authorization is terminated or revoked sooner. Performed at [facility], [place] [address], [place], [place] [address]    Mr Thoracic Spine W Wo Contrast  Result Date: 12/19/2018 CLINICAL DATA:  Severe thoracic and lumbar spine pain for 2 days. Possible abnormality at L4-5 on prior CT abdomen and pelvis. EXAM: MRI THORACIC AND LUMBAR SPINE WITHOUT AND WITH CONTRAST TECHNIQUE: Multiplanar and multiecho pulse sequences of the thoracic and lumbar spine were obtained without and with intravenous contrast. CONTRAST:  8 mL Gadavist IV. COMPARISON:  CT abdomen and pelvis 12/19/2018. FINDINGS: MRI THORACIC SPINE FINDINGS Alignment:  Normal. Vertebrae: No fracture, evidence of discitis, or bone lesion. Cord:  Normal signal and morphology. Paraspinal and other soft tissues: Extensive airspace disease in the left chest is identified. There appears to be patchy airspace disease on the right. Disc levels: The central canal and foramina are widely patent at all levels. No disc bulge or protrusion. MRI LUMBAR SPINE FINDINGS Segmentation:  Standard. Alignment:  Maintained. Vertebrae: No fracture or evidence of discitis or osteomyelitis. However, there is edema and enhancement and posterior paraspinous musculature bilaterally but much worse on the right in the multifidus. Inflammatory change is centered about the right L4-5 facet joint. An abscess extending out of the medial margin of the L4-5 facets into the epidural space measures 0.5 cm transverse by 0.9 cm AP by approximately 2.4 cm craniocaudal. The abscess results in mass effect on the thecal sac which is deflected to the left and deformed. A second smaller abscess is seen subjacent to the right L4 pedicle and lamina measuring 0.8 cm AP x 0.3 cm transverse  by approximately 0.8 cm craniocaudal. There is  only mild mass effect on the right side of the thecal sac secondary to this collection. A small rim enhancing fluid collection emanates posteriorly out of the right L4-5 facet measures 0.8 cm transverse by 0.9 cm AP by 1.5 cm craniocaudal. Conus medullaris: Extends to the L1 level and appears normal. Paraspinal and other soft tissues: As described above. Disc levels: T12-L1 to L3-4 are negative. L4-5: Shallow disc bulge and moderate facet arthropathy. L5-S1: Negative. IMPRESSION: The examination is positive for a septic facet effusion on the right at L4-5 resulting in 2 epidural abscesses. The larger collection emanates from the medial margin of the right L4-5 facet and results in mass effect on the thecal sac which is deflected to the left and narrowed. A second smaller abscess is seen superiorly at the level of the L4 pedicles which causes minimal mass effect on the thecal sac. A small abscess extending posteriorly out of the L4-5 facet is also noted. Extensive inflammatory change in posterior paraspinous musculature is worse on the right in the multi tip at this and consistent with myositis. No intramuscular abscess is identified. Normal appearing thoracic spine. Left worse than right airspace disease consistent with pneumonia. These results were called by telephone at the time of interpretation on 12/19/2018 at 3:26 pm to [facility] who verbally acknowledged these results. Electronically Signed   By: Inge Rise M.D.   On: 12/19/2018 15:28   Mr Lumbar Spine W Wo Contrast  Result Date: 12/19/2018 CLINICAL DATA:  Severe thoracic and lumbar spine pain for 2 days. Possible abnormality at L4-5 on prior CT abdomen and pelvis. EXAM: MRI THORACIC AND LUMBAR SPINE WITHOUT AND WITH CONTRAST TECHNIQUE: Multiplanar and multiecho pulse sequences of the thoracic and lumbar spine were obtained without and with intravenous contrast. CONTRAST:  8 mL Gadavist IV. COMPARISON:  CT abdomen and pelvis 12/19/2018.  FINDINGS: MRI THORACIC SPINE FINDINGS Alignment:  Normal. Vertebrae: No fracture, evidence of discitis, or bone lesion. Cord:  Normal signal and morphology. Paraspinal and other soft tissues: Extensive airspace disease in the left chest is identified. There appears to be patchy airspace disease on the right. Disc levels: The central canal and foramina are widely patent at all levels. No disc bulge or protrusion. MRI LUMBAR SPINE FINDINGS Segmentation:  Standard. Alignment:  Maintained. Vertebrae: No fracture or evidence of discitis or osteomyelitis. However, there is edema and enhancement and posterior paraspinous musculature bilaterally but much worse on the right in the multifidus. Inflammatory change is centered about the right L4-5 facet joint. An abscess extending out of the medial margin of the L4-5 facets into the epidural space measures 0.5 cm transverse by 0.9 cm AP by approximately 2.4 cm craniocaudal. The abscess results in mass effect on the thecal sac which is deflected to the left and deformed. A second smaller abscess is seen subjacent to the right L4 pedicle and lamina measuring 0.8 cm AP x 0.3 cm transverse by approximately 0.8 cm craniocaudal. There is only mild mass effect on the right side of the thecal sac secondary to this collection. A small rim enhancing fluid collection emanates posteriorly out of the right L4-5 facet measures 0.8 cm transverse by 0.9 cm AP by 1.5 cm craniocaudal. Conus medullaris: Extends to the L1 level and appears normal. Paraspinal and other soft tissues: As described above. Disc levels: T12-L1 to L3-4 are negative. L4-5: Shallow disc bulge and moderate facet arthropathy. L5-S1: Negative. IMPRESSION: The examination is positive for  a septic facet effusion on the right at L4-5 resulting in 2 epidural abscesses. The larger collection emanates from the medial margin of the right L4-5 facet and results in mass effect on the thecal sac which is deflected to the left and  narrowed. A second smaller abscess is seen superiorly at the level of the L4 pedicles which causes minimal mass effect on the thecal sac. A small abscess extending posteriorly out of the L4-5 facet is also noted. Extensive inflammatory change in posterior paraspinous musculature is worse on the right in the multi tip at this and consistent with myositis. No intramuscular abscess is identified. Normal appearing thoracic spine. Left worse than right airspace disease consistent with pneumonia. These results were called by telephone at the time of interpretation on 12/19/2018 at 3:26 pm to [facility] who verbally acknowledged these results. Electronically Signed   By: Inge Rise M.D.   On: 12/19/2018 15:28   Ct Abdomen Pelvis W Contrast  Result Date: 12/19/2018 CLINICAL DATA:  43 year old male with hematemesis and back pain. EXAM: CT ABDOMEN AND PELVIS WITH CONTRAST TECHNIQUE: Multidetector CT imaging of the abdomen and pelvis was performed using the standard protocol following bolus administration of intravenous contrast. CONTRAST:  74mL ISOVUE-300 IOPAMIDOL (ISOVUE-300) INJECTION 61% COMPARISON:  Lumbar spine CT dated 12/19/2018 . FINDINGS: Lower chest: Confluent airspace density in the left lower lobe and lingula and to a lesser degree in the right middle lobe most consistent with pneumonia. Clinical correlation is recommended. No intra-abdominal free air or free fluid. Hepatobiliary: Probable mild fatty infiltration of the liver. No intrahepatic biliary ductal dilatation. The gallbladder is unremarkable. Pancreas: Unremarkable. No pancreatic ductal dilatation or surrounding inflammatory changes. Spleen: Normal in size without focal abnormality. Adrenals/Urinary Tract: The adrenal glands are unremarkable. The kidneys, visualized ureters, and urinary bladder appear unremarkable as well. Stomach/Bowel: There is moderate stool throughout the colon. There is no bowel obstruction or active inflammation. The  appendix is normal. Vascular/Lymphatic: Mild aortoiliac atherosclerotic disease. The IVC is unremarkable. No portal venous gas. There is no adenopathy. Reproductive: The prostate and seminal vesicles are grossly unremarkable. No pelvic mass. Other: None Musculoskeletal: No acute osseous pathology. Apparent mild haziness of the fat plane at L4-L5 neural foramina and posterior right psoas muscle. This can be. This may be artifactual. If there is clinical concern for an infectious or inflammatory process, MRI may provide better evaluation. No drainable fluid collection. IMPRESSION: 1. Multifocal pneumonia.  Clinical correlation is recommended. 2. No acute intra-abdominal or pelvic pathology. No bowel obstruction or active inflammation. Normal appendix. 3. Artifact versus mild stranding of the fat at L4-L5. Clinical correlation is recommended. 4. Minimal aortic Atherosclerosis (ICD10-I70.0). Electronically Signed   By: Anner Crete M.D.   On: 12/19/2018 10:34   Ct L-spine No Charge  Result Date: 12/19/2018 CLINICAL DATA:  43 year old male with hematemesis and back pain. EXAM: CT LUMBAR SPINE WITHOUT CONTRAST TECHNIQUE: Multidetector CT imaging of the lumbar spine was performed without intravenous contrast administration. Multiplanar CT image reconstructions were also generated. COMPARISON:  CT of the abdomen pelvis dated 9320 FINDINGS: Segmentation: 5 lumbar type vertebrae. Alignment: Normal. Vertebrae: No acute fracture or focal pathologic process. Paraspinal and other soft tissues: No fluid collection. There is apparent mild haziness of the fat plane primarily at L4-L5 involving the neural foramina and posterior aspect of the right psoas muscle. If there is clinical concern for an infectious or inflammatory process, MRI may provide better evaluation. Disc levels: No degenerative changes. The neural foramina  are patent. IMPRESSION: 1. No acute/traumatic lumbar spine pathology. 2. Apparent mild haziness of the  fat plane primarily at L4-L5 involving the neural foramina and posterior aspect of the right psoas muscle. This may be artifactual. If there is clinical concern for an infectious or inflammatory process, MRI may provide better evaluation. Electronically Signed   By: Anner Crete M.D.   On: 12/19/2018 10:34   Dg Chest Port 1 View  Result Date: 12/19/2018 CLINICAL DATA:  Pneumonia. EXAM: PORTABLE CHEST 1 VIEW COMPARISON:  None. FINDINGS: The heart size and mediastinal contours are within normal limits. No pneumothorax or pleural effusion is noted. Right lung is clear. Left midlung and basilar opacity is noted consistent with pneumonia. The visualized skeletal structures are unremarkable. IMPRESSION: Large left-sided pneumonia. Followup PA and lateral chest X-ray is recommended in 3-4 weeks following trial of antibiotic therapy to ensure resolution and exclude underlying malignancy. Electronically Signed   By: Marijo Conception M.D.   On: 12/19/2018 12:15    Pending Labs Unresulted Labs (From admission, onward)    Start     Ordered   12/26/18 0500  Creatinine, serum  (enoxaparin (LOVENOX)    CrCl >/= 30 ml/min)  Weekly,   R    Comments: while on enoxaparin therapy    12/19/18 1714   12/20/18 0500  Comprehensive metabolic panel  Tomorrow morning,   R     12/19/18 1714   12/20/18 0500  CBC WITH DIFFERENTIAL  Tomorrow morning,   R     12/19/18 1714   12/19/18 1820  Hepatitis panel, acute  Once,   STAT     12/19/18 1820   12/19/18 1728  C-reactive protein  Once,   STAT     12/19/18 1727   12/19/18 1726  Urine rapid drug screen (hosp performed)  ONCE - STAT,   STAT     12/19/18 1725   12/19/18 1714  Legionella Pneumophila Serogp 1 Ur Ag  Once,   STAT     12/19/18 1714   12/19/18 1714  Strep pneumoniae urinary antigen  Once,   STAT     12/19/18 1714   12/19/18 1713  Magnesium  Once,   STAT     12/19/18 1714   12/19/18 1713  Protime-INR  Once,   STAT     12/19/18 1714   12/19/18 1709  HIV  antibody (Routine Screening)  Once,   STAT     12/19/18 1714   12/19/18 1709  CBC  (enoxaparin (LOVENOX)    CrCl >/= 30 ml/min)  Once,   STAT    Comments: Baseline for enoxaparin therapy IF NOT ALREADY DRAWN.  Notify MD if PLT < 100 K.    12/19/18 1714   12/19/18 1709  Creatinine, serum  (enoxaparin (LOVENOX)    CrCl >/= 30 ml/min)  Once,   STAT    Comments: Baseline for enoxaparin therapy IF NOT ALREADY DRAWN.    12/19/18 1714   12/19/18 0856  Blood culture (routine x 2)  BLOOD CULTURE X 2,   STAT     12/19/18 0856          Vitals/Pain Today's Vitals   12/19/18 1815 12/19/18 1845 12/19/18 1940 12/19/18 1943  BP: 121/76 124/72 129/71   Pulse: (!) 123 (!) 120 (!) 125   Resp: (!) 34 (!) 33 (!) 24   Temp:      TempSrc:      SpO2: 94% 94% 94%   Weight:  Height:      PainSc:    7     Isolation Precautions No active isolations  Medications Medications  enoxaparin (LOVENOX) injection 40 mg (0 mg Subcutaneous Hold 12/19/18 1852)  0.9 %  sodium chloride infusion ( Intravenous New Bag/Given 12/19/18 1940)  sodium chloride flush (NS) 0.9 % injection 3 mL (has no administration in time range)  sodium chloride flush (NS) 0.9 % injection 3 mL (has no administration in time range)  0.9 %  sodium chloride infusion (has no administration in time range)  acetaminophen (TYLENOL) tablet 650 mg (has no administration in time range)  ondansetron (ZOFRAN) injection 4 mg (has no administration in time range)  pantoprazole (PROTONIX) injection 40 mg (40 mg Intravenous Given 12/19/18 1940)  vancomycin (VANCOCIN) IVPB 750 mg/150 ml premix (has no administration in time range)  azithromycin (ZITHROMAX) 500 mg in sodium chloride 0.9 % 250 mL IVPB (has no administration in time range)  cefTRIAXone (ROCEPHIN) 2 g in sodium chloride 0.9 % 100 mL IVPB (has no administration in time range)  sodium chloride flush (NS) 0.9 % injection 3 mL (3 mLs Intravenous Given 12/19/18 0916)  sodium chloride 0.9 % bolus  1,000 mL (0 mLs Intravenous Stopped 12/19/18 1153)  famotidine (PEPCID) IVPB 20 mg premix (0 mg Intravenous Stopped 12/19/18 1003)  ondansetron (ZOFRAN) injection 4 mg (4 mg Intravenous Given 12/19/18 0916)  iopamidol (ISOVUE-300) 61 % injection 80 mL (80 mLs Intravenous Contrast Given 12/19/18 1018)  vancomycin (VANCOCIN) 1,500 mg in sodium chloride 0.9 % 500 mL IVPB (0 mg Intravenous Stopped 12/19/18 1723)  cefTRIAXone (ROCEPHIN) 2 g in sodium chloride 0.9 % 100 mL IVPB (0 g Intravenous Stopped 12/19/18 1226)  azithromycin (ZITHROMAX) 500 mg in sodium chloride 0.9 % 250 mL IVPB (0 mg Intravenous Stopped 12/19/18 1610)  gadobutrol (GADAVIST) 1 MMOL/ML injection 8 mL (8 mLs Intravenous Contrast Given 12/19/18 1451)  HYDROmorphone (DILAUDID) injection 1 mg (1 mg Intravenous Given 12/19/18 1608)  ondansetron (ZOFRAN) injection 4 mg (4 mg Intravenous Given 12/19/18 1608)    Mobility walks Low fall risk   Focused Assessments Pulmonary Assessment Handoff:  Lung sounds:   O2 Device: Room Air        R Recommendations: See Admitting Provider Note  Report given to:   Additional Notes:

## 2018-12-19 NOTE — Progress Notes (Addendum)
Patient Mews score 4, for high respirations, high heart rate. Heart rate has been consistently 120s since admission to ED. Heart rate at present 113.No c/o shortness of breath. No c/o difficulty breathing. Appears relaxed. 02 at 93% on room air. Will continue to observe.   0205- Mews remains at a score of 4. Unchanged. Heart rate 100-103. Continue COWS q 4 hours. No acute changes at this time. RR- 18-24 at this time. Sats 93% on room air.

## 2018-12-19 NOTE — H&P (Signed)
History and Physical    Bradley Weber [id number] DOB: 1975/04/28 DOA: 12/19/2018  PCP: No PCP Patient coming from: Home I have personally briefly reviewed patient's old medical records in [place]  Chief Complaint: Severe back pain since 1-1/2-week  HPI: Bradley Weber is a 43 y.o. male with medical history significant of basal cell carcinoma of skin (forehead and back), IV heroin abuse presents to emergency department due to worsening severe lower back pain since 1-1/2-week.  Patient reports that his pain started suddenly, 10 out of 10, nonradiating, denies association with numbness tingling sensation in bilateral lower extremities, tripped and fell yesterday, he has been taking over-the-counter ibuprofen with little help.  Reports that he has been using IV heroin for long time.  Last dose was 2 days ago.  Reports productive cough, fever, chills, decreased appetite, weight loss, shortness of breath since few days.  Denies wheezing, chest pain lightheadedness, dizziness.  Reports 6 episodes of bloody vomiting this morning, headache, blurry vision, weakness and lethargy.  Denies previous history of hematemesis, melena, family history of colon cancer, change in bowel habits.  Reports smoking 1 pack/day since 20 years, denies alcohol use, uses heroin and methamphetamine.   ED Course: Sitting comfortably on the bed, sleepy but arousable, alert and oriented x3.  Reports lower back pain 6 out of 10.  Chest x-ray came back positive for pneumonia, MRI of lumbar spine shows epidural abscess-COVID-19 negative. received IV Rocephin, vancomycin and azithromycin.  Neurosurgery consulted for further evaluation and management.  Review of Systems: As per HPI otherwise negative.    Past Medical History:  Diagnosis Date   ADD (attention deficit disorder)    Basal cell carcinoma    Chronic headaches    Kidney stone    MRSA (methicillin resistant Staphylococcus aureus)     Past Surgical  History:  Procedure Laterality Date   MOUTH SURGERY     MRSA cyst excision     chest   SKIN CANCER EXCISION       reports that he has been smoking. He has never used smokeless tobacco. He reports current drug use. Drug: Oxycodone. He reports that he does not drink alcohol.  No Known Allergies  Family History  Problem Relation Age of Onset   Heart attack Father    Pancreatic cancer Maternal Grandmother    Diabetes Maternal Grandmother     Prior to Admission medications   Medication Sig Start Date End Date Taking? Authorizing Provider  ibuprofen (ADVIL) 200 MG tablet Take 200 mg by mouth every 6 (six) hours as needed for moderate pain.   Yes [provider]    Physical Exam: Vitals:   12/19/18 1607 12/19/18 1630 12/19/18 1645 12/19/18 1700  BP: 109/74 121/75 (!) 144/90 136/84  Pulse: (!) 123 (!) 109  (!) 109  Resp:  (!) 26 15 (!) 30  Temp:      TempSrc:      SpO2: 92% 93%  93%  Weight:      Height:        Constitutional: NAD, calm, comfortable Vitals:   12/19/18 1607 12/19/18 1630 12/19/18 1645 12/19/18 1700  BP: 109/74 121/75 (!) 144/90 136/84  Pulse: (!) 123 (!) 109  (!) 109  Resp:  (!) 26 15 (!) 30  Temp:      TempSrc:      SpO2: 92% 93%  93%  Weight:      Height:       General: Sleepy but arousable.  Eyes: PERRL, lids and conjunctivae normal ENMT: Mucous membranes are moist. Posterior pharynx clear of any exudate or lesions.Normal dentition.  Neck: normal, supple, no masses, no thyromegaly Respiratory: clear to auscultation bilaterally, no wheezing, no crackles. Normal respiratory effort. No accessory muscle use.  Cardiovascular: Regular rate and rhythm, no murmurs / rubs / gallops. No extremity edema. 2+ pedal pulses. No carotid bruits.  Abdomen: no tenderness, no masses palpated. No hepatosplenomegaly. Bowel sounds positive.  Musculoskeletal: no clubbing / cyanosis. No joint deformity upper and lower extremities.  Right-sided back tenderness  positive, no swelling, no redness noted. Skin: small rash noted on forehead and right side of upper back Neurologic: CN 2-12 grossly intact. Sensation intact, DTR normal. Strength 5/5 in all 4.  Psychiatric: Sleepy but arousable, normal judgment and insight. Alert and oriented x 3. Normal mood.    Labs on Admission: I have personally reviewed following labs and imaging studies  CBC: Recent Labs  Lab 12/19/18 0530  WBC 29.5*  HGB 16.0  HCT 46.9  MCV 85.4  PLT 99991111   Basic Metabolic Panel: Recent Labs  Lab 12/19/18 0530  NA 137  K 3.7  CL 98  CO2 25  GLUCOSE 103*  BUN 16  CREATININE 1.92*  CALCIUM 9.1   GFR: Estimated Creatinine Clearance: 52.8 mL/min (A) (by C-G formula based on SCr of 1.92 mg/dL (H)). Liver Function Tests: Recent Labs  Lab 12/19/18 0530  AST 70*  ALT 97*  ALKPHOS 112  BILITOT 1.8*  PROT 7.4  ALBUMIN 3.6   Recent Labs  Lab 12/19/18 0530  LIPASE 20   No results for input(s): AMMONIA in the last 168 hours. Coagulation Profile: No results for input(s): INR, PROTIME in the last 168 hours. Cardiac Enzymes: No results for input(s): CKTOTAL, CKMB, CKMBINDEX, TROPONINI in the last 168 hours. BNP (last 3 results) No results for input(s): PROBNP in the last 8760 hours. HbA1C: No results for input(s): HGBA1C in the last 72 hours. CBG: No results for input(s): GLUCAP in the last 168 hours. Lipid Profile: No results for input(s): CHOL, HDL, LDLCALC, TRIG, CHOLHDL, LDLDIRECT in the last 72 hours. Thyroid Function Tests: No results for input(s): TSH, T4TOTAL, FREET4, T3FREE, THYROIDAB in the last 72 hours. Anemia Panel: No results for input(s): VITAMINB12, FOLATE, FERRITIN, TIBC, IRON, RETICCTPCT in the last 72 hours. Urine analysis:    Component Value Date/Time   COLORURINE AMBER (A) 12/19/2018 0524   APPEARANCEUR CLOUDY (A) 12/19/2018 0524   LABSPEC 1.024 12/19/2018 0524   PHURINE 5.0 12/19/2018 0524   GLUCOSEU 50 (A) 12/19/2018 0524   HGBUR  SMALL (A) 12/19/2018 0524   BILIRUBINUR NEGATIVE 12/19/2018 0524   KETONESUR NEGATIVE 12/19/2018 0524   PROTEINUR 100 (A) 12/19/2018 0524   NITRITE NEGATIVE 12/19/2018 0524   LEUKOCYTESUR NEGATIVE 12/19/2018 0524    Radiological Exams on Admission: Mr Thoracic Spine W Wo Contrast  Result Date: 12/19/2018 CLINICAL DATA:  Severe thoracic and lumbar spine pain for 2 days. Possible abnormality at L4-5 on prior CT abdomen and pelvis. EXAM: MRI THORACIC AND LUMBAR SPINE WITHOUT AND WITH CONTRAST TECHNIQUE: Multiplanar and multiecho pulse sequences of the thoracic and lumbar spine were obtained without and with intravenous contrast. CONTRAST:  8 mL Gadavist IV. COMPARISON:  CT abdomen and pelvis 12/19/2018. FINDINGS: MRI THORACIC SPINE FINDINGS Alignment:  Normal. Vertebrae: No fracture, evidence of discitis, or bone lesion. Cord:  Normal signal and morphology. Paraspinal and other soft tissues: Extensive airspace disease in the left chest is identified. There appears  to be patchy airspace disease on the right. Disc levels: The central canal and foramina are widely patent at all levels. No disc bulge or protrusion. MRI LUMBAR SPINE FINDINGS Segmentation:  Standard. Alignment:  Maintained. Vertebrae: No fracture or evidence of discitis or osteomyelitis. However, there is edema and enhancement and posterior paraspinous musculature bilaterally but much worse on the right in the multifidus. Inflammatory change is centered about the right L4-5 facet joint. An abscess extending out of the medial margin of the L4-5 facets into the epidural space measures 0.5 cm transverse by 0.9 cm AP by approximately 2.4 cm craniocaudal. The abscess results in mass effect on the thecal sac which is deflected to the left and deformed. A second smaller abscess is seen subjacent to the right L4 pedicle and lamina measuring 0.8 cm AP x 0.3 cm transverse by approximately 0.8 cm craniocaudal. There is only mild mass effect on the right side  of the thecal sac secondary to this collection. A small rim enhancing fluid collection emanates posteriorly out of the right L4-5 facet measures 0.8 cm transverse by 0.9 cm AP by 1.5 cm craniocaudal. Conus medullaris: Extends to the L1 level and appears normal. Paraspinal and other soft tissues: As described above. Disc levels: T12-L1 to L3-4 are negative. L4-5: Shallow disc bulge and moderate facet arthropathy. L5-S1: Negative. IMPRESSION: The examination is positive for a septic facet effusion on the right at L4-5 resulting in 2 epidural abscesses. The larger collection emanates from the medial margin of the right L4-5 facet and results in mass effect on the thecal sac which is deflected to the left and narrowed. A second smaller abscess is seen superiorly at the level of the L4 pedicles which causes minimal mass effect on the thecal sac. A small abscess extending posteriorly out of the L4-5 facet is also noted. Extensive inflammatory change in posterior paraspinous musculature is worse on the right in the multi tip at this and consistent with myositis. No intramuscular abscess is identified. Normal appearing thoracic spine. Left worse than right airspace disease consistent with pneumonia. These results were called by telephone at the time of interpretation on 12/19/2018 at 3:26 pm to [facility] who verbally acknowledged these results. Electronically Signed   By: Inge Rise M.D.   On: 12/19/2018 15:28   Mr Lumbar Spine W Wo Contrast  Result Date: 12/19/2018 CLINICAL DATA:  Severe thoracic and lumbar spine pain for 2 days. Possible abnormality at L4-5 on prior CT abdomen and pelvis. EXAM: MRI THORACIC AND LUMBAR SPINE WITHOUT AND WITH CONTRAST TECHNIQUE: Multiplanar and multiecho pulse sequences of the thoracic and lumbar spine were obtained without and with intravenous contrast. CONTRAST:  8 mL Gadavist IV. COMPARISON:  CT abdomen and pelvis 12/19/2018. FINDINGS: MRI THORACIC SPINE FINDINGS  Alignment:  Normal. Vertebrae: No fracture, evidence of discitis, or bone lesion. Cord:  Normal signal and morphology. Paraspinal and other soft tissues: Extensive airspace disease in the left chest is identified. There appears to be patchy airspace disease on the right. Disc levels: The central canal and foramina are widely patent at all levels. No disc bulge or protrusion. MRI LUMBAR SPINE FINDINGS Segmentation:  Standard. Alignment:  Maintained. Vertebrae: No fracture or evidence of discitis or osteomyelitis. However, there is edema and enhancement and posterior paraspinous musculature bilaterally but much worse on the right in the multifidus. Inflammatory change is centered about the right L4-5 facet joint. An abscess extending out of the medial margin of the L4-5 facets into the epidural  space measures 0.5 cm transverse by 0.9 cm AP by approximately 2.4 cm craniocaudal. The abscess results in mass effect on the thecal sac which is deflected to the left and deformed. A second smaller abscess is seen subjacent to the right L4 pedicle and lamina measuring 0.8 cm AP x 0.3 cm transverse by approximately 0.8 cm craniocaudal. There is only mild mass effect on the right side of the thecal sac secondary to this collection. A small rim enhancing fluid collection emanates posteriorly out of the right L4-5 facet measures 0.8 cm transverse by 0.9 cm AP by 1.5 cm craniocaudal. Conus medullaris: Extends to the L1 level and appears normal. Paraspinal and other soft tissues: As described above. Disc levels: T12-L1 to L3-4 are negative. L4-5: Shallow disc bulge and moderate facet arthropathy. L5-S1: Negative. IMPRESSION: The examination is positive for a septic facet effusion on the right at L4-5 resulting in 2 epidural abscesses. The larger collection emanates from the medial margin of the right L4-5 facet and results in mass effect on the thecal sac which is deflected to the left and narrowed. A second smaller abscess is seen  superiorly at the level of the L4 pedicles which causes minimal mass effect on the thecal sac. A small abscess extending posteriorly out of the L4-5 facet is also noted. Extensive inflammatory change in posterior paraspinous musculature is worse on the right in the multi tip at this and consistent with myositis. No intramuscular abscess is identified. Normal appearing thoracic spine. Left worse than right airspace disease consistent with pneumonia. These results were called by telephone at the time of interpretation on 12/19/2018 at 3:26 pm to [facility] who verbally acknowledged these results. Electronically Signed   By: Inge Rise M.D.   On: 12/19/2018 15:28   Ct Abdomen Pelvis W Contrast  Result Date: 12/19/2018 CLINICAL DATA:  43 year old male with hematemesis and back pain. EXAM: CT ABDOMEN AND PELVIS WITH CONTRAST TECHNIQUE: Multidetector CT imaging of the abdomen and pelvis was performed using the standard protocol following bolus administration of intravenous contrast. CONTRAST:  63mL ISOVUE-300 IOPAMIDOL (ISOVUE-300) INJECTION 61% COMPARISON:  Lumbar spine CT dated 12/19/2018 . FINDINGS: Lower chest: Confluent airspace density in the left lower lobe and lingula and to a lesser degree in the right middle lobe most consistent with pneumonia. Clinical correlation is recommended. No intra-abdominal free air or free fluid. Hepatobiliary: Probable mild fatty infiltration of the liver. No intrahepatic biliary ductal dilatation. The gallbladder is unremarkable. Pancreas: Unremarkable. No pancreatic ductal dilatation or surrounding inflammatory changes. Spleen: Normal in size without focal abnormality. Adrenals/Urinary Tract: The adrenal glands are unremarkable. The kidneys, visualized ureters, and urinary bladder appear unremarkable as well. Stomach/Bowel: There is moderate stool throughout the colon. There is no bowel obstruction or active inflammation. The appendix is normal. Vascular/Lymphatic:  Mild aortoiliac atherosclerotic disease. The IVC is unremarkable. No portal venous gas. There is no adenopathy. Reproductive: The prostate and seminal vesicles are grossly unremarkable. No pelvic mass. Other: None Musculoskeletal: No acute osseous pathology. Apparent mild haziness of the fat plane at L4-L5 neural foramina and posterior right psoas muscle. This can be. This may be artifactual. If there is clinical concern for an infectious or inflammatory process, MRI may provide better evaluation. No drainable fluid collection. IMPRESSION: 1. Multifocal pneumonia.  Clinical correlation is recommended. 2. No acute intra-abdominal or pelvic pathology. No bowel obstruction or active inflammation. Normal appendix. 3. Artifact versus mild stranding of the fat at L4-L5. Clinical correlation is recommended. 4. Minimal aortic  Atherosclerosis (ICD10-I70.0). Electronically Signed   By: Anner Crete M.D.   On: 12/19/2018 10:34   Ct L-spine No Charge  Result Date: 12/19/2018 CLINICAL DATA:  43 year old male with hematemesis and back pain. EXAM: CT LUMBAR SPINE WITHOUT CONTRAST TECHNIQUE: Multidetector CT imaging of the lumbar spine was performed without intravenous contrast administration. Multiplanar CT image reconstructions were also generated. COMPARISON:  CT of the abdomen pelvis dated 9320 FINDINGS: Segmentation: 5 lumbar type vertebrae. Alignment: Normal. Vertebrae: No acute fracture or focal pathologic process. Paraspinal and other soft tissues: No fluid collection. There is apparent mild haziness of the fat plane primarily at L4-L5 involving the neural foramina and posterior aspect of the right psoas muscle. If there is clinical concern for an infectious or inflammatory process, MRI may provide better evaluation. Disc levels: No degenerative changes. The neural foramina are patent. IMPRESSION: 1. No acute/traumatic lumbar spine pathology. 2. Apparent mild haziness of the fat plane primarily at L4-L5 involving the  neural foramina and posterior aspect of the right psoas muscle. This may be artifactual. If there is clinical concern for an infectious or inflammatory process, MRI may provide better evaluation. Electronically Signed   By: Anner Crete M.D.   On: 12/19/2018 10:34   Dg Chest Port 1 View  Result Date: 12/19/2018 CLINICAL DATA:  Pneumonia. EXAM: PORTABLE CHEST 1 VIEW COMPARISON:  None. FINDINGS: The heart size and mediastinal contours are within normal limits. No pneumothorax or pleural effusion is noted. Right lung is clear. Left midlung and basilar opacity is noted consistent with pneumonia. The visualized skeletal structures are unremarkable. IMPRESSION: Large left-sided pneumonia. Followup PA and lateral chest X-ray is recommended in 3-4 weeks following trial of antibiotic therapy to ensure resolution and exclude underlying malignancy. Electronically Signed   By: Marijo Conception M.D.   On: 12/19/2018 12:15     Assessment/Plan Active Problems:   Pneumonia   Sepsis: -Secondary to underlying multifocal pneumonia & L4-L5 epidural abscess -WBC: Elevated, patient is tachycardic-120s, tachypneic->20s, LA: WNL -Hx of IV heroin addiction -Admit inpatient.  Start on IV fluids, received IV Rocephin, azithromycin and vancomycin-we will continue same antibiotics. -COVID-19 came back negative.  Reviewed chest x-ray, CT abdomen/pelvis, MRI lumbar spine. -Blood culture is pending, ordered transthoracic echo -Sed rate: WNL, ordered CRP -Tylenol-for pain control -Ordered urinary Legionella antigen and strep pneumonia antigen  L4-L5 epidural abscess: -Secondary to IV heroin abuse -Reviewed MRI results. -Consulted neurosurgery for further evaluation and management. -Continue IV antibiotics  Elevated liver enzymes: - we will check hepatitis panel, HIV status -Repeat CMP tomorrow a.m.  AKI: -Likely secondary to dehydration, underlying sepsis -Continue IV fluids, avoid NSAIDs and nephrotoxic  medication -Repeat CMP tomorrow a.m.  Episodes of blood in vomitus: -6 episodes since last night as per patient -H&H is stable, likely secondary to NSAID induced gastritis as patient has been taking bunch of over-the-counter ibuprofen lately due to lower back pain. -Avoid NSAIDs, started on Protonix IV daily -Monitor H&H-if drops consider GI consultation for possible upper GI endoscopy.  IV heroin abuse: -Discussed with the patient regarding Suboxone and he agreed.  Skin benefits discussed with the patient and he verbalized understanding. -Transthoracic echo, urine drug screen, HIV ordered    DVT prophylaxis: Lovenox  code Status: Full code Family Communication: None present at bedside.  Plan of care discussed with patient in length and he verbalized understanding and agreed with it. Disposition Plan: to be determined Consults called: Neurosurgery Dr. Ellene Route Admission status: Inpatient   [place]  MD [facility] Pager [phone]  If 7PM-7AM, please contact night-coverage [email or web address] Password [facility]  12/19/2018, 5:31 PM

## 2018-12-19 NOTE — ED Notes (Signed)
Patient transported to MRI 

## 2018-12-19 NOTE — Care Management (Signed)
ED CM consulted concerning resources  for IV safe injection sites and needle exchange. CM met with patient and discussed and provided information for the sites in [facility], and information for SA treatment centers as well. patient verbalized understanding teach back done. No further ED CM needs.

## 2018-12-19 NOTE — Progress Notes (Signed)
Pharmacy Antibiotic Note  Bradley Weber is a 43 y.o. male admitted on 12/19/2018 with pneumonia per CT scan and possible spinal abscess.  Pharmacy has been consulted for Vancomycin, Ceftriaxone, and Azithromycin dosing.  -WBC elevated 29.5, SCr elevated at 1.92 with estimated CrCL ~53 mL/min. Afebrile currently  -Patient received Ceftriaxone, Azithromycin, and Vancomycin (1500mg ) in ED at [facility] today.   Plan: Vancomycin 750mg  IV every 12 hours. Consider trough prior to 3rd or 4th dose with elevated SCr.  Ceftriaxone 2g IV every 12 hours -high dose due to possible spinal abscess.  Azithromycin mg IV daily.  Follow-up plan for further gram negative and potentially anaerobic coverage.  Monitor changing renal function, culture results, and clinical status.   Height: 5\' 11"  (180.3 cm) Weight: 175 lb (79.4 kg) IBW/kg (Calculated) : 75.3  Temp (24hrs), Avg:97.8 F (36.6 C), Min:97.8 F (36.6 C), Max:97.8 F (36.6 C)  Recent Labs  Lab 12/19/18 0530 12/19/18 0855  WBC 29.5*  --   CREATININE 1.92*  --   LATICACIDVEN  --  1.9    Estimated Creatinine Clearance: 52.8 mL/min (A) (by C-G formula based on SCr of 1.92 mg/dL (H)).    No Known Allergies  Antimicrobials this admission: Vancomcyin 9/3 >> Ceftriaxone 9/3 x1 Azithromycin 9/3 x1  Dose adjustments this admission:   Microbiology results: 9/3 BCx: sent 9/3 COVID: negative  Thank you for allowing pharmacy to be a part of this patient's care.  Sloan Leiter, PharmD, BCPS, BCCCP Clinical Pharmacist Clinical phone 12/19/2018 until 9:30PM - [phone] Please refer to [facility] for [place] numbers 12/19/2018 5:46 PM

## 2018-12-19 NOTE — ED Triage Notes (Signed)
Patient with severe back pain that has been going on for about 2 days.  Patient states that he does have a history of kidney stones.  Patient states that he also has been vomiting dark red blood this morning.

## 2018-12-19 NOTE — Consult Note (Signed)
Reason for Consult: Back pain with facet infection and abscess formation Referring Physician: Dr. Ree Shay ZAHARI CALLON is an 43 y.o. male.  HPI: Patient is a 43 year old male who has had intractable back pain for the last day he notes that he is not had any fever or chills but on evaluation emergency department was noted that he had a white count of 26,000 and evidence of pneumonia on CT scans.  He complained of intractable back pain and MRI of the lumbar spine demonstrates that there is severe facet arthropathy on the right side at L4-L5 with evidence of a epidural abscess formation near the facet on the right side.  Was consulted because of this.  The patient admits to IV drug abuse a couple days ago.  Past Medical History:  Diagnosis Date  . ADD (attention deficit disorder)   . Basal cell carcinoma   . Chronic headaches   . Kidney stone   . MRSA (methicillin resistant Staphylococcus aureus)     Past Surgical History:  Procedure Laterality Date  . MOUTH SURGERY    . MRSA cyst excision     chest  . SKIN CANCER EXCISION      Family History  Problem Relation Age of Onset  . Heart attack Father   . Pancreatic cancer Maternal Grandmother   . Diabetes Maternal Grandmother     Social History:  reports that he has been smoking. He has never used smokeless tobacco. He reports current drug use. Drug: Oxycodone. He reports that he does not drink alcohol.  Allergies: No Known Allergies  Medications: I have reviewed the patient's current medications.  Results for orders placed or performed during the hospital encounter of 12/19/18 (from the past 48 hour(s))  Urinalysis, Routine w reflex microscopic     Status: Abnormal   Collection Time: 12/19/18  5:24 AM  Result Value Ref Range   Color, Urine AMBER (A) YELLOW    Comment: BIOCHEMICALS MAY BE AFFECTED BY COLOR   APPearance CLOUDY (A) CLEAR   Specific Gravity, Urine 1.024 1.005 - 1.030   pH 5.0 5.0 - 8.0   Glucose, UA 50 (A)  NEGATIVE mg/dL   Hgb urine dipstick SMALL (A) NEGATIVE   Bilirubin Urine NEGATIVE NEGATIVE   Ketones, ur NEGATIVE NEGATIVE mg/dL   Protein, ur 100 (A) NEGATIVE mg/dL   Nitrite NEGATIVE NEGATIVE   Leukocytes,Ua NEGATIVE NEGATIVE   RBC / HPF 0-5 0 - 5 RBC/hpf   WBC, UA >50 (H) 0 - 5 WBC/hpf   Bacteria, UA RARE (A) NONE SEEN   Squamous Epithelial / LPF 0-5 0 - 5   WBC Clumps PRESENT    Mucus PRESENT    Hyaline Casts, UA PRESENT    Granular Casts, UA PRESENT    Urine-Other WBC CASTS PRESENT    Non Squamous Epithelial 0-5 (A) NONE SEEN    Comment: Performed at [facility], [place] [address], [place], [place] [address]  Lipase, blood     Status: None   Collection Time: 12/19/18  5:30 AM  Result Value Ref Range   Lipase 20 11 - 51 U/L    Comment: Performed at [facility] [address], [place], [place] [address]  Comprehensive metabolic panel     Status: Abnormal   Collection Time: 12/19/18  5:30 AM  Result Value Ref Range   Sodium 137 135 - 145 mmol/L   Potassium 3.7 3.5 - 5.1 mmol/L   Chloride 98 98 - 111 mmol/L  CO2 25 22 - 32 mmol/L   Glucose, Bld 103 (H) 70 - 99 mg/dL   BUN 16 6 - 20 mg/dL   Creatinine, Ser 1.92 (H) 0.61 - 1.24 mg/dL   Calcium 9.1 8.9 - 10.3 mg/dL   Total Protein 7.4 6.5 - 8.1 g/dL   Albumin 3.6 3.5 - 5.0 g/dL   AST 70 (H) 15 - 41 U/L   ALT 97 (H) 0 - 44 U/L   Alkaline Phosphatase 112 38 - 126 U/L   Total Bilirubin 1.8 (H) 0.3 - 1.2 mg/dL   GFR calc non Af Amer 42 (L) >60 mL/min   GFR calc Af Amer 48 (L) >60 mL/min   Anion gap 14 5 - 15    Comment: Performed at [facility], [address] [place], [place] [address]  CBC     Status: Abnormal   Collection Time: 12/19/18  5:30 AM  Result Value Ref Range   WBC 29.5 (H) 4.0 - 10.5 K/uL   RBC 5.49 4.22 - 5.81 MIL/uL   Hemoglobin 16.0 13.0 - 17.0 g/dL   HCT 46.9 39.0 - 52.0 %   MCV 85.4 80.0 - 100.0 fL   MCH 29.1 26.0 - 34.0 pg   MCHC 34.1 30.0 - 36.0 g/dL   RDW 12.5 11.5 - 15.5 %    Platelets 205 150 - 400 K/uL   nRBC 0.0 0.0 - 0.2 %    Comment: Performed at [facility], [place] [address] [place], [place] [address]  Sedimentation rate     Status: None   Collection Time: 12/19/18  5:30 AM  Result Value Ref Range   Sed Rate 12 0 - 16 mm/hr    Comment: Performed at [facility] [address], [place], [place] [address]  Lactic acid, plasma     Status: None   Collection Time: 12/19/18  8:55 AM  Result Value Ref Range   Lactic Acid, Venous 1.9 0.5 - 1.9 mmol/L    Comment: Performed at [facility] [address], [place], [place] [address]  SARS Coronavirus 2 [facility] order, Performed in [facility] hospital lab) Nasopharyngeal Nasopharyngeal Swab     Status: None   Collection Time: 12/19/18 11:04 AM   Specimen: Nasopharyngeal Swab  Result Value Ref Range   SARS Coronavirus 2 NEGATIVE NEGATIVE    Comment: (NOTE) If result is NEGATIVE SARS-CoV-2 target nucleic acids are NOT DETECTED. The SARS-CoV-2 RNA is generally detectable in upper and lower  respiratory specimens during the acute phase of infection. The lowest  concentration of SARS-CoV-2 viral copies this assay can detect is 250  copies / mL. A negative result does not preclude SARS-CoV-2 infection  and should not be used as the sole basis for treatment or other  patient management decisions.  A negative result may occur with  improper specimen collection / handling, submission of specimen other  than nasopharyngeal swab, presence of viral mutation(s) within the  areas targeted by this assay, and inadequate number of viral copies  (<250 copies / mL). A negative result must be combined with clinical  observations, patient history, and epidemiological information. If result is POSITIVE SARS-CoV-2 target nucleic acids are DETECTED. The SARS-CoV-2 RNA is generally detectable in upper and lower  respiratory specimens dur ing the acute phase of infection.  Positive  results are indicative of  active infection with SARS-CoV-2.  Clinical  correlation with patient history and other diagnostic information is  necessary to  determine patient infection status.  Positive results do  not rule out bacterial infection or co-infection with other viruses. If result is PRESUMPTIVE POSTIVE SARS-CoV-2 nucleic acids MAY BE PRESENT.   A presumptive positive result was obtained on the submitted specimen  and confirmed on repeat testing.  While 2019 novel coronavirus  (SARS-CoV-2) nucleic acids may be present in the submitted sample  additional confirmatory testing may be necessary for epidemiological  and / or clinical management purposes  to differentiate between  SARS-CoV-2 and other Sarbecovirus currently known to infect humans.  If clinically indicated additional testing with an alternate test  methodology [phone]) is advised. The SARS-CoV-2 RNA is generally  detectable in upper and lower respiratory sp ecimens during the acute  phase of infection. The expected result is Negative. Fact Sheet for Patients:  StrictlyIdeas.no Fact Sheet for Healthcare Providers: BankingDealers.co.za This test is not yet approved or cleared by the [place] FDA and has been authorized for detection and/or diagnosis of SARS-CoV-2 by FDA under an Emergency Use Authorization (EUA).  This EUA will remain in effect (meaning this test can be used) for the duration of the COVID-19 declaration under Section 564(b)(1) of the Act, 21 U.S.C. section 360bbb-3(b)(1), unless the authorization is terminated or revoked sooner. Performed at [facility], [place] [address] [place], [place] [address]     Mr Thoracic Spine W Wo Contrast  Result Date: 12/19/2018 CLINICAL DATA:  Severe thoracic and lumbar spine pain for 2 days. Possible abnormality at L4-5 on prior CT abdomen and pelvis. EXAM: MRI THORACIC AND LUMBAR SPINE WITHOUT AND WITH CONTRAST TECHNIQUE: Multiplanar  and multiecho pulse sequences of the thoracic and lumbar spine were obtained without and with intravenous contrast. CONTRAST:  8 mL Gadavist IV. COMPARISON:  CT abdomen and pelvis 12/19/2018. FINDINGS: MRI THORACIC SPINE FINDINGS Alignment:  Normal. Vertebrae: No fracture, evidence of discitis, or bone lesion. Cord:  Normal signal and morphology. Paraspinal and other soft tissues: Extensive airspace disease in the left chest is identified. There appears to be patchy airspace disease on the right. Disc levels: The central canal and foramina are widely patent at all levels. No disc bulge or protrusion. MRI LUMBAR SPINE FINDINGS Segmentation:  Standard. Alignment:  Maintained. Vertebrae: No fracture or evidence of discitis or osteomyelitis. However, there is edema and enhancement and posterior paraspinous musculature bilaterally but much worse on the right in the multifidus. Inflammatory change is centered about the right L4-5 facet joint. An abscess extending out of the medial margin of the L4-5 facets into the epidural space measures 0.5 cm transverse by 0.9 cm AP by approximately 2.4 cm craniocaudal. The abscess results in mass effect on the thecal sac which is deflected to the left and deformed. A second smaller abscess is seen subjacent to the right L4 pedicle and lamina measuring 0.8 cm AP x 0.3 cm transverse by approximately 0.8 cm craniocaudal. There is only mild mass effect on the right side of the thecal sac secondary to this collection. A small rim enhancing fluid collection emanates posteriorly out of the right L4-5 facet measures 0.8 cm transverse by 0.9 cm AP by 1.5 cm craniocaudal. Conus medullaris: Extends to the L1 level and appears normal. Paraspinal and other soft tissues: As described above. Disc levels: T12-L1 to L3-4 are negative. L4-5: Shallow disc bulge and moderate facet arthropathy. L5-S1: Negative. IMPRESSION: The examination is positive for a septic facet effusion on the right at L4-5  resulting in 2 epidural abscesses. The larger collection emanates  from the medial margin of the right L4-5 facet and results in mass effect on the thecal sac which is deflected to the left and narrowed. A second smaller abscess is seen superiorly at the level of the L4 pedicles which causes minimal mass effect on the thecal sac. A small abscess extending posteriorly out of the L4-5 facet is also noted. Extensive inflammatory change in posterior paraspinous musculature is worse on the right in the multi tip at this and consistent with myositis. No intramuscular abscess is identified. Normal appearing thoracic spine. Left worse than right airspace disease consistent with pneumonia. These results were called by telephone at the time of interpretation on 12/19/2018 at 3:26 pm to [facility] who verbally acknowledged these results. Electronically Signed   By: Inge Rise M.D.   On: 12/19/2018 15:28   Mr Lumbar Spine W Wo Contrast  Result Date: 12/19/2018 CLINICAL DATA:  Severe thoracic and lumbar spine pain for 2 days. Possible abnormality at L4-5 on prior CT abdomen and pelvis. EXAM: MRI THORACIC AND LUMBAR SPINE WITHOUT AND WITH CONTRAST TECHNIQUE: Multiplanar and multiecho pulse sequences of the thoracic and lumbar spine were obtained without and with intravenous contrast. CONTRAST:  8 mL Gadavist IV. COMPARISON:  CT abdomen and pelvis 12/19/2018. FINDINGS: MRI THORACIC SPINE FINDINGS Alignment:  Normal. Vertebrae: No fracture, evidence of discitis, or bone lesion. Cord:  Normal signal and morphology. Paraspinal and other soft tissues: Extensive airspace disease in the left chest is identified. There appears to be patchy airspace disease on the right. Disc levels: The central canal and foramina are widely patent at all levels. No disc bulge or protrusion. MRI LUMBAR SPINE FINDINGS Segmentation:  Standard. Alignment:  Maintained. Vertebrae: No fracture or evidence of discitis or osteomyelitis. However,  there is edema and enhancement and posterior paraspinous musculature bilaterally but much worse on the right in the multifidus. Inflammatory change is centered about the right L4-5 facet joint. An abscess extending out of the medial margin of the L4-5 facets into the epidural space measures 0.5 cm transverse by 0.9 cm AP by approximately 2.4 cm craniocaudal. The abscess results in mass effect on the thecal sac which is deflected to the left and deformed. A second smaller abscess is seen subjacent to the right L4 pedicle and lamina measuring 0.8 cm AP x 0.3 cm transverse by approximately 0.8 cm craniocaudal. There is only mild mass effect on the right side of the thecal sac secondary to this collection. A small rim enhancing fluid collection emanates posteriorly out of the right L4-5 facet measures 0.8 cm transverse by 0.9 cm AP by 1.5 cm craniocaudal. Conus medullaris: Extends to the L1 level and appears normal. Paraspinal and other soft tissues: As described above. Disc levels: T12-L1 to L3-4 are negative. L4-5: Shallow disc bulge and moderate facet arthropathy. L5-S1: Negative. IMPRESSION: The examination is positive for a septic facet effusion on the right at L4-5 resulting in 2 epidural abscesses. The larger collection emanates from the medial margin of the right L4-5 facet and results in mass effect on the thecal sac which is deflected to the left and narrowed. A second smaller abscess is seen superiorly at the level of the L4 pedicles which causes minimal mass effect on the thecal sac. A small abscess extending posteriorly out of the L4-5 facet is also noted. Extensive inflammatory change in posterior paraspinous musculature is worse on the right in the multi tip at this and consistent with myositis. No intramuscular abscess is identified. Normal appearing  thoracic spine. Left worse than right airspace disease consistent with pneumonia. These results were called by telephone at the time of interpretation on  12/19/2018 at 3:26 pm to [facility] who verbally acknowledged these results. Electronically Signed   By: Inge Rise M.D.   On: 12/19/2018 15:28   Ct Abdomen Pelvis W Contrast  Result Date: 12/19/2018 CLINICAL DATA:  43 year old male with hematemesis and back pain. EXAM: CT ABDOMEN AND PELVIS WITH CONTRAST TECHNIQUE: Multidetector CT imaging of the abdomen and pelvis was performed using the standard protocol following bolus administration of intravenous contrast. CONTRAST:  23mL ISOVUE-300 IOPAMIDOL (ISOVUE-300) INJECTION 61% COMPARISON:  Lumbar spine CT dated 12/19/2018 . FINDINGS: Lower chest: Confluent airspace density in the left lower lobe and lingula and to a lesser degree in the right middle lobe most consistent with pneumonia. Clinical correlation is recommended. No intra-abdominal free air or free fluid. Hepatobiliary: Probable mild fatty infiltration of the liver. No intrahepatic biliary ductal dilatation. The gallbladder is unremarkable. Pancreas: Unremarkable. No pancreatic ductal dilatation or surrounding inflammatory changes. Spleen: Normal in size without focal abnormality. Adrenals/Urinary Tract: The adrenal glands are unremarkable. The kidneys, visualized ureters, and urinary bladder appear unremarkable as well. Stomach/Bowel: There is moderate stool throughout the colon. There is no bowel obstruction or active inflammation. The appendix is normal. Vascular/Lymphatic: Mild aortoiliac atherosclerotic disease. The IVC is unremarkable. No portal venous gas. There is no adenopathy. Reproductive: The prostate and seminal vesicles are grossly unremarkable. No pelvic mass. Other: None Musculoskeletal: No acute osseous pathology. Apparent mild haziness of the fat plane at L4-L5 neural foramina and posterior right psoas muscle. This can be. This may be artifactual. If there is clinical concern for an infectious or inflammatory process, MRI may provide better evaluation. No drainable fluid  collection. IMPRESSION: 1. Multifocal pneumonia.  Clinical correlation is recommended. 2. No acute intra-abdominal or pelvic pathology. No bowel obstruction or active inflammation. Normal appendix. 3. Artifact versus mild stranding of the fat at L4-L5. Clinical correlation is recommended. 4. Minimal aortic Atherosclerosis (ICD10-I70.0). Electronically Signed   By: Anner Crete M.D.   On: 12/19/2018 10:34   Ct L-spine No Charge  Result Date: 12/19/2018 CLINICAL DATA:  43 year old male with hematemesis and back pain. EXAM: CT LUMBAR SPINE WITHOUT CONTRAST TECHNIQUE: Multidetector CT imaging of the lumbar spine was performed without intravenous contrast administration. Multiplanar CT image reconstructions were also generated. COMPARISON:  CT of the abdomen pelvis dated 9320 FINDINGS: Segmentation: 5 lumbar type vertebrae. Alignment: Normal. Vertebrae: No acute fracture or focal pathologic process. Paraspinal and other soft tissues: No fluid collection. There is apparent mild haziness of the fat plane primarily at L4-L5 involving the neural foramina and posterior aspect of the right psoas muscle. If there is clinical concern for an infectious or inflammatory process, MRI may provide better evaluation. Disc levels: No degenerative changes. The neural foramina are patent. IMPRESSION: 1. No acute/traumatic lumbar spine pathology. 2. Apparent mild haziness of the fat plane primarily at L4-L5 involving the neural foramina and posterior aspect of the right psoas muscle. This may be artifactual. If there is clinical concern for an infectious or inflammatory process, MRI may provide better evaluation. Electronically Signed   By: Anner Crete M.D.   On: 12/19/2018 10:34   Dg Chest Port 1 View  Result Date: 12/19/2018 CLINICAL DATA:  Pneumonia. EXAM: PORTABLE CHEST 1 VIEW COMPARISON:  None. FINDINGS: The heart size and mediastinal contours are within normal limits. No pneumothorax or pleural effusion is noted. Right  lung is clear. Left midlung and basilar opacity is noted consistent with pneumonia. The visualized skeletal structures are unremarkable. IMPRESSION: Large left-sided pneumonia. Followup PA and lateral chest X-ray is recommended in 3-4 weeks following trial of antibiotic therapy to ensure resolution and exclude underlying malignancy. Electronically Signed   By: Marijo Conception M.D.   On: 12/19/2018 12:15    Review of Systems  Constitutional: Positive for malaise/fatigue and weight loss. Negative for chills, diaphoresis and fever.  HENT: Negative.   Respiratory: Negative.   Genitourinary: Negative.   Musculoskeletal: Positive for back pain.  Skin: Negative.   Neurological: Negative.   Endo/Heme/Allergies: Negative.   Psychiatric/Behavioral: Negative.    Blood pressure 129/71, pulse (!) 125, temperature 97.8 F (36.6 C), temperature source Oral, resp. rate (!) 24, height 5\' 11"  (1.803 m), weight 79.4 kg, SpO2 94 %. Physical Exam  Constitutional: He is oriented to person, place, and time. He appears well-developed and well-nourished.  HENT:  Head: Normocephalic and atraumatic.  Eyes: Pupils are equal, round, and reactive to light. Conjunctivae and EOM are normal.  Neck: Neck supple.  Respiratory: Effort normal and breath sounds normal.  GI: Soft. Bowel sounds are normal.  Musculoskeletal:     Comments: Positive straight leg raising at 15 degrees in either lower extremity Patrick's maneuver is negative save for back pain.  Tender to palpation in the lower lumbar spine tender to percussion.  Neurological: He is oriented to person, place, and time.  Absent deep tendon reflexes in the patellae on the right 1+ on the left Achilles reflexes are trace bilaterally.  Lower extremity strength is tested while patient is lying supine reveals 4+ out of 5 strength in iliopsoas quads tibialis anterior and gastrocs but is limited secondary to pain.  Skin: Skin is warm.  Psychiatric: He has a normal mood and  affect. His behavior is normal. Judgment and thought content normal.    Assessment/Plan: Septic facet facet arthritis with epidural abscess formation L4-L5 right.  Evidence of pneumonia.  Plan: Blood cultures for identification of pathogens and IV antibiotic treatment.  No surgery is imminent in this patient situation.  Blanchie Dessert Zamar Odwyer 12/19/2018, 8:22 PM

## 2018-12-20 ENCOUNTER — Inpatient Hospital Stay (HOSPITAL_COMMUNITY): Payer: Self-pay

## 2018-12-20 DIAGNOSIS — J181 Lobar pneumonia, unspecified organism: Secondary | ICD-10-CM

## 2018-12-20 DIAGNOSIS — F17200 Nicotine dependence, unspecified, uncomplicated: Secondary | ICD-10-CM

## 2018-12-20 DIAGNOSIS — G061 Intraspinal abscess and granuloma: Secondary | ICD-10-CM

## 2018-12-20 DIAGNOSIS — R7401 Elevation of levels of liver transaminase levels: Secondary | ICD-10-CM

## 2018-12-20 DIAGNOSIS — N179 Acute kidney failure, unspecified: Secondary | ICD-10-CM

## 2018-12-20 DIAGNOSIS — R011 Cardiac murmur, unspecified: Secondary | ICD-10-CM

## 2018-12-20 DIAGNOSIS — R7881 Bacteremia: Secondary | ICD-10-CM

## 2018-12-20 DIAGNOSIS — K5900 Constipation, unspecified: Secondary | ICD-10-CM

## 2018-12-20 DIAGNOSIS — A4101 Sepsis due to Methicillin susceptible Staphylococcus aureus: Principal | ICD-10-CM

## 2018-12-20 DIAGNOSIS — F1990 Other psychoactive substance use, unspecified, uncomplicated: Secondary | ICD-10-CM

## 2018-12-20 DIAGNOSIS — Z85828 Personal history of other malignant neoplasm of skin: Secondary | ICD-10-CM

## 2018-12-20 LAB — RAPID URINE DRUG SCREEN, HOSP PERFORMED
Amphetamines: POSITIVE — AB
Barbiturates: NOT DETECTED
Benzodiazepines: NOT DETECTED
Cocaine: NOT DETECTED
Opiates: POSITIVE — AB
Tetrahydrocannabinol: NOT DETECTED

## 2018-12-20 LAB — CBC WITH DIFFERENTIAL/PLATELET
Abs Immature Granulocytes: 0.06 10*3/uL (ref 0.00–0.07)
Basophils Absolute: 0 10*3/uL (ref 0.0–0.1)
Basophils Relative: 0 %
Eosinophils Absolute: 0 10*3/uL (ref 0.0–0.5)
Eosinophils Relative: 0 %
HCT: 40.3 % (ref 39.0–52.0)
Hemoglobin: 13.3 g/dL (ref 13.0–17.0)
Immature Granulocytes: 0 %
Lymphocytes Relative: 7 %
Lymphs Abs: 1 10*3/uL (ref 0.7–4.0)
MCH: 28.4 pg (ref 26.0–34.0)
MCHC: 33 g/dL (ref 30.0–36.0)
MCV: 86.1 fL (ref 80.0–100.0)
Monocytes Absolute: 0.9 10*3/uL (ref 0.1–1.0)
Monocytes Relative: 6 %
Neutro Abs: 12.9 10*3/uL — ABNORMAL HIGH (ref 1.7–7.7)
Neutrophils Relative %: 87 %
Platelets: 163 10*3/uL (ref 150–400)
RBC: 4.68 MIL/uL (ref 4.22–5.81)
RDW: 12.6 % (ref 11.5–15.5)
WBC: 14.9 10*3/uL — ABNORMAL HIGH (ref 4.0–10.5)
nRBC: 0 % (ref 0.0–0.2)

## 2018-12-20 LAB — BLOOD CULTURE ID PANEL (REFLEXED)

## 2018-12-20 LAB — ECHOCARDIOGRAM COMPLETE
Height: 71 in
Weight: 2684.32 oz

## 2018-12-20 LAB — COMPREHENSIVE METABOLIC PANEL
ALT: 64 U/L — ABNORMAL HIGH (ref 0–44)
AST: 46 U/L — ABNORMAL HIGH (ref 15–41)
Albumin: 2.8 g/dL — ABNORMAL LOW (ref 3.5–5.0)
Alkaline Phosphatase: 82 U/L (ref 38–126)
Anion gap: 12 (ref 5–15)
BUN: 9 mg/dL (ref 6–20)
CO2: 24 mmol/L (ref 22–32)
Calcium: 8.5 mg/dL — ABNORMAL LOW (ref 8.9–10.3)
Chloride: 98 mmol/L (ref 98–111)
Creatinine, Ser: 1 mg/dL (ref 0.61–1.24)
GFR calc Af Amer: >60 mL/min (ref >60)
GFR calc non Af Amer: >60 mL/min (ref >60)
Glucose, Bld: 128 mg/dL — ABNORMAL HIGH (ref 70–99)
Potassium: 3.9 mmol/L (ref 3.5–5.1)
Sodium: 134 mmol/L — ABNORMAL LOW (ref 135–145)
Total Bilirubin: 1.2 mg/dL (ref 0.3–1.2)
Total Protein: 6.4 g/dL — ABNORMAL LOW (ref 6.5–8.1)

## 2018-12-20 LAB — STREP PNEUMONIAE URINARY ANTIGEN: Strep Pneumo Urinary Antigen: NEGATIVE

## 2018-12-20 LAB — HIV ANTIBODY (ROUTINE TESTING W REFLEX): HIV Screen 4th Generation wRfx: NONREACTIVE

## 2018-12-20 MED ORDER — CEFAZOLIN SODIUM-DEXTROSE 1-4 GM/50ML-% IV SOLN
1.0000 g | Freq: Three times a day (TID) | INTRAVENOUS | Status: DC
  Administered 2018-12-20: 06:00:00 1 g via INTRAVENOUS
  Filled 2018-12-20 (×2): qty 50

## 2018-12-20 MED ORDER — CEFAZOLIN SODIUM-DEXTROSE 2-4 GM/100ML-% IV SOLN
2.0000 g | Freq: Three times a day (TID) | INTRAVENOUS | Status: DC
  Administered 2018-12-20 – 2018-12-23 (×9): 2 g via INTRAVENOUS
  Filled 2018-12-20 (×11): qty 100

## 2018-12-20 MED ORDER — BUPRENORPHINE HCL-NALOXONE HCL 2-0.5 MG SL SUBL: 2.0000 | SUBLINGUAL_TABLET | Freq: Every day | SUBLINGUAL | Status: DC

## 2018-12-20 NOTE — Progress Notes (Signed)
Responded to consult to see Bradley Weber.  I introduced myself to him and his mother.   Will follow-up as needed.

## 2018-12-20 NOTE — Progress Notes (Signed)
  Echocardiogram 2D Echocardiogram has been performed.  Darlina Sicilian M 12/20/2018, 12:58 PM

## 2018-12-20 NOTE — Progress Notes (Addendum)
PROGRESS NOTE    Bradley Weber  KPV:[id number] DOB: February 12, 1976 DOA: 12/19/2018 PCP: Patient, No Pcp Per   Brief Narrative:  Per HPI: 43 y.o. male history of basal cell carcinoma skin forehead and back, IV drug abuse-doing heroin on and off heroin for 3-4 years- have been in rehab before presented with worsening severe lower back pain since 1-1/2-week, was up to 10/10  NR, without numbness tingling sensation, bowel bladder incontinence.  Last heroin use 2 days PTA.Also complaining of cough fever chills, decreased appetite, weight loss shortness of breath for few days, also complaining of 6 episodes of bloody vomiting headache blurred vision weakness and lethargy  ED Course: Vitals stable COVID negative MRI shows epidural abscess, chest x-ray pneumonia, neurosurgery was consulted, WAS Given IV Rocephin vancomycin azithromycin and was admitted  Subjective: Tmax 100.5, VSS, on RA. Back pain is improved No numbness in legs, no weakness, no bowel/blader incontinence. Reports doing heroin on and off heroin for 3-4 years- have been in rehab before- now plans to quits, reports went to prison for 6 months and states it's been a struggle.  Assessment & Plan:  MSSA Staph aureus Sepsis POA with L4-L5 epidural abscess and community-acquired pneumonia in the setting of IV drug abuse: Currently hemodynamically stable, leukocytosis up at 13->14/9k, crp 20.8, lactate nl 1.9, esr nl at 12.  Blood culture with initial work-up showing MSSA.  Antibiotic regimen adjusted to ancef/azithroomycin-per ID pharmacy to dose. Follow up HIV/hepatitis panel.  Follow-up 2D echocardiogram.  He will also need repeat blood culture.   Spinal epidural abscess L4-L5: Without neurological deficit. Seen by neurosurgery, no surgical intervention indicated and to continue on IV antibiotics as above.  See MRI results below.    Community acquired pneumonia: Not hypoxic, still with level of leukocytosis continue antibiotics as #1.   Encourage ambulation, strep pneumo antigen negative, Legionella antigen pending  AKI: In the setting of sepsis, resolved with IV fluids.  Transaminitis: Mildly elevated. Likely setting of sepsis, follow-up HIV, hepatitis panel, repeat LFTs  Episodes of vomiting with blood x6 per patient, currently H&H stable, and avoid NSAIDs likely from gastritis from ibuprofen that he was taking for his lower back pain.  Continue Protonix if recurrent issues or hemoglobin downtrending will need GI for endoscopy. Recent Labs  Lab 12/19/18 0530 12/19/18 2143 12/20/18 0207  HGB 16.0 13.7 13.3  HCT 46.9 40.4 40.3   IV drug abuse: Screen positive for amphetamine/opiates reports he has been in rehab before, at this time is willing to quit. Counseling provided.  Suboxone therapy was planned on admission.  I discussed with Dr Evette Doffing from IM regarding Suboxone therapy- advised to initiate with 4 mg today and continue the same dose and go can go up to 8 mg - I discussed with patient- he is not interested in Suboxone therapy, he feels it is not necessary.   Body mass index is 23.4 kg/m.   DVT prophylaxis:Lovenox Code Status:Full  Family Communication: plan of care discussed with patient in detail. He has ex wife, 2 teen age kids and he requests we not update her- he will "tell her everything" Disposition Plan: Remains inpatient pending clinical improvement.   Consultants:  Neurosurgery  Procedures: None  Microbiology: MSSA + in blood culture  Antimicrobials: Anti-infectives (From admission, onward)   Start     Dose/Rate Route Frequency Ordered Stop   12/20/18 1400  ceFAZolin (ANCEF) IVPB 2g/100 mL premix     2 g 200 mL/hr over 30 Minutes Intravenous Every 8  hours 12/20/18 0752     12/20/18 1200  azithromycin (ZITHROMAX) 500 mg in sodium chloride 0.9 % 250 mL IVPB     500 mg 250 mL/hr over 60 Minutes Intravenous Every 24 hours 12/19/18 1840     12/20/18 0600  ceFAZolin (ANCEF) IVPB 1 g/50 mL premix   Status:  Discontinued     1 g 100 mL/hr over 30 Minutes Intravenous Every 8 hours 12/20/18 0318 12/20/18 0752   12/20/18 0000  vancomycin (VANCOCIN) IVPB 750 mg/150 ml premix  Status:  Discontinued     750 mg 150 mL/hr over 60 Minutes Intravenous Every 12 hours 12/19/18 1840 12/20/18 0317   12/20/18 0000  cefTRIAXone (ROCEPHIN) 2 g in sodium chloride 0.9 % 100 mL IVPB  Status:  Discontinued     2 g 200 mL/hr over 30 Minutes Intravenous Every 12 hours 12/19/18 1840 12/20/18 0734   12/19/18 1130  vancomycin (VANCOCIN) 1,500 mg in sodium chloride 0.9 % 500 mL IVPB     1,500 mg 250 mL/hr over 120 Minutes Intravenous  Once 12/19/18 1129 12/19/18 1723   12/19/18 1130  cefTRIAXone (ROCEPHIN) 2 g in sodium chloride 0.9 % 100 mL IVPB     2 g 200 mL/hr over 30 Minutes Intravenous  Once 12/19/18 1129 12/19/18 1226   12/19/18 1130  azithromycin (ZITHROMAX) 500 mg in sodium chloride 0.9 % 250 mL IVPB     500 mg 250 mL/hr over 60 Minutes Intravenous  Once 12/19/18 1129 12/19/18 1610       Objective: Vitals:   12/20/18 0400 12/20/18 0500 12/20/18 0706 12/20/18 1026  BP:   131/84 132/89  Pulse: 86 92 100 84  Resp: (!) 28 (!) 21 (!) 21 20  Temp:  99.8 F (37.7 C) 98.2 F (36.8 C) 98.2 F (36.8 C)  TempSrc:  Oral Oral Oral  SpO2:   97% 96%  Weight:      Height:        Intake/Output Summary (Last 24 hours) at 12/20/2018 1038 Last data filed at 12/20/2018 1029 Gross per 24 hour  Intake 1135 ml  Output 1250 ml  Net -115 ml   Filed Weights   12/19/18 0518 12/19/18 2036  Weight: 79.4 kg 76.1 kg   Weight change: -3.28 kg  Body mass index is 23.4 kg/m.  Intake/Output from previous day: 09/03 0701 - 09/04 0700 In: 973.3 [P.O.:120; I.V.:625; IV Piggyback:228.3] Out: 550 [Urine:550] Intake/Output this shift: Total I/O In: 240 [P.O.:240] Out: 700 [Urine:700]  Examination:  General exam: Appears calm and comfortable,Not in distress, older fore the age HEENT:PERRL,Oral mucosa moist,  Ear/Nose normal on gross exam Respiratory system: Bilateral equal air entry, normal vesicular breath sounds, no wheezes or crackles  Cardiovascular system: S1 & S2 heard,No JVD, murmurs. Gastrointestinal system: Abdomen is  soft, non tender, non distended, BS +  Nervous System:Alert and oriented. No focal neurological deficits/moving extremities, sensation intact. Extremities: No edema, no clubbing, distal peripheral pulses palpable. Skin: No rashes, lesions, no icterus MSK: Normal muscle bulk,tone ,power  Medications:  Scheduled Meds:  enoxaparin (LOVENOX) injection  40 mg Subcutaneous Q24H   pantoprazole (PROTONIX) IV  40 mg Intravenous Q24H   sodium chloride flush  3 mL Intravenous Q12H   Continuous Infusions:  sodium chloride 75 mL/hr at 12/19/18 1940   sodium chloride     azithromycin      ceFAZolin (ANCEF) IV      Data Reviewed: I have personally reviewed following labs and imaging studies  CBC: Recent  Labs  Lab 12/19/18 0530 12/19/18 2143 12/20/18 0207  WBC 29.5* 13.9* 14.9*  NEUTROABS  --   --  12.9*  HGB 16.0 13.7 13.3  HCT 46.9 40.4 40.3  MCV 85.4 84.9 86.1  PLT 205 169 416   Basic Metabolic Panel: Recent Labs  Lab 12/19/18 0530 12/19/18 2143 12/20/18 0207  NA 137  --  134*  K 3.7  --  3.9  CL 98  --  98  CO2 25  --  24  GLUCOSE 103*  --  128*  BUN 16  --  9  CREATININE 1.92* 1.16 1.00  CALCIUM 9.1  --  8.5*  MG  --  1.7  --    GFR: Estimated Creatinine Clearance: 101.4 mL/min (by C-G formula based on SCr of 1 mg/dL). Liver Function Tests: Recent Labs  Lab 12/19/18 0530 12/20/18 0207  AST 70* 46*  ALT 97* 64*  ALKPHOS 112 82  BILITOT 1.8* 1.2  PROT 7.4 6.4*  ALBUMIN 3.6 2.8*   Recent Labs  Lab 12/19/18 0530  LIPASE 20   No results for input(s): AMMONIA in the last 168 hours. Coagulation Profile: Recent Labs  Lab 12/19/18 2143  INR 1.1   Cardiac Enzymes: No results for input(s): CKTOTAL, CKMB, CKMBINDEX, TROPONINI in the  last 168 hours. BNP (last 3 results) No results for input(s): PROBNP in the last 8760 hours. HbA1C: No results for input(s): HGBA1C in the last 72 hours. CBG: No results for input(s): GLUCAP in the last 168 hours. Lipid Profile: No results for input(s): CHOL, HDL, LDLCALC, TRIG, CHOLHDL, LDLDIRECT in the last 72 hours. Thyroid Function Tests: No results for input(s): TSH, T4TOTAL, FREET4, T3FREE, THYROIDAB in the last 72 hours. Anemia Panel: No results for input(s): VITAMINB12, FOLATE, FERRITIN, TIBC, IRON, RETICCTPCT in the last 72 hours. Sepsis Labs: Recent Labs  Lab 12/19/18 0855  LATICACIDVEN 1.9    Recent Results (from the past 240 hour(s))  Blood culture (routine x 2)     Status: Abnormal (Preliminary result)   Collection Time: 12/19/18  9:00 AM   Specimen: BLOOD RIGHT ARM  Result Value Ref Range Status   Specimen Description BLOOD RIGHT ARM  Final   Special Requests   Final    BOTTLES DRAWN AEROBIC AND ANAEROBIC Blood Culture adequate volume   Culture  Setup Time   Final    IN BOTH AEROBIC AND ANAEROBIC BOTTLES GRAM POSITIVE COCCI CRITICAL VALUE NOTED.  VALUE IS CONSISTENT WITH PREVIOUSLY REPORTED AND CALLED VALUE. Performed at [facility], [place] [address], [place], [place] [address]    Culture STAPHYLOCOCCUS AUREUS (A)  Final   Report Status PENDING  Incomplete  Blood culture (routine x 2)     Status: Abnormal (Preliminary result)   Collection Time: 12/19/18  9:01 AM   Specimen: BLOOD  Result Value Ref Range Status   Specimen Description BLOOD SITE NOT SPECIFIED  Final   Special Requests   Final    BOTTLES DRAWN AEROBIC AND ANAEROBIC Blood Culture results may not be optimal due to an inadequate volume of blood received in culture bottles   Culture  Setup Time   Final    IN BOTH AEROBIC AND ANAEROBIC BOTTLES GRAM POSITIVE COCCI Organism ID to follow CRITICAL RESULT CALLED TO, READ BACK BY AND VERIFIED WITH: PHRMD L SEAY  _0  12/20/18 BY S GEZAHEGN     Culture (A)  Final    STAPHYLOCOCCUS AUREUS CULTURE REINCUBATED FOR BETTER GROWTH Performed at [facility], [address], [place], [place] [address]    Report Status PENDING  Incomplete  Blood Culture ID Panel (Reflexed)     Status: Abnormal   Collection Time: 12/19/18  9:01 AM  Result Value Ref Range Status   Enterococcus species NOT DETECTED NOT DETECTED Final   Listeria monocytogenes NOT DETECTED NOT DETECTED Final   Staphylococcus species DETECTED (A) NOT DETECTED Final    Comment: CRITICAL RESULT CALLED TO, READ BACK BY AND VERIFIED WITH: PHRMD L SEAY  _0  12/20/18 BY S GEZAHEGN    Staphylococcus aureus (BCID) DETECTED (A) NOT DETECTED Final    Comment: Methicillin (oxacillin) susceptible Staphylococcus aureus (MSSA). Preferred therapy is anti staphylococcal beta lactam antibiotic (Cefazolin or Nafcillin), unless clinically contraindicated. CRITICAL RESULT CALLED TO, READ BACK BY AND VERIFIED WITH: PHRMD L SEAY  _1  12/20/18 BY S GEZAHEGN    Methicillin resistance NOT DETECTED NOT DETECTED Final   Streptococcus species NOT DETECTED NOT DETECTED Final   Streptococcus agalactiae NOT DETECTED NOT DETECTED Final   Streptococcus pneumoniae NOT DETECTED NOT DETECTED Final   Streptococcus pyogenes NOT DETECTED NOT DETECTED Final   Acinetobacter baumannii NOT DETECTED NOT DETECTED Final   Enterobacteriaceae species NOT DETECTED NOT DETECTED Final   Enterobacter cloacae complex NOT DETECTED NOT DETECTED Final   Escherichia coli NOT DETECTED NOT DETECTED Final   Klebsiella oxytoca NOT DETECTED NOT DETECTED Final   Klebsiella pneumoniae NOT DETECTED NOT DETECTED Final   Proteus species NOT DETECTED NOT DETECTED Final   Serratia marcescens NOT DETECTED NOT DETECTED Final   Haemophilus influenzae NOT DETECTED NOT DETECTED Final   Neisseria meningitidis NOT DETECTED NOT DETECTED Final   Pseudomonas aeruginosa NOT DETECTED NOT DETECTED Final   Candida albicans NOT DETECTED NOT  DETECTED Final   Candida glabrata NOT DETECTED NOT DETECTED Final   Candida krusei NOT DETECTED NOT DETECTED Final   Candida parapsilosis NOT DETECTED NOT DETECTED Final   Candida tropicalis NOT DETECTED NOT DETECTED Final    Comment: Performed at [facility], [place] [address], [place], [place] [address]  SARS Coronavirus 2 [facility] order, Performed in [facility] hospital lab) Nasopharyngeal Nasopharyngeal Swab     Status: None   Collection Time: 12/19/18 11:04 AM   Specimen: Nasopharyngeal Swab  Result Value Ref Range Status   SARS Coronavirus 2 NEGATIVE NEGATIVE Final    Comment: (NOTE) If result is NEGATIVE SARS-CoV-2 target nucleic acids are NOT DETECTED. The SARS-CoV-2 RNA is generally detectable in upper and lower  respiratory specimens during the acute phase of infection. The lowest  concentration of SARS-CoV-2 viral copies this assay can detect is 250  copies / mL. A negative result does not preclude SARS-CoV-2 infection  and should not be used as the sole basis for treatment or other  patient management decisions.  A negative result may occur with  improper specimen collection / handling, submission of specimen other  than nasopharyngeal swab, presence of viral mutation(s) within the  areas targeted by this assay, and inadequate number of viral copies  (<250 copies / mL). A negative result must be combined with clinical  observations, patient history, and epidemiological information. If result is POSITIVE SARS-CoV-2 target nucleic acids are DETECTED. The SARS-CoV-2 RNA is generally detectable in upper and lower  respiratory specimens dur ing the acute phase of infection.  Positive  results are indicative of active infection with SARS-CoV-2.  Clinical  correlation with patient history and other diagnostic information is  necessary to determine patient infection status.  Positive results do  not rule out bacterial infection or co-infection with other viruses. If result  is PRESUMPTIVE POSTIVE SARS-CoV-2 nucleic acids MAY BE PRESENT.   A presumptive positive result was obtained on the submitted specimen  and confirmed on repeat testing.  While 2019 novel coronavirus  (SARS-CoV-2) nucleic acids may be present in the submitted sample  additional confirmatory testing may be necessary for epidemiological  and / or clinical management purposes  to differentiate between  SARS-CoV-2 and other Sarbecovirus currently known to infect humans.  If clinically indicated additional testing with an alternate test  methodology [phone]) is advised. The SARS-CoV-2 RNA is generally  detectable in upper and lower respiratory sp ecimens during the acute  phase of infection. The expected result is Negative. Fact Sheet for Patients:  StrictlyIdeas.no Fact Sheet for Healthcare Providers: BankingDealers.co.za This test is not yet approved or cleared by the [place] FDA and has been authorized for detection and/or diagnosis of SARS-CoV-2 by FDA under an Emergency Use Authorization (EUA).  This EUA will remain in effect (meaning this test can be used) for the duration of the COVID-19 declaration under Section 564(b)(1) of the Act, 21 U.S.C. section 360bbb-3(b)(1), unless the authorization is terminated or revoked sooner. Performed at [facility], [place] [address], [place], [place] [address]   MRSA PCR Screening     Status: None   Collection Time: 12/19/18  8:57 PM   Specimen: Nasal Mucosa; Nasopharyngeal  Result Value Ref Range Status   MRSA by PCR NEGATIVE NEGATIVE Final    Comment:        The GeneXpert MRSA Assay (FDA approved for NASAL specimens only), is one component of a comprehensive MRSA colonization surveillance program. It is not intended to diagnose MRSA infection nor to guide or monitor treatment for MRSA infections. Performed at [facility], [place] [address] [place], [place] [address]        Radiology Studies: Mr Thoracic Spine W Wo Contrast  Result Date: 12/19/2018 CLINICAL DATA:  Severe thoracic and lumbar spine pain for 2 days. Possible abnormality at L4-5 on prior CT abdomen and pelvis. EXAM: MRI THORACIC AND LUMBAR SPINE WITHOUT AND WITH CONTRAST TECHNIQUE: Multiplanar and multiecho pulse sequences of the thoracic and lumbar spine were obtained without and with intravenous contrast. CONTRAST:  8 mL Gadavist IV. COMPARISON:  CT abdomen and pelvis 12/19/2018. FINDINGS: MRI THORACIC SPINE FINDINGS Alignment:  Normal. Vertebrae: No fracture, evidence of discitis, or bone lesion. Cord:  Normal signal and morphology. Paraspinal and other soft tissues: Extensive airspace disease in the left chest is identified. There appears to be patchy airspace disease on the right. Disc levels: The central canal and foramina are widely patent at all levels. No disc bulge or protrusion. MRI LUMBAR SPINE FINDINGS Segmentation:  Standard. Alignment:  Maintained. Vertebrae: No fracture or evidence of discitis or osteomyelitis. However, there is edema and enhancement and posterior paraspinous musculature bilaterally but much worse on the right in the multifidus. Inflammatory change is centered about the right L4-5 facet joint. An abscess extending out of the medial margin of the L4-5 facets into the epidural space measures 0.5 cm transverse by 0.9 cm AP by approximately 2.4 cm craniocaudal. The abscess results in mass effect on the thecal sac which is deflected to the left and deformed. A second smaller abscess is seen subjacent to the right L4 pedicle and lamina measuring 0.8 cm AP x 0.3 cm transverse by approximately 0.8 cm craniocaudal. There is only mild  mass effect on the right side of the thecal sac secondary to this collection. A small rim enhancing fluid collection emanates posteriorly out of the right L4-5 facet measures 0.8 cm transverse by 0.9 cm AP by 1.5 cm craniocaudal. Conus medullaris: Extends to the  L1 level and appears normal. Paraspinal and other soft tissues: As described above. Disc levels: T12-L1 to L3-4 are negative. L4-5: Shallow disc bulge and moderate facet arthropathy. L5-S1: Negative. IMPRESSION: The examination is positive for a septic facet effusion on the right at L4-5 resulting in 2 epidural abscesses. The larger collection emanates from the medial margin of the right L4-5 facet and results in mass effect on the thecal sac which is deflected to the left and narrowed. A second smaller abscess is seen superiorly at the level of the L4 pedicles which causes minimal mass effect on the thecal sac. A small abscess extending posteriorly out of the L4-5 facet is also noted. Extensive inflammatory change in posterior paraspinous musculature is worse on the right in the multi tip at this and consistent with myositis. No intramuscular abscess is identified. Normal appearing thoracic spine. Left worse than right airspace disease consistent with pneumonia. These results were called by telephone at the time of interpretation on 12/19/2018 at 3:26 pm to [facility] who verbally acknowledged these results. Electronically Signed   By: Inge Rise M.D.   On: 12/19/2018 15:28   Mr Lumbar Spine W Wo Contrast  Result Date: 12/19/2018 CLINICAL DATA:  Severe thoracic and lumbar spine pain for 2 days. Possible abnormality at L4-5 on prior CT abdomen and pelvis. EXAM: MRI THORACIC AND LUMBAR SPINE WITHOUT AND WITH CONTRAST TECHNIQUE: Multiplanar and multiecho pulse sequences of the thoracic and lumbar spine were obtained without and with intravenous contrast. CONTRAST:  8 mL Gadavist IV. COMPARISON:  CT abdomen and pelvis 12/19/2018. FINDINGS: MRI THORACIC SPINE FINDINGS Alignment:  Normal. Vertebrae: No fracture, evidence of discitis, or bone lesion. Cord:  Normal signal and morphology. Paraspinal and other soft tissues: Extensive airspace disease in the left chest is identified. There appears to be patchy  airspace disease on the right. Disc levels: The central canal and foramina are widely patent at all levels. No disc bulge or protrusion. MRI LUMBAR SPINE FINDINGS Segmentation:  Standard. Alignment:  Maintained. Vertebrae: No fracture or evidence of discitis or osteomyelitis. However, there is edema and enhancement and posterior paraspinous musculature bilaterally but much worse on the right in the multifidus. Inflammatory change is centered about the right L4-5 facet joint. An abscess extending out of the medial margin of the L4-5 facets into the epidural space measures 0.5 cm transverse by 0.9 cm AP by approximately 2.4 cm craniocaudal. The abscess results in mass effect on the thecal sac which is deflected to the left and deformed. A second smaller abscess is seen subjacent to the right L4 pedicle and lamina measuring 0.8 cm AP x 0.3 cm transverse by approximately 0.8 cm craniocaudal. There is only mild mass effect on the right side of the thecal sac secondary to this collection. A small rim enhancing fluid collection emanates posteriorly out of the right L4-5 facet measures 0.8 cm transverse by 0.9 cm AP by 1.5 cm craniocaudal. Conus medullaris: Extends to the L1 level and appears normal. Paraspinal and other soft tissues: As described above. Disc levels: T12-L1 to L3-4 are negative. L4-5: Shallow disc bulge and moderate facet arthropathy. L5-S1: Negative. IMPRESSION: The examination is positive for a septic facet effusion on the right at L4-5  resulting in 2 epidural abscesses. The larger collection emanates from the medial margin of the right L4-5 facet and results in mass effect on the thecal sac which is deflected to the left and narrowed. A second smaller abscess is seen superiorly at the level of the L4 pedicles which causes minimal mass effect on the thecal sac. A small abscess extending posteriorly out of the L4-5 facet is also noted. Extensive inflammatory change in posterior paraspinous musculature is  worse on the right in the multi tip at this and consistent with myositis. No intramuscular abscess is identified. Normal appearing thoracic spine. Left worse than right airspace disease consistent with pneumonia. These results were called by telephone at the time of interpretation on 12/19/2018 at 3:26 pm to [facility] who verbally acknowledged these results. Electronically Signed   By: Inge Rise M.D.   On: 12/19/2018 15:28   Ct Abdomen Pelvis W Contrast  Result Date: 12/19/2018 CLINICAL DATA:  42 year old male with hematemesis and back pain. EXAM: CT ABDOMEN AND PELVIS WITH CONTRAST TECHNIQUE: Multidetector CT imaging of the abdomen and pelvis was performed using the standard protocol following bolus administration of intravenous contrast. CONTRAST:  25m ISOVUE-300 IOPAMIDOL (ISOVUE-300) INJECTION 61% COMPARISON:  Lumbar spine CT dated 12/19/2018 . FINDINGS: Lower chest: Confluent airspace density in the left lower lobe and lingula and to a lesser degree in the right middle lobe most consistent with pneumonia. Clinical correlation is recommended. No intra-abdominal free air or free fluid. Hepatobiliary: Probable mild fatty infiltration of the liver. No intrahepatic biliary ductal dilatation. The gallbladder is unremarkable. Pancreas: Unremarkable. No pancreatic ductal dilatation or surrounding inflammatory changes. Spleen: Normal in size without focal abnormality. Adrenals/Urinary Tract: The adrenal glands are unremarkable. The kidneys, visualized ureters, and urinary bladder appear unremarkable as well. Stomach/Bowel: There is moderate stool throughout the colon. There is no bowel obstruction or active inflammation. The appendix is normal. Vascular/Lymphatic: Mild aortoiliac atherosclerotic disease. The IVC is unremarkable. No portal venous gas. There is no adenopathy. Reproductive: The prostate and seminal vesicles are grossly unremarkable. No pelvic mass. Other: None Musculoskeletal: No acute  osseous pathology. Apparent mild haziness of the fat plane at L4-L5 neural foramina and posterior right psoas muscle. This can be. This may be artifactual. If there is clinical concern for an infectious or inflammatory process, MRI may provide better evaluation. No drainable fluid collection. IMPRESSION: 1. Multifocal pneumonia.  Clinical correlation is recommended. 2. No acute intra-abdominal or pelvic pathology. No bowel obstruction or active inflammation. Normal appendix. 3. Artifact versus mild stranding of the fat at L4-L5. Clinical correlation is recommended. 4. Minimal aortic Atherosclerosis (ICD10-I70.0). Electronically Signed   By: AAnner CreteM.D.   On: 12/19/2018 10:34   Ct L-spine No Charge  Result Date: 12/19/2018 CLINICAL DATA:  43year old male with hematemesis and back pain. EXAM: CT LUMBAR SPINE WITHOUT CONTRAST TECHNIQUE: Multidetector CT imaging of the lumbar spine was performed without intravenous contrast administration. Multiplanar CT image reconstructions were also generated. COMPARISON:  CT of the abdomen pelvis dated 9320 FINDINGS: Segmentation: 5 lumbar type vertebrae. Alignment: Normal. Vertebrae: No acute fracture or focal pathologic process. Paraspinal and other soft tissues: No fluid collection. There is apparent mild haziness of the fat plane primarily at L4-L5 involving the neural foramina and posterior aspect of the right psoas muscle. If there is clinical concern for an infectious or inflammatory process, MRI may provide better evaluation. Disc levels: No degenerative changes. The neural foramina are patent. IMPRESSION: 1. No acute/traumatic lumbar spine pathology.  2. Apparent mild haziness of the fat plane primarily at L4-L5 involving the neural foramina and posterior aspect of the right psoas muscle. This may be artifactual. If there is clinical concern for an infectious or inflammatory process, MRI may provide better evaluation. Electronically Signed   By: Anner Crete M.D.   On: 12/19/2018 10:34   Dg Chest Port 1 View  Result Date: 12/19/2018 CLINICAL DATA:  Pneumonia. EXAM: PORTABLE CHEST 1 VIEW COMPARISON:  None. FINDINGS: The heart size and mediastinal contours are within normal limits. No pneumothorax or pleural effusion is noted. Right lung is clear. Left midlung and basilar opacity is noted consistent with pneumonia. The visualized skeletal structures are unremarkable. IMPRESSION: Large left-sided pneumonia. Followup PA and lateral chest X-ray is recommended in 3-4 weeks following trial of antibiotic therapy to ensure resolution and exclude underlying malignancy. Electronically Signed   By: Marijo Conception M.D.   On: 12/19/2018 12:15      LOS: 1 day   Time spent: More than 50% of that time was spent in counseling and/or coordination of care.  Antonieta Pert, MD [facility]  12/20/2018, 10:38 AM

## 2018-12-20 NOTE — Progress Notes (Signed)
Patient ID: Bradley Weber, male   DOB: 09-05-1975, 43 y.o.   MRN: [id number] Patient is awake and alert today complaining of less back pain than he had yesterday Motor function appears to be intact in lower extremities with good dorsi and plantar flexors.  Straight leg raising is still positive at 30 degrees however I believe that this will continue to improve.  I discussed conservative management meant with the patient.  At this point I will sign off please reconsult [place] if further intervention is required.

## 2018-12-20 NOTE — Consult Note (Signed)
[facility] for Infectious Disease       Reason for Consult: MSSA sepsis    Referring Physician: Antonieta Pert, MD  Principal Problem:   Spinal epidural abscess Active Problems:   Community acquired pneumonia   AKI (acute kidney injury) ([place])   Transaminitis    enoxaparin (LOVENOX) injection  40 mg Subcutaneous Q24H   pantoprazole (PROTONIX) IV  40 mg Intravenous Q24H   sodium chloride flush  3 mL Intravenous Q12H    Recommendations: 1. MSSA sepsis -both of the patient's initial blood cultures are currently growing MSSA.  I suspect that this is also the causative pathogen for his lumbar epidural abscesses and likely pneumonia with the latter concerning for possible septic pulmonary emboli.  Will significantly consolidate the patient's antibiotics to cefazolin monotherapy 2 g IV every 8 hours and repeat blood cultures x2 in an effort to establish clearance of his bacteremia.  I would also check a transthoracic echocardiogram to evaluate for endocarditis.  In the event that the patient fails to promptly clear his bacteremia despite this appropriate antibiotic treatment, reconsideration for evacuation of his epidural abscesses either surgically or via IR should be entertained.  Duration of the patient's antibiotic therapy is yet undetermined as it depends on the clearance of the rapidity of clearance of his bacteremia and echocardiographic findings.  2. Lumbar epidural abscesses -MRI imaging shows a complex lumbar fluid collections suggestive of 2 distinct epidural abscesses.  Patient has control of his bowel and bladder at this time but does have significant lower extremity weakness.  Neurosurgery has deferred operative intervention at this time but would follow the patient clinically with serial neurologic exams as he is high risk for neurologic decline, particularly as 1 of his abscesses does show mass-effect on the thecal sac.  Lack of operative drainage is likely to result in  long-term progression to also to osteomyelitis and recurrence of infection if this is not adequately drained, so may need to consider IR drainage if neurosurgery continues to defer intervention.   3. IVDU -the patient states that he has completed rehab in the past for both of his narcotic/heroin and methamphetamine addictions.  An HIV antibody was negative here.  Viral hepatitis panel is currently pending.  The acuity of the patient's symptoms and complexity of his lumbar epidural abscess combined with the pathogen isolated indicates a very acute/recent exposure is likely been within the last several days or at the greatest length 1 week prior to his admission.  This would make his report to me of his last drug use of 43 months ago quite implausible.  Assessment: The patient is a 43 year old white male with known IV drug use history and remote h/o basal cell carcinoma admitted with progressive back pain subsequently found to have MSSA sepsis and multilevel lumbar epidural abscess and left sided pneumonia.  Antibiotics: Cefazolin, day 2 Rocephin, day 2 Azithromycin, day 2 Vancomycin, day 2  HPI: ERMEL BOYCHUK is a 43 y.o. white male with h/o IVDU (heroin and methamphetamines), ADD, and remote h/o basal cell carcinoma who was admitted on 12/18/2018 for low back pain.  Patient provides a conflicting story regarding recency of use of IV heroin, telling the admitting faculty that he injected 43 days ago yet with his mother present at bedside today he tells me it is been longer than 7 months.  He does describe a progressive worsening of his back pain over the last 43 days.  The day of admission he actually fell but  he does not recall what part of his body struck the 43 ground.  He denies lany oss of consciousness with this fall.  He also he also denies any loss of control of his bowel or bladder.  Upon arrival to the emergency department patient had a temperature of 100.4 and an elevated white blood cell count of  29,500.  Blood cultures were obtained and are now positive for MSSA.  Spinal imaging with both CT scan and MRI shows complex lumbar epidural abscess.  Chest imaging was suggestive of left-sided pneumonia as well.  Patient was empirically started on vancomycin, Rocephin, cefazolin, and azithromycin prior to my assessment.  His CRP was greater than 20 but his lactic acid was 1.9 upon arrival in the ER. Fever curve, WBC and Cr trends, cx results, imaging, and ABX usage all independently reviewed.  Review of Systems:  Review of Systems  Constitutional: Positive for fever. Negative for chills and weight loss.  HENT: Negative for congestion, hearing loss, sinus pain and sore throat.   Eyes: Negative for blurred vision, photophobia and discharge.  Respiratory: Negative for cough, hemoptysis and shortness of breath.   Cardiovascular: Negative for chest pain, palpitations, orthopnea and leg swelling.  Gastrointestinal: Positive for constipation. Negative for abdominal pain, diarrhea, heartburn, nausea and vomiting.  Genitourinary: Negative for dysuria, flank pain, frequency and urgency.  Musculoskeletal: Positive for back pain and falls. Negative for joint pain and myalgias.  Skin: Negative for itching and rash.       +BCC  Neurological: Positive for weakness. Negative for tremors, seizures and headaches.  Endo/Heme/Allergies: Negative for polydipsia. Does not bruise/bleed easily.  Psychiatric/Behavioral: Positive for substance abuse. Negative for depression. The patient is nervous/anxious. The patient does not have insomnia.      All other systems reviewed and are negative    Past Medical History:  Diagnosis Date   ADD (attention deficit disorder)    Basal cell carcinoma    Chronic headaches    Kidney stone    MRSA (methicillin resistant Staphylococcus aureus)   IVDU (heroin & methamphetamines)  Social History   Tobacco Use   Smoking status: Current Every Day Smoker   Smokeless  tobacco: Never Used  Substance Use Topics   Alcohol use: No    Comment: occasionally   Drug use: Yes    Types: Oxycodone    Family History  Problem Relation Age of Onset   Heart attack Father    Pancreatic cancer Maternal Grandmother    Diabetes Maternal Grandmother      Current Facility-Administered Medications:    0.9 %  sodium chloride infusion, , Intravenous, Continuous, Pahwani, Rinka R, MD, Last Rate: 75 mL/hr at 12/20/18 1104   0.9 %  sodium chloride infusion, 250 mL, Intravenous, PRN, Pahwani, Rinka R, MD   acetaminophen (TYLENOL) tablet 650 mg, 650 mg, Oral, Q6H PRN, Pahwani, Rinka R, MD, 650 mg at 12/20/18 0332   azithromycin (ZITHROMAX) 500 mg in sodium chloride 0.9 % 250 mL IVPB, 500 mg, Intravenous, Q24H, Millen, Jessica B, RPH   ceFAZolin (ANCEF) IVPB 2g/100 mL premix, 2 g, Intravenous, Q8H, Kc, Ramesh, MD   enoxaparin (LOVENOX) injection 40 mg, 40 mg, Subcutaneous, Q24H, Pahwani, Rinka R, MD, 40 mg at 12/19/18 2210   ondansetron (ZOFRAN) injection 4 mg, 4 mg, Intravenous, Q6H PRN, Pahwani, Rinka R, MD   pantoprazole (PROTONIX) injection 40 mg, 40 mg, Intravenous, Q24H, Pahwani, Rinka R, MD, 40 mg at 12/19/18 1940   sodium chloride flush (NS) 0.9 % injection 3  mL, 3 mL, Intravenous, Q12H, Pahwani, Rinka R, MD, 3 mL at 12/19/18 2244   sodium chloride flush (NS) 0.9 % injection 3 mL, 3 mL, Intravenous, PRN, Pahwani, Rinka R, MD  No Known Allergies  Vitals:   12/20/18 0706 12/20/18 1026  BP: 131/84 132/89  Pulse: 100 84  Resp: (!) 21 20  Temp: 98.2 F (36.8 C) 98.2 F (36.8 C)  SpO2: 97% 96%     Physical Exam Gen: disheveled appearance, evasive with several questions, anxious, moderate distress secondary to low back pain, A&Ox 3 Head: NCAT, mild bitemporal wasting evident EENT: PERRL, EOMI, MMM, adequate dentition Neck: supple, no JVD CV: tachycardic rate, RR, I/VI SEM at LLSB Pulm: CTA bilaterally, mild expiratory wheeze, rare retractions on  BNC Abd: soft, NTND, +BS (hypoactive) Extrems:  1+ non-pitting LE edema, 1+ pulses Skin: no rashes,occasional scratches to both LEs, +abrasion to LT forehead, BCC lesion above bridge of nose w/o drainage or erythema, adequate skin turgor Neuro: CN II-XII grossly intact, no focal neurologic deficits appreciated, gait was not assessed, A&Ox 3   MRI T & L spine w/o contrast on 12/19/2018  IMPRESSION: The examination is positive for a septic facet effusion on the right at L4-5 resulting in 2 epidural abscesses. The larger collection emanates from the medial margin of the right L4-5 facet and results in mass effect on the thecal sac which is deflected to the left and narrowed. A second smaller abscess is seen superiorly at the level of the L4 pedicles which causes minimal mass effect on the thecal sac. A small abscess extending posteriorly out of the L4-5 facet is also noted.  Extensive inflammatory change in posterior paraspinous musculature is worse on the right in the multi tip at this and consistent with myositis. No intramuscular abscess is identified.  Normal appearing thoracic spine.  Left worse than right airspace disease consistent with pneumonia.  These results were called by telephone at the time of interpretation on 12/19/2018 at 3:26 pm to [facility] who verbally acknowledged these results.   Electronically Signed   By: Inge Rise M.D.   On: 12/19/2018 15:28  Lab Results  Component Value Date   WBC 14.9 (H) 12/20/2018   HGB 13.3 12/20/2018   HCT 40.3 12/20/2018   MCV 86.1 12/20/2018   PLT 163 12/20/2018    Lab Results  Component Value Date   CREATININE 1.00 12/20/2018   BUN 9 12/20/2018   NA 134 (L) 12/20/2018   K 3.9 12/20/2018   CL 98 12/20/2018   CO2 24 12/20/2018    Lab Results  Component Value Date   ALT 64 (H) 12/20/2018   AST 46 (H) 12/20/2018   ALKPHOS 82 12/20/2018     Microbiology: Recent Results (from the past 240 hour(s))   Blood culture (routine x 2)     Status: Abnormal (Preliminary result)   Collection Time: 12/19/18  9:00 AM   Specimen: BLOOD RIGHT ARM  Result Value Ref Range Status   Specimen Description BLOOD RIGHT ARM  Final   Special Requests   Final    BOTTLES DRAWN AEROBIC AND ANAEROBIC Blood Culture adequate volume   Culture  Setup Time   Final    IN BOTH AEROBIC AND ANAEROBIC BOTTLES GRAM POSITIVE COCCI CRITICAL VALUE NOTED.  VALUE IS CONSISTENT WITH PREVIOUSLY REPORTED AND CALLED VALUE. Performed at [facility], [place] [address] [place], [place] [address]    Culture STAPHYLOCOCCUS AUREUS (A)  Final   Report Status  PENDING  Incomplete  Blood culture (routine x 2)     Status: Abnormal (Preliminary result)   Collection Time: 12/19/18  9:01 AM   Specimen: BLOOD  Result Value Ref Range Status   Specimen Description BLOOD SITE NOT SPECIFIED  Final   Special Requests   Final    BOTTLES DRAWN AEROBIC AND ANAEROBIC Blood Culture results may not be optimal due to an inadequate volume of blood received in culture bottles   Culture  Setup Time   Final    IN BOTH AEROBIC AND ANAEROBIC BOTTLES GRAM POSITIVE COCCI Organism ID to follow CRITICAL RESULT CALLED TO, READ BACK BY AND VERIFIED WITH: PHRMD L SEAY  @0310  12/20/18 BY S GEZAHEGN    Culture (A)  Final    STAPHYLOCOCCUS AUREUS CULTURE REINCUBATED FOR BETTER GROWTH Performed at [facility], [place] [address] [place], [place] [address]    Report Status PENDING  Incomplete  Blood Culture ID Panel (Reflexed)     Status: Abnormal   Collection Time: 12/19/18  9:01 AM  Result Value Ref Range Status   Enterococcus species NOT DETECTED NOT DETECTED Final   Listeria monocytogenes NOT DETECTED NOT DETECTED Final   Staphylococcus species DETECTED (A) NOT DETECTED Final    Comment: CRITICAL RESULT CALLED TO, READ BACK BY AND VERIFIED WITH: PHRMD L SEAY  @0310  12/20/18 BY S GEZAHEGN    Staphylococcus aureus (BCID) DETECTED (A) NOT DETECTED Final      Comment: Methicillin (oxacillin) susceptible Staphylococcus aureus (MSSA). Preferred therapy is anti staphylococcal beta lactam antibiotic (Cefazolin or Nafcillin), unless clinically contraindicated. CRITICAL RESULT CALLED TO, READ BACK BY AND VERIFIED WITH: PHRMD L SEAY  @0310  12/20/18 BY S GEZAHEGN    Methicillin resistance NOT DETECTED NOT DETECTED Final   Streptococcus species NOT DETECTED NOT DETECTED Final   Streptococcus agalactiae NOT DETECTED NOT DETECTED Final   Streptococcus pneumoniae NOT DETECTED NOT DETECTED Final   Streptococcus pyogenes NOT DETECTED NOT DETECTED Final   Acinetobacter baumannii NOT DETECTED NOT DETECTED Final   Enterobacteriaceae species NOT DETECTED NOT DETECTED Final   Enterobacter cloacae complex NOT DETECTED NOT DETECTED Final   Escherichia coli NOT DETECTED NOT DETECTED Final   Klebsiella oxytoca NOT DETECTED NOT DETECTED Final   Klebsiella pneumoniae NOT DETECTED NOT DETECTED Final   Proteus species NOT DETECTED NOT DETECTED Final   Serratia marcescens NOT DETECTED NOT DETECTED Final   Haemophilus influenzae NOT DETECTED NOT DETECTED Final   Neisseria meningitidis NOT DETECTED NOT DETECTED Final   Pseudomonas aeruginosa NOT DETECTED NOT DETECTED Final   Candida albicans NOT DETECTED NOT DETECTED Final   Candida glabrata NOT DETECTED NOT DETECTED Final   Candida krusei NOT DETECTED NOT DETECTED Final   Candida parapsilosis NOT DETECTED NOT DETECTED Final   Candida tropicalis NOT DETECTED NOT DETECTED Final    Comment: Performed at [facility], [place] [address] [place], [place] [address]  SARS Coronavirus 2 [facility] order, Performed in [facility] hospital lab) Nasopharyngeal Nasopharyngeal Swab     Status: None   Collection Time: 12/19/18 11:04 AM   Specimen: Nasopharyngeal Swab  Result Value Ref Range Status   SARS Coronavirus 2 NEGATIVE NEGATIVE Final    Comment: (NOTE) If result is NEGATIVE SARS-CoV-2 target nucleic acids are NOT  DETECTED. The SARS-CoV-2 RNA is generally detectable in upper and lower  respiratory specimens during the acute phase of infection. The lowest  concentration of SARS-CoV-2 viral copies this assay can detect is 250  copies /  mL. A negative result does not preclude SARS-CoV-2 infection  and should not be used as the sole basis for treatment or other  patient management decisions.  A negative result may occur with  improper specimen collection / handling, submission of specimen other  than nasopharyngeal swab, presence of viral mutation(s) within the  areas targeted by this assay, and inadequate number of viral copies  (<250 copies / mL). A negative result must be combined with clinical  observations, patient history, and epidemiological information. If result is POSITIVE SARS-CoV-2 target nucleic acids are DETECTED. The SARS-CoV-2 RNA is generally detectable in upper and lower  respiratory specimens dur ing the acute phase of infection.  Positive  results are indicative of active infection with SARS-CoV-2.  Clinical  correlation with patient history and other diagnostic information is  necessary to determine patient infection status.  Positive results do  not rule out bacterial infection or co-infection with other viruses. If result is PRESUMPTIVE POSTIVE SARS-CoV-2 nucleic acids MAY BE PRESENT.   A presumptive positive result was obtained on the submitted specimen  and confirmed on repeat testing.  While 2019 novel coronavirus  (SARS-CoV-2) nucleic acids may be present in the submitted sample  additional confirmatory testing may be necessary for epidemiological  and / or clinical management purposes  to differentiate between  SARS-CoV-2 and other Sarbecovirus currently known to infect humans.  If clinically indicated additional testing with an alternate test  methodology [phone]) is advised. The SARS-CoV-2 RNA is generally  detectable in upper and lower respiratory sp ecimens during  the acute  phase of infection. The expected result is Negative. Fact Sheet for Patients:  StrictlyIdeas.no Fact Sheet for Healthcare Providers: BankingDealers.co.za This test is not yet approved or cleared by the [place] FDA and has been authorized for detection and/or diagnosis of SARS-CoV-2 by FDA under an Emergency Use Authorization (EUA).  This EUA will remain in effect (meaning this test can be used) for the duration of the COVID-19 declaration under Section 564(b)(1) of the Act, 21 U.S.C. section 360bbb-3(b)(1), unless the authorization is terminated or revoked sooner. Performed at [facility], [place] [address] [place], [place] [address]   MRSA PCR Screening     Status: None   Collection Time: 12/19/18  8:57 PM   Specimen: Nasal Mucosa; Nasopharyngeal  Result Value Ref Range Status   MRSA by PCR NEGATIVE NEGATIVE Final    Comment:        The GeneXpert MRSA Assay (FDA approved for NASAL specimens only), is one component of a comprehensive MRSA colonization surveillance program. It is not intended to diagnose MRSA infection nor to guide or monitor treatment for MRSA infections. Performed at [facility], [place] [address], [place], [place] [address]     Lekeshia Kram N Flo Berroa, [place] for Infectious Disease [facility] [email or web address] 12/20/2018, 12:38 PM

## 2018-12-20 NOTE — Plan of Care (Signed)
  Problem: Education: Goal: Knowledge of General Education information will improve Description: Including pain rating scale, medication(s)/side effects and non-pharmacologic comfort measures Outcome: Progressing   Problem: Clinical Measurements: Goal: Will remain free from infection Outcome: Progressing   Problem: Clinical Measurements: Goal: Diagnostic test results will improve Outcome: Progressing   Problem: Activity: Goal: Risk for activity intolerance will decrease Outcome: Progressing   Problem: Pain Managment: Goal: General experience of comfort will improve Outcome: Progressing   Problem: Safety: Goal: Ability to remain free from injury will improve Outcome: Progressing

## 2018-12-20 NOTE — Progress Notes (Signed)
PHARMACY - PHYSICIAN COMMUNICATION CRITICAL VALUE ALERT - BLOOD CULTURE IDENTIFICATION (BCID)  Bradley Weber is an 43 y.o. male who presented to [facility] on 12/19/2018 with a chief complaint of back pain.  CXR shows PNA.  Has 4/4 BC positive for MSSA.  Has spinal abscess  Assessment:   Name of physician (or Provider) Contacted: K Schorr  Current antibiotics: Rocephin, azithromycin and vancomycin  Changes to prescribed antibiotics recommended:  D/C vanc and start ancef 2gm IV q8 hours.  Will continue rocephin and azithromycin for now.  ID consult to follow.  Results for orders placed or performed during the hospital encounter of 12/19/18  Blood Culture ID Panel (Reflexed) (Collected: 12/19/2018  9:01 AM)  Result Value Ref Range   Enterococcus species NOT DETECTED NOT DETECTED   Listeria monocytogenes NOT DETECTED NOT DETECTED   Staphylococcus species DETECTED (A) NOT DETECTED   Staphylococcus aureus (BCID) DETECTED (A) NOT DETECTED   Methicillin resistance NOT DETECTED NOT DETECTED   Streptococcus species NOT DETECTED NOT DETECTED   Streptococcus agalactiae NOT DETECTED NOT DETECTED   Streptococcus pneumoniae NOT DETECTED NOT DETECTED   Streptococcus pyogenes NOT DETECTED NOT DETECTED   Acinetobacter baumannii NOT DETECTED NOT DETECTED   Enterobacteriaceae species NOT DETECTED NOT DETECTED   Enterobacter cloacae complex NOT DETECTED NOT DETECTED   Escherichia coli NOT DETECTED NOT DETECTED   Klebsiella oxytoca NOT DETECTED NOT DETECTED   Klebsiella pneumoniae NOT DETECTED NOT DETECTED   Proteus species NOT DETECTED NOT DETECTED   Serratia marcescens NOT DETECTED NOT DETECTED   Haemophilus influenzae NOT DETECTED NOT DETECTED   Neisseria meningitidis NOT DETECTED NOT DETECTED   Pseudomonas aeruginosa NOT DETECTED NOT DETECTED   Candida albicans NOT DETECTED NOT DETECTED   Candida glabrata NOT DETECTED NOT DETECTED   Candida krusei NOT DETECTED NOT DETECTED   Candida  parapsilosis NOT DETECTED NOT DETECTED   Candida tropicalis NOT DETECTED NOT DETECTED    [name] 12/20/2018  3:19 AM

## 2018-12-21 ENCOUNTER — Other Ambulatory Visit (HOSPITAL_COMMUNITY): Payer: Medicaid Other

## 2018-12-21 DIAGNOSIS — B1710 Acute hepatitis C without hepatic coma: Secondary | ICD-10-CM

## 2018-12-21 DIAGNOSIS — N179 Acute kidney failure, unspecified: Secondary | ICD-10-CM

## 2018-12-21 DIAGNOSIS — R740 Nonspecific elevation of levels of transaminase and lactic acid dehydrogenase [LDH]: Secondary | ICD-10-CM

## 2018-12-21 LAB — HEPATITIS PANEL, ACUTE
HCV Ab: >11 s/co ratio — ABNORMAL HIGH (ref 0.0–0.9)
Hep A IgM: NEGATIVE
Hep B C IgM: NEGATIVE
Hepatitis B Surface Ag: NEGATIVE

## 2018-12-21 MED ORDER — MORPHINE SULFATE (PF) 2 MG/ML IV SOLN
1.0000 mg | Freq: Four times a day (QID) | INTRAVENOUS | Status: DC | PRN
  Administered 2018-12-21 – 2018-12-24 (×12): 1 mg via INTRAVENOUS
  Filled 2018-12-21 (×12): qty 1

## 2018-12-21 MED ORDER — TRAMADOL HCL 50 MG PO TABS
50.0000 mg | ORAL_TABLET | Freq: Four times a day (QID) | ORAL | Status: DC | PRN
  Administered 2018-12-21 – 2018-12-27 (×12): 50 mg via ORAL
  Filled 2018-12-21 (×12): qty 1

## 2018-12-21 MED ORDER — KETOROLAC TROMETHAMINE 10 MG PO TABS
10.0000 mg | ORAL_TABLET | Freq: Three times a day (TID) | ORAL | Status: DC | PRN
  Filled 2018-12-21 (×2): qty 1

## 2018-12-21 NOTE — Progress Notes (Signed)
Pt's said tylenol is not covering for his pain. RN paged MD, Right now waiting for Toradol from main pharmacy to be dispensed to  provide to the patient  Palma Holter, RN

## 2018-12-21 NOTE — Progress Notes (Signed)
PROGRESS NOTE    Bradley Weber  [id number]  DOB: November 05, 1975  DOA: 12/19/2018 PCP: Patient, No Pcp Per  Brief Narrative:  43 y.o.male history of basal cell carcinoma skin forehead and back, IV drug abuse-doing heroin on and off heroin for 3-4 years- have been in rehab before presented with worsening severe lower back pain since 1-1/2-week, was up to 10/10  NR, without numbness tingling sensation, bowel bladder incontinence.  Last heroin use 2 days PTA.Also complaining of cough fever chills, decreased appetite, weight loss shortness of breath for few days, also complaining of 6 episodes of bloody vomiting, headache, blurred vision & weakness. ED Course: Vitals stable COVID negative MRI shows epidural abscess, chest x-ray pneumonia, neurosurgery was consulted, WAS Given IV Rocephin vancomycin azithromycin and was admitted  Subjective:  Patient sitting in bedside chair, appears comfortable.  His ex-wife is in the room and requesting clinical updates.  Seen by neurosurgery and infectious diseases.  Remains on IV antibiotics.  Objective: Vitals:   12/21/18 1214 12/21/18 1226 12/21/18 1227 12/21/18 1500  BP:  114/77 140/84 (!) 127/94  Pulse: 85   78  Resp:    (!) 24  Temp: 98.1 F (36.7 C)   98.2 F (36.8 C)  TempSrc: Oral   Oral  SpO2: 99%   99%  Weight:      Height:        Intake/Output Summary (Last 24 hours) at 12/21/2018 1802 Last data filed at 12/21/2018 1753 Gross per 24 hour  Intake 2859.98 ml  Output 950 ml  Net 1909.98 ml   Filed Weights   12/19/18 0518 12/19/18 2036  Weight: 79.4 kg 76.1 kg    Physical Examination:  General exam: Appears calm and comfortable  Respiratory system: Clear to auscultation. Respiratory effort normal. Cardiovascular system: S1 & S2 heard, RRR. No JVD, murmurs, rubs, gallops or clicks. No pedal edema. Gastrointestinal system: Abdomen is nondistended, soft and nontender. No organomegaly or masses felt. Normal bowel sounds heard.  Central nervous system: Alert and oriented.  Decreased strength in lower extremities to 3/5, intact sensations.   Extremities: Limited range of motion along right lower extremity extension due to back pain.  No edema Skin: No rashes, lesions or ulcers Psychiatry: Judgement and insight appear normal. Mood & affect appropriate.     Data Reviewed: I have personally reviewed following labs and imaging studies  CBC: Recent Labs  Lab 12/19/18 0530 12/19/18 2143 12/20/18 0207  WBC 29.5* 13.9* 14.9*  NEUTROABS  --   --  12.9*  HGB 16.0 13.7 13.3  HCT 46.9 40.4 40.3  MCV 85.4 84.9 86.1  PLT 205 169 [id number]   Basic Metabolic Panel: Recent Labs  Lab 12/19/18 0530 12/19/18 2143 12/20/18 0207  NA 137  --  134*  K 3.7  --  3.9  CL 98  --  98  CO2 25  --  24  GLUCOSE 103*  --  128*  BUN 16  --  9  CREATININE 1.92* 1.16 1.00  CALCIUM 9.1  --  8.5*  MG  --  1.7  --    GFR: Estimated Creatinine Clearance: 101.4 mL/min (by C-G formula based on SCr of 1 mg/dL). Liver Function Tests: Recent Labs  Lab 12/19/18 0530 12/20/18 0207  AST 70* 46*  ALT 97* 64*  ALKPHOS 112 82  BILITOT 1.8* 1.2  PROT 7.4 6.4*  ALBUMIN 3.6 2.8*   Recent Labs  Lab 12/19/18 0530  LIPASE 20   No results for input(s):  AMMONIA in the last 168 hours. Coagulation Profile: Recent Labs  Lab 12/19/18 2143  INR 1.1   Cardiac Enzymes: No results for input(s): CKTOTAL, CKMB, CKMBINDEX, TROPONINI in the last 168 hours. BNP (last 3 results) No results for input(s): PROBNP in the last 8760 hours. HbA1C: No results for input(s): HGBA1C in the last 72 hours. CBG: No results for input(s): GLUCAP in the last 168 hours. Lipid Profile: No results for input(s): CHOL, HDL, LDLCALC, TRIG, CHOLHDL, LDLDIRECT in the last 72 hours. Thyroid Function Tests: No results for input(s): TSH, T4TOTAL, FREET4, T3FREE, THYROIDAB in the last 72 hours. Anemia Panel: No results for input(s): VITAMINB12, FOLATE, FERRITIN, TIBC,  IRON, RETICCTPCT in the last 72 hours. Sepsis Labs: Recent Labs  Lab 12/19/18 0855  LATICACIDVEN 1.9    Recent Results (from the past 240 hour(s))  Blood culture (routine x 2)     Status: Abnormal (Preliminary result)   Collection Time: 12/19/18  9:00 AM   Specimen: BLOOD RIGHT ARM  Result Value Ref Range Status   Specimen Description BLOOD RIGHT ARM  Final   Special Requests   Final    BOTTLES DRAWN AEROBIC AND ANAEROBIC Blood Culture adequate volume   Culture  Setup Time   Final    IN BOTH AEROBIC AND ANAEROBIC BOTTLES GRAM POSITIVE COCCI CRITICAL VALUE NOTED.  VALUE IS CONSISTENT WITH PREVIOUSLY REPORTED AND CALLED VALUE. Performed at [facility], [place] [address] [place], [place] [address]    Culture STAPHYLOCOCCUS AUREUS (A)  Final   Report Status PENDING  Incomplete  Blood culture (routine x 2)     Status: Abnormal (Preliminary result)   Collection Time: 12/19/18  9:01 AM   Specimen: BLOOD  Result Value Ref Range Status   Specimen Description BLOOD SITE NOT SPECIFIED  Final   Special Requests   Final    BOTTLES DRAWN AEROBIC AND ANAEROBIC Blood Culture results may not be optimal due to an inadequate volume of blood received in culture bottles   Culture  Setup Time   Final    IN BOTH AEROBIC AND ANAEROBIC BOTTLES GRAM POSITIVE COCCI Organism ID to follow CRITICAL RESULT CALLED TO, READ BACK BY AND VERIFIED WITH: PHRMD L SEAY  @0310  12/20/18 BY S GEZAHEGN    Culture (A)  Final    STAPHYLOCOCCUS AUREUS SUSCEPTIBILITIES TO FOLLOW Performed at [facility], [place] [address], [place],  [address]    Report Status PENDING  Incomplete  Blood Culture ID Panel (Reflexed)     Status: Abnormal   Collection Time: 12/19/18  9:01 AM  Result Value Ref Range Status   Enterococcus species NOT DETECTED NOT DETECTED Final   Listeria monocytogenes NOT DETECTED NOT DETECTED Final   Staphylococcus species DETECTED (A) NOT DETECTED Final    Comment: CRITICAL RESULT CALLED  TO, READ BACK BY AND VERIFIED WITH: PHRMD L SEAY  @0310  12/20/18 BY S GEZAHEGN    Staphylococcus aureus (BCID) DETECTED (A) NOT DETECTED Final    Comment: Methicillin (oxacillin) susceptible Staphylococcus aureus (MSSA). Preferred therapy is anti staphylococcal beta lactam antibiotic (Cefazolin or Nafcillin), unless clinically contraindicated. CRITICAL RESULT CALLED TO, READ BACK BY AND VERIFIED WITH: PHRMD L SEAY  @0310  12/20/18 BY S GEZAHEGN    Methicillin resistance NOT DETECTED NOT DETECTED Final   Streptococcus species NOT DETECTED NOT DETECTED Final   Streptococcus agalactiae NOT DETECTED NOT DETECTED Final   Streptococcus pneumoniae NOT DETECTED NOT DETECTED Final   Streptococcus pyogenes NOT DETECTED NOT DETECTED Final  Acinetobacter baumannii NOT DETECTED NOT DETECTED Final   Enterobacteriaceae species NOT DETECTED NOT DETECTED Final   Enterobacter cloacae complex NOT DETECTED NOT DETECTED Final   Escherichia coli NOT DETECTED NOT DETECTED Final   Klebsiella oxytoca NOT DETECTED NOT DETECTED Final   Klebsiella pneumoniae NOT DETECTED NOT DETECTED Final   Proteus species NOT DETECTED NOT DETECTED Final   Serratia marcescens NOT DETECTED NOT DETECTED Final   Haemophilus influenzae NOT DETECTED NOT DETECTED Final   Neisseria meningitidis NOT DETECTED NOT DETECTED Final   Pseudomonas aeruginosa NOT DETECTED NOT DETECTED Final   Candida albicans NOT DETECTED NOT DETECTED Final   Candida glabrata NOT DETECTED NOT DETECTED Final   Candida krusei NOT DETECTED NOT DETECTED Final   Candida parapsilosis NOT DETECTED NOT DETECTED Final   Candida tropicalis NOT DETECTED NOT DETECTED Final    Comment: Performed at [facility], [place] [address], [place], [place] [address]  SARS Coronavirus 2 [facility] order, Performed in [facility] hospital lab) Nasopharyngeal Nasopharyngeal Swab     Status: None   Collection Time: 12/19/18 11:04 AM   Specimen: Nasopharyngeal Swab  Result Value Ref  Range Status   SARS Coronavirus 2 NEGATIVE NEGATIVE Final    Comment: (NOTE) If result is NEGATIVE SARS-CoV-2 target nucleic acids are NOT DETECTED. The SARS-CoV-2 RNA is generally detectable in upper and lower  respiratory specimens during the acute phase of infection. The lowest  concentration of SARS-CoV-2 viral copies this assay can detect is 250  copies / mL. A negative result does not preclude SARS-CoV-2 infection  and should not be used as the sole basis for treatment or other  patient management decisions.  A negative result may occur with  improper specimen collection / handling, submission of specimen other  than nasopharyngeal swab, presence of viral mutation(s) within the  areas targeted by this assay, and inadequate number of viral copies  (<250 copies / mL). A negative result must be combined with clinical  observations, patient history, and epidemiological information. If result is POSITIVE SARS-CoV-2 target nucleic acids are DETECTED. The SARS-CoV-2 RNA is generally detectable in upper and lower  respiratory specimens dur ing the acute phase of infection.  Positive  results are indicative of active infection with SARS-CoV-2.  Clinical  correlation with patient history and other diagnostic information is  necessary to determine patient infection status.  Positive results do  not rule out bacterial infection or co-infection with other viruses. If result is PRESUMPTIVE POSTIVE SARS-CoV-2 nucleic acids MAY BE PRESENT.   A presumptive positive result was obtained on the submitted specimen  and confirmed on repeat testing.  While 2019 novel coronavirus  (SARS-CoV-2) nucleic acids may be present in the submitted sample  additional confirmatory testing may be necessary for epidemiological  and / or clinical management purposes  to differentiate between  SARS-CoV-2 and other Sarbecovirus currently known to infect humans.  If clinically indicated additional testing with an  alternate test  methodology [phone]) is advised. The SARS-CoV-2 RNA is generally  detectable in upper and lower respiratory sp ecimens during the acute  phase of infection. The expected result is Negative. Fact Sheet for Patients:  StrictlyIdeas.no Fact Sheet for Healthcare Providers: BankingDealers.co.za This test is not yet approved or cleared by the [place] FDA and has been authorized for detection and/or diagnosis of SARS-CoV-2 by FDA under an Emergency Use Authorization (EUA).  This EUA will remain in effect (meaning this test can be used) for the duration of the COVID-19  declaration under Section 564(b)(1) of the Act, 21 U.S.C. section 360bbb-3(b)(1), unless the authorization is terminated or revoked sooner. Performed at [facility], [place] [address], [place], [place] [address]   MRSA PCR Screening     Status: None   Collection Time: 12/19/18  8:57 PM   Specimen: Nasal Mucosa; Nasopharyngeal  Result Value Ref Range Status   MRSA by PCR NEGATIVE NEGATIVE Final    Comment:        The GeneXpert MRSA Assay (FDA approved for NASAL specimens only), is one component of a comprehensive MRSA colonization surveillance program. It is not intended to diagnose MRSA infection nor to guide or monitor treatment for MRSA infections. Performed at [facility], [place] [address], [place], [place] [address]       Radiology Studies: No results found.      Scheduled Meds: . enoxaparin (LOVENOX) injection  40 mg Subcutaneous Q24H  . pantoprazole (PROTONIX) IV  40 mg Intravenous Q24H  . sodium chloride flush  3 mL Intravenous Q12H   Continuous Infusions: . sodium chloride 75 mL/hr at 12/21/18 1457  . sodium chloride    .  ceFAZolin (ANCEF) IV 2 g (12/21/18 1410)    Assessment & Plan:    1.  Sepsis secondary to MSSA bacteremia/Right sided endocarditis/L4-L5 epidural abscess/left-sided pneumonia: Patient seen by  infectious diseases and neurosurgery.  Antibiotics narrowed to IV cefazolin. Repeat blood cultures sent by infectious diseases.  Neurosurgery recommends medical management.  ID feels patient may need drainage of epidural abscess through IR if patient persistently bacteremic and unable to clear blood cultures.  Leukocytosis significantly improved since admission with antibiotics.  Transthoracic echo reports mild thickening of the mitral valve leaflet and moderately sized vegetation on the tricuspid valve. Will await recommendations by ID regarding TEE.  2.  Epidural abscess: Neurosurgery/ID recommendations as above.  Patient slowly improving in terms of lower extremity strength.  Able to ambulate without assistive device.  Still has pain on right lower extremity extension.  Afebrile in last 24 hours and leukocytosis improving.  Will order low-dose opiates for back pain.  3. Community-acquired pneumonia: Continue IV cefazolin.  Not on any atypical coverage.  Strep pneumo antigen/Legionella antigen negative.  Saturating well on room air.  4.  Tricuspid valve endocarditis: Continue IV antibiotics.  Will await ID follow-up and recommendations.    5.  Polysubstance abuse: Patient does have a history of IV drug abuse although he denies any recent use especially in presence of ex-wife in the room.  Urine drug screen positive for amphetamines and opiates on 9/3.  UDS was also positive for amphetamines and benzodiazepines and April 2013.  Patient likely an IV drug abuser which contributed to problem #1.  6.  Elevated LFTs/hepatitis C: Patient had elevated liver enzymes on presentation.  Alcohol level was negative.  HIV/hepatitis A/hepatitis B panel are negative but hepatitis C antibody elevated in the setting of IV drug abuse.  Will follow-up ID recommendations.  7. AKI: Resolved with IV hydration.  Avoid nephrotoxins.  DVT prophylaxis: Lovenox Code Status: Full code Family / Patient Communication: Discussed  with patient and with his permission discussed with ex-wife Ms. Bobbe Medico who was at bedside. Disposition Plan: TBD     LOS: 2 days    Time spent: 35 minutes    Guilford Shi, MD [facility] Pager [phone]  If 7PM-7AM, please contact night-coverage [email or web address] Password [email or web address] 12/21/2018, 6:02 PM

## 2018-12-21 NOTE — Progress Notes (Signed)
Pt has exwife as a visitor in his room requested for a copy of his echo and labs same given, with agreement from patient.

## 2018-12-22 LAB — CULTURE, BLOOD (ROUTINE X 2): Special Requests: ADEQUATE

## 2018-12-22 LAB — CBC
HCT: 36.3 % — ABNORMAL LOW (ref 39.0–52.0)
Hemoglobin: 12.8 g/dL — ABNORMAL LOW (ref 13.0–17.0)
MCH: 28.4 pg (ref 26.0–34.0)
MCHC: 35.3 g/dL (ref 30.0–36.0)
MCV: 80.5 fL (ref 80.0–100.0)
Platelets: 226 10*3/uL (ref 150–400)
RBC: 4.51 MIL/uL (ref 4.22–5.81)
RDW: 11.9 % (ref 11.5–15.5)
WBC: 8.6 10*3/uL (ref 4.0–10.5)
nRBC: 0 % (ref 0.0–0.2)

## 2018-12-22 MED ORDER — PANTOPRAZOLE SODIUM 40 MG PO TBEC
40.0000 mg | DELAYED_RELEASE_TABLET | Freq: Every day | ORAL | Status: DC
  Administered 2018-12-22 – 2019-01-07 (×17): 40 mg via ORAL
  Filled 2018-12-22 (×17): qty 1

## 2018-12-22 NOTE — Progress Notes (Signed)
Patient continues to complain of 10 out of 10 lower back pain from spinal abscess.  Patient and ex wife, Judeen Hammans, have stated dissatisfaction in his pain management despite some pain medication (see MAR) and heating pad.  MD has been paged multiple times over this matter with no change to the plan of care.  Emotional support provided to both patient and Judeen Hammans.  Patient has stated he may want to leave AMA if he is unable to get pain relief.  Provider notified, still no change to regimen.  Discussed this with both patient and Judeen Hammans.  Will continue to monitor.

## 2018-12-22 NOTE — Plan of Care (Signed)
  Problem: Education: Goal: Knowledge of General Education information will improve Description: Including pain rating scale, medication(s)/side effects and non-pharmacologic comfort measures Outcome: Progressing   Problem: Health Behavior/Discharge Planning: Goal: Ability to manage health-related needs will improve Outcome: Progressing   Problem: Clinical Measurements: Goal: Ability to maintain clinical measurements within normal limits will improve Outcome: Progressing Goal: Will remain free from infection Outcome: Progressing Goal: Diagnostic test results will improve Outcome: Progressing Goal: Cardiovascular complication will be avoided Outcome: Progressing   Problem: Activity: Goal: Risk for activity intolerance will decrease Outcome: Progressing   Problem: Elimination: Goal: Will not experience complications related to bowel motility Outcome: Progressing Goal: Will not experience complications related to urinary retention Outcome: Progressing   Problem: Pain Managment: Goal: General experience of comfort will improve Outcome: Progressing   Problem: Safety: Goal: Ability to remain free from injury will improve Outcome: Progressing   Problem: Skin Integrity: Goal: Risk for impaired skin integrity will decrease Outcome: Progressing

## 2018-12-22 NOTE — Progress Notes (Signed)
Pt's pain is under control with Morphine and Tramadol so far Pt's mother is in bedside and is updated Pt does not want to give his medical information without his consent and change his contact information, registration been contacted by secretary and his ex-wife name is taken off.  Pt is up and ambulating, denies SOB, distress and chest pain, will continue to monitor the patient  Palma Holter, RN

## 2018-12-22 NOTE — Progress Notes (Addendum)
[facility] NOTE    CORVUS Weber  [id number]  DOB: [birth year]  DOA: 12/19/2018 PCP: Patient, No Pcp Per  Brief Narrative:  43 y.o.male history of basal cell carcinoma skin forehead and back, IV drug abuse-doing heroin on and off heroin for 3-4 years- have been in rehab before presented with worsening severe lower back pain since 1-1/2-week, was up to 10/10  NR, without numbness tingling sensation, bowel bladder incontinence.  Last heroin use 2 days PTA.Also complaining of cough fever chills, decreased appetite, weight loss shortness of breath for few days, also complaining of 6 episodes of bloody vomiting, headache, blurred vision & weakness. ED Course: Vitals stable COVID negative MRI shows epidural abscess, chest x-ray pneumonia, neurosurgery was consulted, WAS Given IV Rocephin vancomycin azithromycin and was admitted  Subjective:  Patient sitting in bedside chair, appears comfortable.Mother bedside.  T-max of 100.8 in last 24 hours.  Remains on IV antibiotics.  Objective: Vitals:   12/22/18 0746 12/22/18 1032 12/22/18 1200 12/22/18 1514  BP: (!) 142/95 122/83  128/87  Pulse:  79    Resp: 18 18 12    Temp: 98.7 F (37.1 C) 98.7 F (37.1 C)  98.7 F (37.1 C)  TempSrc: Oral Oral  Oral  SpO2: 96% 92% 94% 100%  Weight:      Height:        Intake/Output Summary (Last 24 hours) at 12/22/2018 1758 Last data filed at 12/22/2018 1150 Gross per 24 hour  Intake 3484.56 ml  Output 2725 ml  Net 759.56 ml   Filed Weights   12/19/18 0518 12/19/18 2036  Weight: 79.4 kg 76.1 kg    Physical Examination:  General exam: Appears calm and comfortable  Respiratory system: Clear to auscultation. Respiratory effort normal. Cardiovascular system: S1 & S2 heard, RRR. No JVD, murmurs, rubs, gallops or clicks. No pedal edema. Gastrointestinal system: Abdomen is nondistended, soft and nontender. No organomegaly or masses felt. Normal bowel sounds heard. Central nervous system: Alert and  oriented.  Decreased strength in lower extremities to 3/5, intact sensations.   Extremities: Limited range of motion along right lower extremity extension due to back pain.  No edema Skin: No rashes, lesions or ulcers Psychiatry: Judgement and insight appear normal. Mood & affect appropriate.     Data Reviewed: I have personally reviewed following labs and imaging studies  CBC: Recent Labs  Lab 12/19/18 0530 12/19/18 2143 12/20/18 0207 12/22/18 1239  WBC 29.5* 13.9* 14.9* 8.6  NEUTROABS  --   --  12.9*  --   HGB 16.0 13.7 13.3 12.8*  HCT 46.9 40.4 40.3 36.3*  MCV 85.4 84.9 86.1 80.5  PLT 205 169 163 [id number]   Basic Metabolic Panel: Recent Labs  Lab 12/19/18 0530 12/19/18 2143 12/20/18 0207  NA 137  --  134*  K 3.7  --  3.9  CL 98  --  98  CO2 25  --  24  GLUCOSE 103*  --  128*  BUN 16  --  9  CREATININE 1.92* 1.16 1.00  CALCIUM 9.1  --  8.5*  MG  --  1.7  --    GFR: Estimated Creatinine Clearance: 101.4 mL/min (by C-G formula based on SCr of 1 mg/dL). Liver Function Tests: Recent Labs  Lab 12/19/18 0530 12/20/18 0207  AST 70* 46*  ALT 97* 64*  ALKPHOS 112 82  BILITOT 1.8* 1.2  PROT 7.4 6.4*  ALBUMIN 3.6 2.8*   Recent Labs  Lab 12/19/18 0530  LIPASE 20   No  results for input(s): AMMONIA in the last 168 hours. Coagulation Profile: Recent Labs  Lab 12/19/18 2143  INR 1.1   Cardiac Enzymes: No results for input(s): CKTOTAL, CKMB, CKMBINDEX, TROPONINI in the last 168 hours. BNP (last 3 results) No results for input(s): PROBNP in the last 8760 hours. HbA1C: No results for input(s): HGBA1C in the last 72 hours. CBG: No results for input(s): GLUCAP in the last 168 hours. Lipid Profile: No results for input(s): CHOL, HDL, LDLCALC, TRIG, CHOLHDL, LDLDIRECT in the last 72 hours. Thyroid Function Tests: No results for input(s): TSH, T4TOTAL, FREET4, T3FREE, THYROIDAB in the last 72 hours. Anemia Panel: No results for input(s): VITAMINB12, FOLATE,  FERRITIN, TIBC, IRON, RETICCTPCT in the last 72 hours. Sepsis Labs: Recent Labs  Lab 12/19/18 0855  LATICACIDVEN 1.9    Recent Results (from the past 240 hour(s))  Blood culture (routine x 2)     Status: Abnormal   Collection Time: 12/19/18  9:00 AM   Specimen: BLOOD RIGHT ARM  Result Value Ref Range Status   Specimen Description BLOOD RIGHT ARM  Final   Special Requests   Final    BOTTLES DRAWN AEROBIC AND ANAEROBIC Blood Culture adequate volume   Culture  Setup Time   Final    IN BOTH AEROBIC AND ANAEROBIC BOTTLES GRAM POSITIVE COCCI CRITICAL VALUE NOTED.  VALUE IS CONSISTENT WITH PREVIOUSLY REPORTED AND CALLED VALUE.    Culture (A)  Final    STAPHYLOCOCCUS AUREUS SUSCEPTIBILITIES PERFORMED ON PREVIOUS CULTURE WITHIN THE LAST 5 DAYS. Performed at [facility], [place] [address] [place], [place] [address]    Report Status 12/22/2018 FINAL  Final  Blood culture (routine x 2)     Status: Abnormal   Collection Time: 12/19/18  9:01 AM   Specimen: BLOOD  Result Value Ref Range Status   Specimen Description BLOOD SITE NOT SPECIFIED  Final   Special Requests   Final    BOTTLES DRAWN AEROBIC AND ANAEROBIC Blood Culture results may not be optimal due to an inadequate volume of blood received in culture bottles   Culture  Setup Time   Final    IN BOTH AEROBIC AND ANAEROBIC BOTTLES GRAM POSITIVE COCCI Organism ID to follow CRITICAL RESULT CALLED TO, READ BACK BY AND VERIFIED WITH: PHRMD L SEAY  @0310  12/20/18 BY S GEZAHEGN Performed at [facility], [place] [address] [place],  [address]    Culture STAPHYLOCOCCUS AUREUS (A)  Final   Report Status 12/22/2018 FINAL  Final   Organism ID, Bacteria STAPHYLOCOCCUS AUREUS  Final      Susceptibility   Staphylococcus aureus - MIC*    CIPROFLOXACIN <=0.5 SENSITIVE Sensitive     ERYTHROMYCIN <=0.25 SENSITIVE Sensitive     GENTAMICIN <=0.5 SENSITIVE Sensitive     OXACILLIN 0.5 SENSITIVE Sensitive     TETRACYCLINE >=16 RESISTANT  Resistant     VANCOMYCIN 1 SENSITIVE Sensitive     TRIMETH/SULFA <=10 SENSITIVE Sensitive     CLINDAMYCIN <=0.25 SENSITIVE Sensitive     RIFAMPIN <=0.5 SENSITIVE Sensitive     Inducible Clindamycin NEGATIVE Sensitive     * STAPHYLOCOCCUS AUREUS  Blood Culture ID Panel (Reflexed)     Status: Abnormal   Collection Time: 12/19/18  9:01 AM  Result Value Ref Range Status   Enterococcus species NOT DETECTED NOT DETECTED Final   Listeria monocytogenes NOT DETECTED NOT DETECTED Final   Staphylococcus species DETECTED (A) NOT DETECTED Final    Comment: CRITICAL RESULT CALLED TO,  READ BACK BY AND VERIFIED WITH: PHRMD L SEAY  @0310  12/20/18 BY S GEZAHEGN    Staphylococcus aureus (BCID) DETECTED (A) NOT DETECTED Final    Comment: Methicillin (oxacillin) susceptible Staphylococcus aureus (MSSA). Preferred therapy is anti staphylococcal beta lactam antibiotic (Cefazolin or Nafcillin), unless clinically contraindicated. CRITICAL RESULT CALLED TO, READ BACK BY AND VERIFIED WITH: PHRMD L SEAY  @0310  12/20/18 BY S GEZAHEGN    Methicillin resistance NOT DETECTED NOT DETECTED Final   Streptococcus species NOT DETECTED NOT DETECTED Final   Streptococcus agalactiae NOT DETECTED NOT DETECTED Final   Streptococcus pneumoniae NOT DETECTED NOT DETECTED Final   Streptococcus pyogenes NOT DETECTED NOT DETECTED Final   Acinetobacter baumannii NOT DETECTED NOT DETECTED Final   Enterobacteriaceae species NOT DETECTED NOT DETECTED Final   Enterobacter cloacae complex NOT DETECTED NOT DETECTED Final   Escherichia coli NOT DETECTED NOT DETECTED Final   Klebsiella oxytoca NOT DETECTED NOT DETECTED Final   Klebsiella pneumoniae NOT DETECTED NOT DETECTED Final   Proteus species NOT DETECTED NOT DETECTED Final   Serratia marcescens NOT DETECTED NOT DETECTED Final   Haemophilus influenzae NOT DETECTED NOT DETECTED Final   Neisseria meningitidis NOT DETECTED NOT DETECTED Final   Pseudomonas aeruginosa NOT DETECTED NOT  DETECTED Final   Candida albicans NOT DETECTED NOT DETECTED Final   Candida glabrata NOT DETECTED NOT DETECTED Final   Candida krusei NOT DETECTED NOT DETECTED Final   Candida parapsilosis NOT DETECTED NOT DETECTED Final   Candida tropicalis NOT DETECTED NOT DETECTED Final    Comment: Performed at [facility], [place] [address] [place], [place] [address]  SARS Coronavirus 2 [facility] order, Performed in [facility] hospital lab) Nasopharyngeal Nasopharyngeal Swab     Status: None   Collection Time: 12/19/18 11:04 AM   Specimen: Nasopharyngeal Swab  Result Value Ref Range Status   SARS Coronavirus 2 NEGATIVE NEGATIVE Final    Comment: (NOTE) If result is NEGATIVE SARS-CoV-2 target nucleic acids are NOT DETECTED. The SARS-CoV-2 RNA is generally detectable in upper and lower  respiratory specimens during the acute phase of infection. The lowest  concentration of SARS-CoV-2 viral copies this assay can detect is 250  copies / mL. A negative result does not preclude SARS-CoV-2 infection  and should not be used as the sole basis for treatment or other  patient management decisions.  A negative result may occur with  improper specimen collection / handling, submission of specimen other  than nasopharyngeal swab, presence of viral mutation(s) within the  areas targeted by this assay, and inadequate number of viral copies  (<250 copies / mL). A negative result must be combined with clinical  observations, patient history, and epidemiological information. If result is POSITIVE SARS-CoV-2 target nucleic acids are DETECTED. The SARS-CoV-2 RNA is generally detectable in upper and lower  respiratory specimens dur ing the acute phase of infection.  Positive  results are indicative of active infection with SARS-CoV-2.  Clinical  correlation with patient history and other diagnostic information is  necessary to determine patient infection status.  Positive results do  not rule out bacterial  infection or co-infection with other viruses. If result is PRESUMPTIVE POSTIVE SARS-CoV-2 nucleic acids MAY BE PRESENT.   A presumptive positive result was obtained on the submitted specimen  and confirmed on repeat testing.  While 2019 novel coronavirus  (SARS-CoV-2) nucleic acids may be present in the submitted sample  additional confirmatory testing may be necessary for epidemiological  and / or clinical management purposes  to differentiate between  SARS-CoV-2 and other Sarbecovirus currently known to infect humans.  If clinically indicated additional testing with an alternate test  methodology [phone]) is advised. The SARS-CoV-2 RNA is generally  detectable in upper and lower respiratory sp ecimens during the acute  phase of infection. The expected result is Negative. Fact Sheet for Patients:  StrictlyIdeas.no Fact Sheet for Healthcare Providers: BankingDealers.co.za This test is not yet approved or cleared by the [place] FDA and has been authorized for detection and/or diagnosis of SARS-CoV-2 by FDA under an Emergency Use Authorization (EUA).  This EUA will remain in effect (meaning this test can be used) for the duration of the COVID-19 declaration under Section 564(b)(1) of the Act, 21 U.S.C. section 360bbb-3(b)(1), unless the authorization is terminated or revoked sooner. Performed at [facility], [place] [address] [place], [place] [address]   MRSA PCR Screening     Status: None   Collection Time: 12/19/18  8:57 PM   Specimen: Nasal Mucosa; Nasopharyngeal  Result Value Ref Range Status   MRSA by PCR NEGATIVE NEGATIVE Final    Comment:        The GeneXpert MRSA Assay (FDA approved for NASAL specimens only), is one component of a comprehensive MRSA colonization surveillance program. It is not intended to diagnose MRSA infection nor to guide or monitor treatment for MRSA infections. Performed at [facility], [place] [address] [place], [place] [address]   Culture, blood (routine x 2)     Status: None (Preliminary result)   Collection Time: 12/21/18  5:39 AM   Specimen: BLOOD  Result Value Ref Range Status   Specimen Description BLOOD LEFT ANTECUBITAL  Final   Special Requests   Final    BOTTLES DRAWN AEROBIC ONLY Blood Culture adequate volume   Culture   Final    NO GROWTH 1 DAY Performed at [facility], [place] [address] [place], [place] [address]    Report Status PENDING  Incomplete  Culture, blood (routine x 2)     Status: None (Preliminary result)   Collection Time: 12/21/18  5:45 AM   Specimen: BLOOD RIGHT HAND  Result Value Ref Range Status   Specimen Description BLOOD RIGHT HAND  Final   Special Requests   Final    BOTTLES DRAWN AEROBIC ONLY Blood Culture adequate volume   Culture   Final    NO GROWTH 1 DAY Performed at [facility], [place] [address] [place], [place] [address]    Report Status PENDING  Incomplete      Radiology Studies: No results found.      Scheduled Meds: . enoxaparin (LOVENOX) injection  40 mg Subcutaneous Q24H  . pantoprazole  40 mg Oral Daily  . sodium chloride flush  3 mL Intravenous Q12H   Continuous Infusions: . sodium chloride 75 mL/hr at 12/22/18 0521  . sodium chloride    .  ceFAZolin (ANCEF) IV 2 g (12/22/18 1325)    Assessment & Plan:    1.  Sepsis secondary to MSSA bacteremia/Right sided endocarditis/L4-L5 epidural abscess/left-sided pneumonia: Patient seen by infectious diseases and neurosurgery.  Antibiotics narrowed to IV cefazolin. Repeat blood cultures sent by infectious diseases.  Neurosurgery recommends medical management.  ID feels patient may need drainage of epidural abscess through IR if patient persistently bacteremic and unable to clear blood cultures.  Leukocytosis significantly improved since admission with antibiotics.  Transthoracic echo reports mild thickening of the mitral valve leaflet and  moderately  sized vegetation on the tricuspid valve. Will await further recommendations by ID  2.  Epidural abscess: Neurosurgery/ID recommendations as above.  Patient slowly improving in terms of lower extremity strength.  Able to ambulate without assistive device.  Still has pain on right lower extremity extension.  Had temp spike of 100.8 but leukocytosis improving. Ordered low-dose opiates for back pain.  3. Community-acquired pneumonia: Continue IV cefazolin.  Not on any atypical coverage.  Strep pneumo antigen/Legionella antigen negative.  Saturating well on room air.  4.  Tricuspid valve endocarditis: Continue IV antibiotics.  Will await ID follow-up and recommendations.   (Epic message sent to on call MD-Dr Graylon Good) . May or may not need TEE  5.  Polysubstance abuse: Patient does have a history of IV drug abuse although he denies any recent use especially in presence of family in the room.  Urine drug screen positive for amphetamines and opiates on 9/3.  UDS was also positive for amphetamines and benzodiazepines and April 2013.  Patient likely an IV drug abuser which contributed to problem #1.  6.  Elevated LFTs/hepatitis C: Patient had elevated liver enzymes on presentation.  Alcohol level was negative.  HIV/hepatitis A/hepatitis B panel are negative but hepatitis C antibody elevated in the setting of IV drug abuse.  Will follow-up ID recommendations.  7. AKI: Resolved with IV hydration.  Avoid nephrotoxins.  DVT prophylaxis: Lovenox Code Status: Full code Family / Patient Communication: Discussed with patient and with his permission discussed with ex-wife Ms. Bobbe Medico yesterday. Today his mother is bedside. Patient now says he doesn't want any information to be shared with Judeen Hammans and that he would like to change the emergency contact info. Patient says he doesn't like to get confrontational, hence unable to tell his ex -wife to stay out of his medical record. He is not sure if he  wants mother to be listed as emergency contact / health care proxy either. He has a twin brother and another sibling. He said he will think about it and update [place]. Meanwhile, requested bedside RN to follow-up with patient and update contact information as he wishes.  Also encourage patient to fill out advance directives with healthcare proxy listed.  He was open to this idea and consult discharge planning placed. Disposition Plan: TBD     LOS: 3 days    Time spent: 35 minutes    Guilford Shi, MD [facility] Pager [phone]  If 7PM-7AM, please contact night-coverage [email or web address] Password [facility] 12/22/2018, 5:58 PM

## 2018-12-22 NOTE — Progress Notes (Signed)
Pt is alert and oriented times 4. Pt does not want to give his any information to his ex-wife Judeen Hammans who is mentioned as a spouse in [facility]. When his Ex wife called, RN did not disclose any information per pt request. MD aware, MD talked to the patient regarding advance directive. Will continue to monitor the patient  Palma Holter, RN

## 2018-12-23 ENCOUNTER — Inpatient Hospital Stay (HOSPITAL_COMMUNITY): Payer: Self-pay

## 2018-12-23 LAB — CBC
HCT: 35.1 % — ABNORMAL LOW (ref 39.0–52.0)
Hemoglobin: 12.4 g/dL — ABNORMAL LOW (ref 13.0–17.0)
MCH: 28.2 pg (ref 26.0–34.0)
MCHC: 35.3 g/dL (ref 30.0–36.0)
MCV: 80 fL (ref 80.0–100.0)
Platelets: 237 10*3/uL (ref 150–400)
RBC: 4.39 MIL/uL (ref 4.22–5.81)
RDW: 12 % (ref 11.5–15.5)
WBC: 9 10*3/uL (ref 4.0–10.5)
nRBC: 0 % (ref 0.0–0.2)

## 2018-12-23 LAB — BASIC METABOLIC PANEL
Anion gap: 12 (ref 5–15)
BUN: 8 mg/dL (ref 6–20)
CO2: 24 mmol/L (ref 22–32)
Calcium: 8.3 mg/dL — ABNORMAL LOW (ref 8.9–10.3)
Chloride: 95 mmol/L — ABNORMAL LOW (ref 98–111)
Creatinine, Ser: 0.62 mg/dL (ref 0.61–1.24)
GFR calc Af Amer: >60 mL/min (ref >60)
GFR calc non Af Amer: >60 mL/min (ref >60)
Glucose, Bld: 114 mg/dL — ABNORMAL HIGH (ref 70–99)
Potassium: 2.9 mmol/L — ABNORMAL LOW (ref 3.5–5.1)
Sodium: 131 mmol/L — ABNORMAL LOW (ref 135–145)

## 2018-12-23 LAB — MAGNESIUM: Magnesium: 1.6 mg/dL — ABNORMAL LOW (ref 1.7–2.4)

## 2018-12-23 LAB — LEGIONELLA PNEUMOPHILA SEROGP 1 UR AG: L. pneumophila Serogp 1 Ur Ag: NEGATIVE

## 2018-12-23 MED ORDER — MAGNESIUM SULFATE 2 GM/50ML IV SOLN
2.0000 g | Freq: Once | INTRAVENOUS | Status: AC
  Administered 2018-12-23: 2 g via INTRAVENOUS
  Filled 2018-12-23: qty 50

## 2018-12-23 MED ORDER — POTASSIUM CHLORIDE CRYS ER 20 MEQ PO TBCR
40.0000 meq | EXTENDED_RELEASE_TABLET | ORAL | Status: AC
  Administered 2018-12-23 (×3): 40 meq via ORAL
  Filled 2018-12-23 (×3): qty 2

## 2018-12-23 MED ORDER — SODIUM CHLORIDE 0.9 % IV SOLN
2.0000 g | INTRAVENOUS | Status: DC
  Administered 2018-12-23 – 2019-01-07 (×88): 2 g via INTRAVENOUS
  Filled 2018-12-23 (×98): qty 2000

## 2018-12-23 MED ORDER — LIDOCAINE-EPINEPHRINE 1 %-1:100000 IJ SOLN
INTRAMUSCULAR | Status: AC
  Administered 2018-12-23: 11:00:00
  Filled 2018-12-23: qty 1

## 2018-12-23 MED ORDER — NAFCILLIN SODIUM 2 G IJ SOLR
2.0000 g | INTRAMUSCULAR | Status: DC
  Filled 2018-12-23 (×2): qty 2000

## 2018-12-23 NOTE — Procedures (Signed)
Pre procedural Dx: Septic arthritis of the right L4-L5 facet Post procedural Dx: Same  Technically successful CT guided lavage aspiration of the right L4-L5 facet. All aspirated samples sent to the laboratory for analysis.    EBL: None Complications: None immediate  Ronny Bacon, MD Pager #: [phone]

## 2018-12-23 NOTE — Progress Notes (Addendum)
PROGRESS NOTE    Bradley Weber  [id number] DOB: [birth year] DOA: 12/19/2018 PCP: Patient, No Pcp Per   Brief Narrative:  Patient is a 43 year old male with history of IV drug abuse, basal carcinoma of forehead and back, who presented with severe low back pain.  Last heroin use 2 days before admission.  He was also complaining of cough, fever, chills, decreased appetite, weight loss, shortness of breath, nausea and vomiting.  Work-up in the emergency department showed lumbar epidural abscess.  Chest x-ray was concerning for pneumonia.  ID, neurosurgery were consulted.  Echocardiogram showed tricuspid valve endocarditis.  ID following for antibiotics.   Assessment & Plan:   Principal Problem:   Spinal epidural abscess Active Problems:   Community acquired pneumonia   AKI (acute kidney injury) ([place])   Transaminitis   Sepsis secondary to MSSA bacteremia: Currently on IV cefazolin.  Currently hemodynamically stable, afebrile.  Repeat cultures sent on 9/5  no growth till date.  ID following.  Epidural abscess: MRI showed  2 epidural abscesses at L4-5.Still complains of back pain.  No focal neurological deficits.  Currently afebrile, currently stable.  Has leukocytosis.  Neurosurgery was consulted who did not recommend any intervention.  I have requested IR for possible consideration of abscess drainage.  Tricuspid valve endocarditis: Echocardiogram showed vegetation on tricuspid valve.  ID following.  I have paged cardiology requesting TEE to R/O vegetation on other valves.  Community-acquired pneumonia: Chest x-ray showed left-sided pneumonia.  Continue current antibiotics.  Currently respiratory status stable.  Saturating fine on room air.  Polysubstance abuse: Injects heroin.  Counseled for cessation.  UDS positive for opiates and amphetamines.  Elevated liver enzymes/hepatitis C: Elevated liver enzymes on presentation.  Improving.Hepatitis antibody positive.Will send viral load.   AKI: Resolved  Hypokalemia: Being supplemented with potassium.  Will check magnesium level.         DVT prophylaxis: Lovenox Code Status: Full Family Communication: Discussed with the patient Disposition Plan: Not medically ready for discharge.   Consultants: ID, neurosurgery  Procedures: MRI  Antimicrobials:  Anti-infectives (From admission, onward)   Start     Dose/Rate Route Frequency Ordered Stop   12/20/18 1400  ceFAZolin (ANCEF) IVPB 2g/100 mL premix     2 g 200 mL/hr over 30 Minutes Intravenous Every 8 hours 12/20/18 0752     12/20/18 1200  azithromycin (ZITHROMAX) 500 mg in sodium chloride 0.9 % 250 mL IVPB  Status:  Discontinued     500 mg 250 mL/hr over 60 Minutes Intravenous Every 24 hours 12/19/18 1840 12/20/18 1239   12/20/18 0600  ceFAZolin (ANCEF) IVPB 1 g/50 mL premix  Status:  Discontinued     1 g 100 mL/hr over 30 Minutes Intravenous Every 8 hours 12/20/18 0318 12/20/18 0752   12/20/18 0000  vancomycin (VANCOCIN) IVPB 750 mg/150 ml premix  Status:  Discontinued     750 mg 150 mL/hr over 60 Minutes Intravenous Every 12 hours 12/19/18 1840 12/20/18 0317   12/20/18 0000  cefTRIAXone (ROCEPHIN) 2 g in sodium chloride 0.9 % 100 mL IVPB  Status:  Discontinued     2 g 200 mL/hr over 30 Minutes Intravenous Every 12 hours 12/19/18 1840 12/20/18 0734   12/19/18 1130  vancomycin (VANCOCIN) 1,500 mg in sodium chloride 0.9 % 500 mL IVPB     1,500 mg 250 mL/hr over 120 Minutes Intravenous  Once 12/19/18 1129 12/19/18 1723   12/19/18 1130  cefTRIAXone (ROCEPHIN) 2 g in sodium chloride 0.9 % 100 mL  IVPB     2 g 200 mL/hr over 30 Minutes Intravenous  Once 12/19/18 1129 12/19/18 1226   12/19/18 1130  azithromycin (ZITHROMAX) 500 mg in sodium chloride 0.9 % 250 mL IVPB     500 mg 250 mL/hr over 60 Minutes Intravenous  Once 12/19/18 1129 12/19/18 1610      Subjective:  Patient seen and examined at bedside this morning.  Hemodynamically stable, afebrile.  Complains of  worsening back pain and was inquiring about the need of abscess drainage.  Denies other complaints.   Objective: Vitals:   12/22/18 1921 12/22/18 2300 12/23/18 0300 12/23/18 0731  BP: 123/85 133/85 124/81 (!) 136/95  Pulse: 86 76 78 80  Resp: 13 20 17 14   Temp: 98.5 F (36.9 C) 98.5 F (36.9 C) 98.6 F (37 C) 98.6 F (37 C)  TempSrc: Oral Oral Oral Oral  SpO2: 97% 99% 96% 97%  Weight:      Height:        Intake/Output Summary (Last 24 hours) at 12/23/2018 0840 Last data filed at 12/23/2018 0734 Gross per 24 hour  Intake 2245 ml  Output 3300 ml  Net -1055 ml   Filed Weights   12/19/18 0518 12/19/18 2036  Weight: 79.4 kg 76.1 kg    Examination:  General exam: Appears calm and comfortable ,Not in distress,average built HEENT:PERRL,Oral mucosa moist, Ear/Nose normal on gross exam Respiratory system: Bilateral equal air entry, normal vesicular breath sounds, no wheezes or crackles  Cardiovascular system: S1 & S2 heard, RRR. No JVD, murmurs, rubs, gallops or clicks. No pedal edema. Gastrointestinal system: Abdomen is nondistended, soft and nontender. No organomegaly or masses felt. Normal bowel sounds heard. Central nervous system: Alert and oriented. No focal neurological deficits. Extremities: No edema, no clubbing ,no cyanosis, distal peripheral pulses palpable. Skin: No rashes, lesions or ulcers,no icterus ,no pallor   Data Reviewed: I have personally reviewed following labs and imaging studies  CBC: Recent Labs  Lab 12/19/18 0530 12/19/18 2143 12/20/18 0207 12/22/18 1239 12/23/18 0225  WBC 29.5* 13.9* 14.9* 8.6 9.0  NEUTROABS  --   --  12.9*  --   --   HGB 16.0 13.7 13.3 12.8* 12.4*  HCT 46.9 40.4 40.3 36.3* 35.1*  MCV 85.4 84.9 86.1 80.5 80.0  PLT 205 169 163 226 [id number]   Basic Metabolic Panel: Recent Labs  Lab 12/19/18 0530 12/19/18 2143 12/20/18 0207 12/23/18 0225  NA 137  --  134* 131*  K 3.7  --  3.9 2.9*  CL 98  --  98 95*  CO2 25  --  24 24   GLUCOSE 103*  --  128* 114*  BUN 16  --  9 8  CREATININE 1.92* 1.16 1.00 0.62  CALCIUM 9.1  --  8.5* 8.3*  MG  --  1.7  --   --    GFR: Estimated Creatinine Clearance: 126.8 mL/min (by C-G formula based on SCr of 0.62 mg/dL). Liver Function Tests: Recent Labs  Lab 12/19/18 0530 12/20/18 0207  AST 70* 46*  ALT 97* 64*  ALKPHOS 112 82  BILITOT 1.8* 1.2  PROT 7.4 6.4*  ALBUMIN 3.6 2.8*   Recent Labs  Lab 12/19/18 0530  LIPASE 20   No results for input(s): AMMONIA in the last 168 hours. Coagulation Profile: Recent Labs  Lab 12/19/18 2143  INR 1.1   Cardiac Enzymes: No results for input(s): CKTOTAL, CKMB, CKMBINDEX, TROPONINI in the last 168 hours. BNP (last 3 results) No results for  input(s): PROBNP in the last 8760 hours. HbA1C: No results for input(s): HGBA1C in the last 72 hours. CBG: No results for input(s): GLUCAP in the last 168 hours. Lipid Profile: No results for input(s): CHOL, HDL, LDLCALC, TRIG, CHOLHDL, LDLDIRECT in the last 72 hours. Thyroid Function Tests: No results for input(s): TSH, T4TOTAL, FREET4, T3FREE, THYROIDAB in the last 72 hours. Anemia Panel: No results for input(s): VITAMINB12, FOLATE, FERRITIN, TIBC, IRON, RETICCTPCT in the last 72 hours. Sepsis Labs: Recent Labs  Lab 12/19/18 0855  LATICACIDVEN 1.9    Recent Results (from the past 240 hour(s))  Blood culture (routine x 2)     Status: Abnormal   Collection Time: 12/19/18  9:00 AM   Specimen: BLOOD RIGHT ARM  Result Value Ref Range Status   Specimen Description BLOOD RIGHT ARM  Final   Special Requests   Final    BOTTLES DRAWN AEROBIC AND ANAEROBIC Blood Culture adequate volume   Culture  Setup Time   Final    IN BOTH AEROBIC AND ANAEROBIC BOTTLES GRAM POSITIVE COCCI CRITICAL VALUE NOTED.  VALUE IS CONSISTENT WITH PREVIOUSLY REPORTED AND CALLED VALUE.    Culture (A)  Final    STAPHYLOCOCCUS AUREUS SUSCEPTIBILITIES PERFORMED ON PREVIOUS CULTURE WITHIN THE LAST 5 DAYS.  Performed at [facility], [place] [address], [place], [place] [address]    Report Status 12/22/2018 FINAL  Final  Blood culture (routine x 2)     Status: Abnormal   Collection Time: 12/19/18  9:01 AM   Specimen: BLOOD  Result Value Ref Range Status   Specimen Description BLOOD SITE NOT SPECIFIED  Final   Special Requests   Final    BOTTLES DRAWN AEROBIC AND ANAEROBIC Blood Culture results may not be optimal due to an inadequate volume of blood received in culture bottles   Culture  Setup Time   Final    IN BOTH AEROBIC AND ANAEROBIC BOTTLES GRAM POSITIVE COCCI Organism ID to follow CRITICAL RESULT CALLED TO, READ BACK BY AND VERIFIED WITH: PHRMD L SEAY  @0310  12/20/18 BY S GEZAHEGN Performed at [facility], [place] [address], [place], [place] [address]    Culture STAPHYLOCOCCUS AUREUS (A)  Final   Report Status 12/22/2018 FINAL  Final   Organism ID, Bacteria STAPHYLOCOCCUS AUREUS  Final      Susceptibility   Staphylococcus aureus - MIC*    CIPROFLOXACIN <=0.5 SENSITIVE Sensitive     ERYTHROMYCIN <=0.25 SENSITIVE Sensitive     GENTAMICIN <=0.5 SENSITIVE Sensitive     OXACILLIN 0.5 SENSITIVE Sensitive     TETRACYCLINE >=16 RESISTANT Resistant     VANCOMYCIN 1 SENSITIVE Sensitive     TRIMETH/SULFA <=10 SENSITIVE Sensitive     CLINDAMYCIN <=0.25 SENSITIVE Sensitive     RIFAMPIN <=0.5 SENSITIVE Sensitive     Inducible Clindamycin NEGATIVE Sensitive     * STAPHYLOCOCCUS AUREUS  Blood Culture ID Panel (Reflexed)     Status: Abnormal   Collection Time: 12/19/18  9:01 AM  Result Value Ref Range Status   Enterococcus species NOT DETECTED NOT DETECTED Final   Listeria monocytogenes NOT DETECTED NOT DETECTED Final   Staphylococcus species DETECTED (A) NOT DETECTED Final    Comment: CRITICAL RESULT CALLED TO, READ BACK BY AND VERIFIED WITH: PHRMD L SEAY  @0310  12/20/18 BY S GEZAHEGN    Staphylococcus aureus (BCID) DETECTED (A) NOT DETECTED Final    Comment: Methicillin (oxacillin)  susceptible Staphylococcus aureus (MSSA). Preferred therapy is anti staphylococcal beta lactam antibiotic (  Cefazolin or Nafcillin), unless clinically contraindicated. CRITICAL RESULT CALLED TO, READ BACK BY AND VERIFIED WITH: PHRMD L SEAY  @0310  12/20/18 BY S GEZAHEGN    Methicillin resistance NOT DETECTED NOT DETECTED Final   Streptococcus species NOT DETECTED NOT DETECTED Final   Streptococcus agalactiae NOT DETECTED NOT DETECTED Final   Streptococcus pneumoniae NOT DETECTED NOT DETECTED Final   Streptococcus pyogenes NOT DETECTED NOT DETECTED Final   Acinetobacter baumannii NOT DETECTED NOT DETECTED Final   Enterobacteriaceae species NOT DETECTED NOT DETECTED Final   Enterobacter cloacae complex NOT DETECTED NOT DETECTED Final   Escherichia coli NOT DETECTED NOT DETECTED Final   Klebsiella oxytoca NOT DETECTED NOT DETECTED Final   Klebsiella pneumoniae NOT DETECTED NOT DETECTED Final   Proteus species NOT DETECTED NOT DETECTED Final   Serratia marcescens NOT DETECTED NOT DETECTED Final   Haemophilus influenzae NOT DETECTED NOT DETECTED Final   Neisseria meningitidis NOT DETECTED NOT DETECTED Final   Pseudomonas aeruginosa NOT DETECTED NOT DETECTED Final   Candida albicans NOT DETECTED NOT DETECTED Final   Candida glabrata NOT DETECTED NOT DETECTED Final   Candida krusei NOT DETECTED NOT DETECTED Final   Candida parapsilosis NOT DETECTED NOT DETECTED Final   Candida tropicalis NOT DETECTED NOT DETECTED Final    Comment: Performed at [facility], [place] [address] [place], [place] [address]  SARS Coronavirus 2 [facility] order, Performed in [facility] hospital lab) Nasopharyngeal Nasopharyngeal Swab     Status: None   Collection Time: 12/19/18 11:04 AM   Specimen: Nasopharyngeal Swab  Result Value Ref Range Status   SARS Coronavirus 2 NEGATIVE NEGATIVE Final    Comment: (NOTE) If result is NEGATIVE SARS-CoV-2 target nucleic acids are NOT DETECTED. The SARS-CoV-2 RNA is generally  detectable in upper and lower  respiratory specimens during the acute phase of infection. The lowest  concentration of SARS-CoV-2 viral copies this assay can detect is 250  copies / mL. A negative result does not preclude SARS-CoV-2 infection  and should not be used as the sole basis for treatment or other  patient management decisions.  A negative result may occur with  improper specimen collection / handling, submission of specimen other  than nasopharyngeal swab, presence of viral mutation(s) within the  areas targeted by this assay, and inadequate number of viral copies  (<250 copies / mL). A negative result must be combined with clinical  observations, patient history, and epidemiological information. If result is POSITIVE SARS-CoV-2 target nucleic acids are DETECTED. The SARS-CoV-2 RNA is generally detectable in upper and lower  respiratory specimens dur ing the acute phase of infection.  Positive  results are indicative of active infection with SARS-CoV-2.  Clinical  correlation with patient history and other diagnostic information is  necessary to determine patient infection status.  Positive results do  not rule out bacterial infection or co-infection with other viruses. If result is PRESUMPTIVE POSTIVE SARS-CoV-2 nucleic acids MAY BE PRESENT.   A presumptive positive result was obtained on the submitted specimen  and confirmed on repeat testing.  While 2019 novel coronavirus  (SARS-CoV-2) nucleic acids may be present in the submitted sample  additional confirmatory testing may be necessary for epidemiological  and / or clinical management purposes  to differentiate between  SARS-CoV-2 and other Sarbecovirus currently known to infect humans.  If clinically indicated additional testing with an alternate test  methodology [phone]) is advised. The SARS-CoV-2 RNA is generally  detectable in upper and lower respiratory sp ecimens during the  acute  phase of infection. The  expected result is Negative. Fact Sheet for Patients:  StrictlyIdeas.no Fact Sheet for Healthcare Providers: BankingDealers.co.za This test is not yet approved or cleared by the [place] FDA and has been authorized for detection and/or diagnosis of SARS-CoV-2 by FDA under an Emergency Use Authorization (EUA).  This EUA will remain in effect (meaning this test can be used) for the duration of the COVID-19 declaration under Section 564(b)(1) of the Act, 21 U.S.C. section 360bbb-3(b)(1), unless the authorization is terminated or revoked sooner. Performed at [facility], [place] [address] [place], [place] [address]   MRSA PCR Screening     Status: None   Collection Time: 12/19/18  8:57 PM   Specimen: Nasal Mucosa; Nasopharyngeal  Result Value Ref Range Status   MRSA by PCR NEGATIVE NEGATIVE Final    Comment:        The GeneXpert MRSA Assay (FDA approved for NASAL specimens only), is one component of a comprehensive MRSA colonization surveillance program. It is not intended to diagnose MRSA infection nor to guide or monitor treatment for MRSA infections. Performed at [facility], [place] [address] [place], [place] [address]   Culture, blood (routine x 2)     Status: None (Preliminary result)   Collection Time: 12/21/18  5:39 AM   Specimen: BLOOD  Result Value Ref Range Status   Specimen Description BLOOD LEFT ANTECUBITAL  Final   Special Requests   Final    BOTTLES DRAWN AEROBIC ONLY Blood Culture adequate volume   Culture   Final    NO GROWTH 2 DAYS Performed at [facility], [place] [address], [place], [place] [address]    Report Status PENDING  Incomplete  Culture, blood (routine x 2)     Status: None (Preliminary result)   Collection Time: 12/21/18  5:45 AM   Specimen: BLOOD RIGHT HAND  Result Value Ref Range Status   Specimen Description BLOOD RIGHT HAND  Final   Special Requests   Final    BOTTLES DRAWN  AEROBIC ONLY Blood Culture adequate volume   Culture   Final    NO GROWTH 2 DAYS Performed at [facility], [place] [address] [place], [place] [address]    Report Status PENDING  Incomplete         Radiology Studies: No results found.      Scheduled Meds: . enoxaparin (LOVENOX) injection  40 mg Subcutaneous Q24H  . pantoprazole  40 mg Oral Daily  . potassium chloride  40 mEq Oral Q3H  . sodium chloride flush  3 mL Intravenous Q12H   Continuous Infusions: . sodium chloride 75 mL/hr at 12/22/18 2124  . sodium chloride    .  ceFAZolin (ANCEF) IV 2 g (12/23/18 0500)     LOS: 4 days    Time spent: 35 mins.More than 50% of that time was spent in counseling and/or coordination of care.      Shelly Coss, MD [facility] Pager [phone]  If 7PM-7AM, please contact night-coverage [email or web address] Password [email or web address] 12/23/2018, 8:40 AM

## 2018-12-23 NOTE — Consult Note (Signed)
Chief Complaint: Patient was seen in consultation today for epidural abscess aspiration Chief Complaint  Patient presents with   Back Pain   at the request of Dr Lebron Conners  Supervising Physician: Sandi Mariscal  Patient Status: [facility] - In-pt  History of Present Illness: Bradley Weber is a 43 y.o. male   IV drug abuse, basal carcinoma of forehead and back, who presented with severe low back pain.  Last heroin use 2 days before admission.  He was also complaining of cough, fever, chills, decreased appetite, weight loss, shortness of breath, nausea and vomiting.  Work-up in the emergency department showed lumbar epidural abscess.   IMPRESSION: The examination is positive for a septic facet effusion on the right at L4-5 resulting in 2 epidural abscesses. The larger collection emanates from the medial margin of the right L4-5 facet and results in mass effect on the thecal sac which is deflected to the left and narrowed. A second smaller abscess is seen superiorly at the level of the L4 pedicles which causes minimal mass effect on the thecal sac. A small abscess extending posteriorly out of the L4-5 facet is also noted. Extensive inflammatory change in posterior paraspinous musculature is worse on the right in the multi tip at this and consistent with myositis. No intramuscular abscess is identified.  Has started on Ancef per ID  Request for aspiration for conformation of infection/treatment Imaging has been reviewed with Dr Vira Blanco procedure    Past Medical History:  Diagnosis Date   ADD (attention deficit disorder)    Basal cell carcinoma    Chronic headaches    Kidney stone    MRSA (methicillin resistant Staphylococcus aureus)     Past Surgical History:  Procedure Laterality Date   MOUTH SURGERY     MRSA cyst excision     chest   SKIN CANCER EXCISION      Allergies: Patient has no known allergies.  Medications: Prior to Admission medications     Medication Sig Start Date End Date Taking? Authorizing Provider  ibuprofen (ADVIL) 200 MG tablet Take 200 mg by mouth every 6 (six) hours as needed for moderate pain.   Yes [provider]     Family History  Problem Relation Age of Onset   Heart attack Father    Pancreatic cancer Maternal Grandmother    Diabetes Maternal Grandmother     Social History   Socioeconomic History   Marital status: Married    Spouse name: Not on file   Number of children: 2   Years of education: Not on file   Highest education level: Not on file  Occupational History    Employer: [employer]  Social Needs   Financial resource strain: Not on file   Food insecurity    Worry: Not on file    Inability: Not on file   Transportation needs    Medical: Not on file    Non-medical: Not on file  Tobacco Use   Smoking status: Current Every Day Smoker   Smokeless tobacco: Never Used  Substance and Sexual Activity   Alcohol use: No    Comment: occasionally   Drug use: Yes    Types: Oxycodone   Sexual activity: Not on file  Lifestyle   Physical activity    Days per week: Not on file    Minutes per session: Not on file   Stress: Not on file  Relationships   Social connections    Talks on phone: Not  on file    Gets together: Not on file    Attends religious service: Not on file    Active member of club or organization: Not on file    Attends meetings of clubs or organizations: Not on file    Relationship status: Not on file  Other Topics Concern   Not on file  Social History Narrative   Not on file    Review of Systems: A 12 point ROS discussed and pertinent positives are indicated in the HPI above.  All other systems are negative.  Review of Systems  Constitutional: Positive for activity change and fatigue. Negative for fever.  Respiratory: Negative for cough and shortness of breath.   Cardiovascular: Negative for chest pain.  Gastrointestinal: Negative for  abdominal pain.  Musculoskeletal: Positive for back pain and gait problem.  Neurological: Positive for weakness.  Psychiatric/Behavioral: Negative for behavioral problems and confusion.    Vital Signs: BP (!) 136/95 (BP Location: Left Arm)    Pulse 80    Temp 98.6 F (37 C) (Oral)    Resp 14    Ht 5\' 11"  (1.803 m)    Wt 167 lb 12.3 oz (76.1 kg)    SpO2 97%    BMI 23.40 kg/m   Physical Exam Vitals signs reviewed.  Cardiovascular:     Rate and Rhythm: Normal rate and regular rhythm.     Heart sounds: Normal heart sounds.  Pulmonary:     Breath sounds: Normal breath sounds.  Abdominal:     Palpations: Abdomen is soft.  Musculoskeletal: Normal range of motion.     Comments: Low back pain  Skin:    General: Skin is warm and dry.  Neurological:     Mental Status: He is alert and oriented to person, place, and time.  Psychiatric:        Mood and Affect: Mood normal.        Behavior: Behavior normal.        Thought Content: Thought content normal.        Judgment: Judgment normal.     Imaging: Mr Thoracic Spine W Wo Contrast  Result Date: 12/19/2018 CLINICAL DATA:  Severe thoracic and lumbar spine pain for 2 days. Possible abnormality at L4-5 on prior CT abdomen and pelvis. EXAM: MRI THORACIC AND LUMBAR SPINE WITHOUT AND WITH CONTRAST TECHNIQUE: Multiplanar and multiecho pulse sequences of the thoracic and lumbar spine were obtained without and with intravenous contrast. CONTRAST:  8 mL Gadavist IV. COMPARISON:  CT abdomen and pelvis 12/19/2018. FINDINGS: MRI THORACIC SPINE FINDINGS Alignment:  Normal. Vertebrae: No fracture, evidence of discitis, or bone lesion. Cord:  Normal signal and morphology. Paraspinal and other soft tissues: Extensive airspace disease in the left chest is identified. There appears to be patchy airspace disease on the right. Disc levels: The central canal and foramina are widely patent at all levels. No disc bulge or protrusion. MRI LUMBAR SPINE FINDINGS  Segmentation:  Standard. Alignment:  Maintained. Vertebrae: No fracture or evidence of discitis or osteomyelitis. However, there is edema and enhancement and posterior paraspinous musculature bilaterally but much worse on the right in the multifidus. Inflammatory change is centered about the right L4-5 facet joint. An abscess extending out of the medial margin of the L4-5 facets into the epidural space measures 0.5 cm transverse by 0.9 cm AP by approximately 2.4 cm craniocaudal. The abscess results in mass effect on the thecal sac which is deflected to the left and deformed. A second smaller abscess  is seen subjacent to the right L4 pedicle and lamina measuring 0.8 cm AP x 0.3 cm transverse by approximately 0.8 cm craniocaudal. There is only mild mass effect on the right side of the thecal sac secondary to this collection. A small rim enhancing fluid collection emanates posteriorly out of the right L4-5 facet measures 0.8 cm transverse by 0.9 cm AP by 1.5 cm craniocaudal. Conus medullaris: Extends to the L1 level and appears normal. Paraspinal and other soft tissues: As described above. Disc levels: T12-L1 to L3-4 are negative. L4-5: Shallow disc bulge and moderate facet arthropathy. L5-S1: Negative. IMPRESSION: The examination is positive for a septic facet effusion on the right at L4-5 resulting in 2 epidural abscesses. The larger collection emanates from the medial margin of the right L4-5 facet and results in mass effect on the thecal sac which is deflected to the left and narrowed. A second smaller abscess is seen superiorly at the level of the L4 pedicles which causes minimal mass effect on the thecal sac. A small abscess extending posteriorly out of the L4-5 facet is also noted. Extensive inflammatory change in posterior paraspinous musculature is worse on the right in the multi tip at this and consistent with myositis. No intramuscular abscess is identified. Normal appearing thoracic spine. Left worse than  right airspace disease consistent with pneumonia. These results were called by telephone at the time of interpretation on 12/19/2018 at 3:26 pm to [facility] who verbally acknowledged these results. Electronically Signed   By: Inge Rise M.D.   On: 12/19/2018 15:28   Mr Lumbar Spine W Wo Contrast  Result Date: 12/19/2018 CLINICAL DATA:  Severe thoracic and lumbar spine pain for 2 days. Possible abnormality at L4-5 on prior CT abdomen and pelvis. EXAM: MRI THORACIC AND LUMBAR SPINE WITHOUT AND WITH CONTRAST TECHNIQUE: Multiplanar and multiecho pulse sequences of the thoracic and lumbar spine were obtained without and with intravenous contrast. CONTRAST:  8 mL Gadavist IV. COMPARISON:  CT abdomen and pelvis 12/19/2018. FINDINGS: MRI THORACIC SPINE FINDINGS Alignment:  Normal. Vertebrae: No fracture, evidence of discitis, or bone lesion. Cord:  Normal signal and morphology. Paraspinal and other soft tissues: Extensive airspace disease in the left chest is identified. There appears to be patchy airspace disease on the right. Disc levels: The central canal and foramina are widely patent at all levels. No disc bulge or protrusion. MRI LUMBAR SPINE FINDINGS Segmentation:  Standard. Alignment:  Maintained. Vertebrae: No fracture or evidence of discitis or osteomyelitis. However, there is edema and enhancement and posterior paraspinous musculature bilaterally but much worse on the right in the multifidus. Inflammatory change is centered about the right L4-5 facet joint. An abscess extending out of the medial margin of the L4-5 facets into the epidural space measures 0.5 cm transverse by 0.9 cm AP by approximately 2.4 cm craniocaudal. The abscess results in mass effect on the thecal sac which is deflected to the left and deformed. A second smaller abscess is seen subjacent to the right L4 pedicle and lamina measuring 0.8 cm AP x 0.3 cm transverse by approximately 0.8 cm craniocaudal. There is only mild mass  effect on the right side of the thecal sac secondary to this collection. A small rim enhancing fluid collection emanates posteriorly out of the right L4-5 facet measures 0.8 cm transverse by 0.9 cm AP by 1.5 cm craniocaudal. Conus medullaris: Extends to the L1 level and appears normal. Paraspinal and other soft tissues: As described above. Disc levels: T12-L1 to L3-4  are negative. L4-5: Shallow disc bulge and moderate facet arthropathy. L5-S1: Negative. IMPRESSION: The examination is positive for a septic facet effusion on the right at L4-5 resulting in 2 epidural abscesses. The larger collection emanates from the medial margin of the right L4-5 facet and results in mass effect on the thecal sac which is deflected to the left and narrowed. A second smaller abscess is seen superiorly at the level of the L4 pedicles which causes minimal mass effect on the thecal sac. A small abscess extending posteriorly out of the L4-5 facet is also noted. Extensive inflammatory change in posterior paraspinous musculature is worse on the right in the multi tip at this and consistent with myositis. No intramuscular abscess is identified. Normal appearing thoracic spine. Left worse than right airspace disease consistent with pneumonia. These results were called by telephone at the time of interpretation on 12/19/2018 at 3:26 pm to [facility] who verbally acknowledged these results. Electronically Signed   By: Inge Rise M.D.   On: 12/19/2018 15:28   Ct Abdomen Pelvis W Contrast  Result Date: 12/19/2018 CLINICAL DATA:  43 year old male with hematemesis and back pain. EXAM: CT ABDOMEN AND PELVIS WITH CONTRAST TECHNIQUE: Multidetector CT imaging of the abdomen and pelvis was performed using the standard protocol following bolus administration of intravenous contrast. CONTRAST:  49mL ISOVUE-300 IOPAMIDOL (ISOVUE-300) INJECTION 61% COMPARISON:  Lumbar spine CT dated 12/19/2018 . FINDINGS: Lower chest: Confluent airspace  density in the left lower lobe and lingula and to a lesser degree in the right middle lobe most consistent with pneumonia. Clinical correlation is recommended. No intra-abdominal free air or free fluid. Hepatobiliary: Probable mild fatty infiltration of the liver. No intrahepatic biliary ductal dilatation. The gallbladder is unremarkable. Pancreas: Unremarkable. No pancreatic ductal dilatation or surrounding inflammatory changes. Spleen: Normal in size without focal abnormality. Adrenals/Urinary Tract: The adrenal glands are unremarkable. The kidneys, visualized ureters, and urinary bladder appear unremarkable as well. Stomach/Bowel: There is moderate stool throughout the colon. There is no bowel obstruction or active inflammation. The appendix is normal. Vascular/Lymphatic: Mild aortoiliac atherosclerotic disease. The IVC is unremarkable. No portal venous gas. There is no adenopathy. Reproductive: The prostate and seminal vesicles are grossly unremarkable. No pelvic mass. Other: None Musculoskeletal: No acute osseous pathology. Apparent mild haziness of the fat plane at L4-L5 neural foramina and posterior right psoas muscle. This can be. This may be artifactual. If there is clinical concern for an infectious or inflammatory process, MRI may provide better evaluation. No drainable fluid collection. IMPRESSION: 1. Multifocal pneumonia.  Clinical correlation is recommended. 2. No acute intra-abdominal or pelvic pathology. No bowel obstruction or active inflammation. Normal appendix. 3. Artifact versus mild stranding of the fat at L4-L5. Clinical correlation is recommended. 4. Minimal aortic Atherosclerosis (ICD10-I70.0). Electronically Signed   By: Anner Crete M.D.   On: 12/19/2018 10:34   Ct L-spine No Charge  Result Date: 12/19/2018 CLINICAL DATA:  43 year old male with hematemesis and back pain. EXAM: CT LUMBAR SPINE WITHOUT CONTRAST TECHNIQUE: Multidetector CT imaging of the lumbar spine was performed  without intravenous contrast administration. Multiplanar CT image reconstructions were also generated. COMPARISON:  CT of the abdomen pelvis dated 9320 FINDINGS: Segmentation: 5 lumbar type vertebrae. Alignment: Normal. Vertebrae: No acute fracture or focal pathologic process. Paraspinal and other soft tissues: No fluid collection. There is apparent mild haziness of the fat plane primarily at L4-L5 involving the neural foramina and posterior aspect of the right psoas muscle. If there is clinical concern for  an infectious or inflammatory process, MRI may provide better evaluation. Disc levels: No degenerative changes. The neural foramina are patent. IMPRESSION: 1. No acute/traumatic lumbar spine pathology. 2. Apparent mild haziness of the fat plane primarily at L4-L5 involving the neural foramina and posterior aspect of the right psoas muscle. This may be artifactual. If there is clinical concern for an infectious or inflammatory process, MRI may provide better evaluation. Electronically Signed   By: Anner Crete M.D.   On: 12/19/2018 10:34   Dg Chest Port 1 View  Result Date: 12/19/2018 CLINICAL DATA:  Pneumonia. EXAM: PORTABLE CHEST 1 VIEW COMPARISON:  None. FINDINGS: The heart size and mediastinal contours are within normal limits. No pneumothorax or pleural effusion is noted. Right lung is clear. Left midlung and basilar opacity is noted consistent with pneumonia. The visualized skeletal structures are unremarkable. IMPRESSION: Large left-sided pneumonia. Followup PA and lateral chest X-ray is recommended in 3-4 weeks following trial of antibiotic therapy to ensure resolution and exclude underlying malignancy. Electronically Signed   By: Marijo Conception M.D.   On: 12/19/2018 12:15    Labs:  CBC: Recent Labs    12/19/18 2143 12/20/18 0207 12/22/18 1239 12/23/18 0225  WBC 13.9* 14.9* 8.6 9.0  HGB 13.7 13.3 12.8* 12.4*  HCT 40.4 40.3 36.3* 35.1*  PLT 169 163 226 237    COAGS: Recent Labs     12/19/18 2143  INR 1.1    BMP: Recent Labs    12/19/18 0530 12/19/18 2143 12/20/18 0207 12/23/18 0225  NA 137  --  134* 131*  K 3.7  --  3.9 2.9*  CL 98  --  98 95*  CO2 25  --  24 24  GLUCOSE 103*  --  128* 114*  BUN 16  --  9 8  CALCIUM 9.1  --  8.5* 8.3*  CREATININE 1.92* 1.16 1.00 0.62  GFRNONAA 42* >60 >60 >60  GFRAA 48* >60 >60 >60    LIVER FUNCTION TESTS: Recent Labs    12/19/18 0530 12/20/18 0207  BILITOT 1.8* 1.2  AST 70* 46*  ALT 97* 64*  ALKPHOS 112 82  PROT 7.4 6.4*  ALBUMIN 3.6 2.8*    TUMOR MARKERS: No results for input(s): AFPTM, CEA, CA199, CHROMGRNA in the last 8760 hours.  Assessment and Plan:  Low back pain-- worsening Admitted for same Epidural abscess noted on imaging Scheduled now for Epidural abscess aspiration Risks and benefits of epidural abscess apiration was discussed with the patient and/or patient's family including, but not limited to bleeding, infection, damage to adjacent structures or low yield requiring additional tests.  All of the questions were answered and there is agreement to proceed. Consent signed and in chart.   Thank you for this interesting consult.  I greatly enjoyed meeting Bradley Weber and look forward to participating in their care.  A copy of this report was sent to the requesting provider on this date.  Electronically Signed: Lavonia Drafts, PA-C 12/23/2018, 9:42 AM   I spent a total of 20 Minutes    in face to face in clinical consultation, greater than 50% of which was counseling/coordinating care for epidural abscess aspiration

## 2018-12-23 NOTE — Progress Notes (Signed)
[facility] for Infectious Disease    Date of Admission:  12/19/2018   Total days of antibiotics 5/4 cefazolin          ID: Bradley Weber is a 43 y.o. male with mssa bacteremia, Lumbar epidural abscess, , and TV endocarditis Principal Problem:   Spinal epidural abscess Active Problems:   Community acquired pneumonia   AKI (acute kidney injury) ([place])   Transaminitis    Subjective: Afebrile. Underwent L4-L5 aspirate. He reports mild discomfort from procedure. He had TTE over the weekend and found to have moderate large TV vegetation. TEE still recommended per cardiology  ROS = 12 point ros is negative  Medications:   enoxaparin (LOVENOX) injection  40 mg Subcutaneous Q24H   pantoprazole  40 mg Oral Daily   potassium chloride  40 mEq Oral Q3H   sodium chloride flush  3 mL Intravenous Q12H    Objective: Vital signs in last 24 hours: Temp:  [98.1 F (36.7 C)-98.7 F (37.1 C)] 98.1 F (36.7 C) (09/07 1122) Pulse Rate:  [76-86] 77 (09/07 1122) Resp:  [11-20] 11 (09/07 1122) BP: (123-136)/(78-95) 126/78 (09/07 1122) SpO2:  [96 %-100 %] 98 % (09/07 1122)  Physical Exam  Constitutional: He is oriented to person, place, and time. He appears well-developed and well-nourished. No distress.  HENT:  Mouth/Throat: Oropharynx is clear and moist. No oropharyngeal exudate.  Cardiovascular: Normal rate, regular rhythm and normal heart sounds. Exam reveals no gallop and no friction rub.  No murmur heard.  Pulmonary/Chest: Effort normal and breath sounds normal. No respiratory distress. He has no wheezes.  Abdominal: Soft. Bowel sounds are normal. He exhibits no distension. There is no tenderness.  Lymphadenopathy:  He has no cervical adenopathy.  Neurological: He is alert and oriented to person, place, and time. 5/5 motor in BUE and BLE and sensation intact throughout Skin: Skin is warm and dry. No rash noted. No erythema.  Psychiatric: He has a normal mood and affect. His  behavior is normal.     Lab Results Recent Labs    12/22/18 1239 12/23/18 0225  WBC 8.6 9.0  HGB 12.8* 12.4*  HCT 36.3* 35.1*  NA  --  131*  K  --  2.9*  CL  --  95*  CO2  --  24  BUN  --  8  CREATININE  --  0.62   Erythrocyte Sedimentation Rate     Component Value Date/Time   ESRSEDRATE 12 12/19/2018 0530    Microbiology: 9/7 facet aspirate pending 9/5 blood cx ngtd at 48hr 9/3 blood cx MSSA  Studies/Results: Ct Image Guided Drainage By Percutaneous Catheter  Result Date: 12/23/2018 INDICATION: Concern for septic arthritis involving the right-sided L4-L5 facet. Please perform image guided aspiration for diagnostic purposes. EXAM: CT-GUIDED ASPIRATION OF THE RIGHT L4-L5 FACET COMPARISON:  Lumbar spine CT-12/19/2018; lumbar spine MRI-12/19/2018 MEDICATIONS: The patient is currently admitted to the hospital and receiving intravenous antibiotics. The antibiotics were administered within an appropriate time frame prior to the initiation of the procedure. ANESTHESIA/SEDATION: None CONTRAST:  None COMPLICATIONS: None immediate. PROCEDURE: Informed written consent was obtained from the patient after a discussion of the risks, benefits and alternatives to treatment. The patient was placed prone on the CT gantry and a pre procedural CT was performed identifying the right L4-L5 facet at the location of questioned adjacent abscess on preceding lumbar spine MRI. The procedure was planned. A timeout was performed prior to the initiation of the procedure. The skin overlying  the lower back was prepped and draped in the usual sterile fashion. The overlying soft tissues were anesthetized with 1% lidocaine with epinephrine. Appropriate trajectory was planned with the use of a 22 gauge spinal needle. An 18 gauge trocar needle was advanced adjacent to and about the caudal aspect of the right L4-L5 facet. Despite appropriate needle positioning, no fluid was able to be aspirated at this location. As such,  approximately 3 cc of saline was instilled and subsequently aspirated. The needle was removed intact. All aspirated fluid was capped and sent to the laboratory for analysis. The patient tolerated the procedure well without immediate post procedural complication. IMPRESSION: Technically successful CT guided lavage aspiration of the right L4-L5 facet. Electronically Signed   By: Sandi Mariscal M.D.   On: 12/23/2018 11:54     Assessment/Plan: MSSA TV endocarditis = moderately large vegetation. Could possibly explain chest xray findings with infiltrates. Given burden of disease and being younger without kidney injury. Will have him swtiched to nafcillin. Recommend TEE to evaluate other valves.  MSSA epidural abscess = will be on nafcillin for minimum of 6 wk. Will check sed rate and crp. Continue with neuro exams. At this time does not appear to need decompression. Recommend to repeat imaging at end of 6-8wk to ensure improvement  Will have follow up in 6 wk in ID clinic  [facility] Cell: [phone] Pager: [phone]  12/23/2018, 12:56 PM

## 2018-12-24 DIAGNOSIS — B9561 Methicillin susceptible Staphylococcus aureus infection as the cause of diseases classified elsewhere: Secondary | ICD-10-CM

## 2018-12-24 DIAGNOSIS — I330 Acute and subacute infective endocarditis: Secondary | ICD-10-CM

## 2018-12-24 DIAGNOSIS — F1590 Other stimulant use, unspecified, uncomplicated: Secondary | ICD-10-CM

## 2018-12-24 DIAGNOSIS — F1190 Opioid use, unspecified, uncomplicated: Secondary | ICD-10-CM

## 2018-12-24 DIAGNOSIS — I071 Rheumatic tricuspid insufficiency: Secondary | ICD-10-CM

## 2018-12-24 LAB — BASIC METABOLIC PANEL
Anion gap: 11 (ref 5–15)
BUN: 7 mg/dL (ref 6–20)
CO2: 25 mmol/L (ref 22–32)
Calcium: 8.5 mg/dL — ABNORMAL LOW (ref 8.9–10.3)
Chloride: 98 mmol/L (ref 98–111)
Creatinine, Ser: 0.65 mg/dL (ref 0.61–1.24)
GFR calc Af Amer: >60 mL/min (ref >60)
GFR calc non Af Amer: >60 mL/min (ref >60)
Glucose, Bld: 92 mg/dL (ref 70–99)
Potassium: 3.4 mmol/L — ABNORMAL LOW (ref 3.5–5.1)
Sodium: 134 mmol/L — ABNORMAL LOW (ref 135–145)

## 2018-12-24 MED ORDER — MAGNESIUM SULFATE 2 GM/50ML IV SOLN
2.0000 g | Freq: Once | INTRAVENOUS | Status: AC
  Administered 2018-12-24: 06:00:00 2 g via INTRAVENOUS
  Filled 2018-12-24: qty 50

## 2018-12-24 MED ORDER — SENNOSIDES-DOCUSATE SODIUM 8.6-50 MG PO TABS
2.0000 | ORAL_TABLET | Freq: Two times a day (BID) | ORAL | Status: DC
  Administered 2018-12-24 – 2019-01-07 (×13): 2 via ORAL
  Filled 2018-12-24 (×22): qty 2

## 2018-12-24 MED ORDER — POLYETHYLENE GLYCOL 3350 17 G PO PACK
17.0000 g | PACK | Freq: Every day | ORAL | Status: DC
  Administered 2018-12-24 – 2019-01-02 (×2): 17 g via ORAL
  Filled 2018-12-24 (×9): qty 1

## 2018-12-24 MED ORDER — POTASSIUM CHLORIDE CRYS ER 20 MEQ PO TBCR
40.0000 meq | EXTENDED_RELEASE_TABLET | Freq: Once | ORAL | Status: AC
  Administered 2018-12-24: 40 meq via ORAL
  Filled 2018-12-24: qty 2

## 2018-12-24 NOTE — Progress Notes (Signed)
[facility]   Reason for visit: Follow up on MSSA TVIE/lumbar abscess  Antibiotics: day 6 Nafcillin, day 2 + cefazolin for 4 days  Interval History: TEE planned for today. IR drained L4-L5 epidural abscess yesterday. No ambulation thus far today. Pt's mother at bedside today. Last BM was 2 days ago. Fever curve, WBC trends, cx results, imaging, and ABX usage all independently reviewed    Current Facility-Administered Medications:    0.9 %  sodium chloride infusion, 250 mL, Intravenous, PRN, Pahwani, Rinka R, MD   acetaminophen (TYLENOL) tablet 650 mg, 650 mg, Oral, Q6H PRN, Pahwani, Rinka R, MD, 650 mg at 12/21/18 1706   enoxaparin (LOVENOX) injection 40 mg, 40 mg, Subcutaneous, Q24H, Pahwani, Rinka R, MD, 40 mg at 12/23/18 2100   morphine 2 MG/ML injection 1 mg, 1 mg, Intravenous, Q6H PRN, Guilford Shi, MD, 1 mg at 12/24/18 0716   nafcillin 2 g in sodium chloride 0.9 % 100 mL IVPB, 2 g, Intravenous, Q4H, Carlyle Basques, MD, Last Rate: 200 mL/hr at 12/24/18 1034, 2 g at 12/24/18 1034   ondansetron (ZOFRAN) injection 4 mg, 4 mg, Intravenous, Q6H PRN, Pahwani, Rinka R, MD, 4 mg at 12/21/18 1714   pantoprazole (PROTONIX) EC tablet 40 mg, 40 mg, Oral, Daily, Ronna Polio, RPH, 40 mg at 12/24/18 1032   polyethylene glycol (MIRALAX / GLYCOLAX) packet 17 g, 17 g, Oral, Daily, Hall, Carole N, DO   senna-docusate (Senokot-S) tablet 2 tablet, 2 tablet, Oral, BID, Hall, Carole N, DO   sodium chloride flush (NS) 0.9 % injection 3 mL, 3 mL, Intravenous, Q12H, Pahwani, Rinka R, MD, 3 mL at 12/24/18 1218   sodium chloride flush (NS) 0.9 % injection 3 mL, 3 mL, Intravenous, PRN, Pahwani, Rinka R, MD   traMADol (ULTRAM) tablet 50 mg, 50 mg, Oral, Q6H PRN, Guilford Shi, MD, 50 mg at 12/24/18 1216   Physical Exam:   Vitals:   12/24/18 0744 12/24/18 1130  BP: 125/78 109/74  Pulse: 85 70  Resp: 13 11  Temp: 98.7 F (37.1 C) 98.7 F (37.1 C)    SpO2: 97% 97%   Physical Exam Gen: disheveled appearance, remains evasive with several questions, anxious, mild distress secondary to low back pain, A&Ox 3 Head: NCAT, mild bitemporal wasting evident EENT: PERRL, EOMI, MMM, adequate dentition Neck: supple, no JVD CV: NRRR, I/VI SEM at LLSB Pulm: CTA bilaterally, mild expiratory wheeze, rare retractions on BNC Abd: soft, NTND, +BS (hypoactive) Extrems:  1+ non-pitting LE edema, 1+ pulses Skin: no rashes,occasional scratches to both LEs, +abrasion to LT forehead, BCC lesion above bridge of nose w/o drainage or erythema, adequate skin turgor Neuro: CN II-XII grossly intact, no focal neurologic deficits appreciated, gait was not assessed, A&Ox 3   Review of Systems:  Review of Systems  Constitutional: Positive for chills and fever. Negative for weight loss.  HENT: Negative for congestion, hearing loss, sinus pain and sore throat.   Eyes: Negative for blurred vision, photophobia and discharge.  Respiratory: Negative for cough, hemoptysis and shortness of breath.   Cardiovascular: Negative for chest pain, palpitations, orthopnea and leg swelling.  Gastrointestinal: Negative for abdominal pain, constipation, diarrhea, heartburn, nausea and vomiting.  Genitourinary: Negative for dysuria, flank pain, frequency and urgency.  Musculoskeletal: Positive for back pain. Negative for joint pain and myalgias.  Skin: Negative for itching and rash.  Neurological: Positive for weakness. Negative for tremors, seizures and headaches.  Endo/Heme/Allergies: Negative for polydipsia. Does not bruise/bleed easily.  Psychiatric/Behavioral: Negative for depression and substance abuse. The patient is not nervous/anxious and does not have insomnia.      Lab Results  Component Value Date   WBC 9.0 12/23/2018   HGB 12.4 (L) 12/23/2018   HCT 35.1 (L) 12/23/2018   MCV 80.0 12/23/2018   PLT 237 12/23/2018    Lab Results  Component Value Date   CREATININE 0.65  12/24/2018   BUN 7 12/24/2018   NA 134 (L) 12/24/2018   K 3.4 (L) 12/24/2018   CL 98 12/24/2018   CO2 25 12/24/2018    Lab Results  Component Value Date   ALT 64 (H) 12/20/2018   AST 46 (H) 12/20/2018   ALKPHOS 82 12/20/2018     Microbiology: Recent Results (from the past 240 hour(s))  Blood culture (routine x 2)     Status: Abnormal   Collection Time: 12/19/18  9:00 AM   Specimen: BLOOD RIGHT ARM  Result Value Ref Range Status   Specimen Description BLOOD RIGHT ARM  Final   Special Requests   Final    BOTTLES DRAWN AEROBIC AND ANAEROBIC Blood Culture adequate volume   Culture  Setup Time   Final    IN BOTH AEROBIC AND ANAEROBIC BOTTLES GRAM POSITIVE COCCI CRITICAL VALUE NOTED.  VALUE IS CONSISTENT WITH PREVIOUSLY REPORTED AND CALLED VALUE.    Culture (A)  Final    STAPHYLOCOCCUS AUREUS SUSCEPTIBILITIES PERFORMED ON PREVIOUS CULTURE WITHIN THE LAST 5 DAYS. Performed at [facility], [place] [address] [place], [place] [address]    Report Status 12/22/2018 FINAL  Final  Blood culture (routine x 2)     Status: Abnormal   Collection Time: 12/19/18  9:01 AM   Specimen: BLOOD  Result Value Ref Range Status   Specimen Description BLOOD SITE NOT SPECIFIED  Final   Special Requests   Final    BOTTLES DRAWN AEROBIC AND ANAEROBIC Blood Culture results may not be optimal due to an inadequate volume of blood received in culture bottles   Culture  Setup Time   Final    IN BOTH AEROBIC AND ANAEROBIC BOTTLES GRAM POSITIVE COCCI Organism ID to follow CRITICAL RESULT CALLED TO, READ BACK BY AND VERIFIED WITH: PHRMD L SEAY  @0310  12/20/18 BY S GEZAHEGN Performed at [facility], [place] [address], [place], [place] [address]    Culture STAPHYLOCOCCUS AUREUS (A)  Final   Report Status 12/22/2018 FINAL  Final   Organism ID, Bacteria STAPHYLOCOCCUS AUREUS  Final      Susceptibility   Staphylococcus aureus - MIC*    CIPROFLOXACIN <=0.5 SENSITIVE Sensitive     ERYTHROMYCIN <=0.25  SENSITIVE Sensitive     GENTAMICIN <=0.5 SENSITIVE Sensitive     OXACILLIN 0.5 SENSITIVE Sensitive     TETRACYCLINE >=16 RESISTANT Resistant     VANCOMYCIN 1 SENSITIVE Sensitive     TRIMETH/SULFA <=10 SENSITIVE Sensitive     CLINDAMYCIN <=0.25 SENSITIVE Sensitive     RIFAMPIN <=0.5 SENSITIVE Sensitive     Inducible Clindamycin NEGATIVE Sensitive     * STAPHYLOCOCCUS AUREUS  Blood Culture ID Panel (Reflexed)     Status: Abnormal   Collection Time: 12/19/18  9:01 AM  Result Value Ref Range Status   Enterococcus species NOT DETECTED NOT DETECTED Final   Listeria monocytogenes NOT DETECTED NOT DETECTED Final   Staphylococcus species DETECTED (A) NOT DETECTED Final    Comment: CRITICAL RESULT CALLED TO, READ BACK BY AND VERIFIED WITH: PHRMD L SEAY  @0310   12/20/18 BY S GEZAHEGN    Staphylococcus aureus (BCID) DETECTED (A) NOT DETECTED Final    Comment: Methicillin (oxacillin) susceptible Staphylococcus aureus (MSSA). Preferred therapy is anti staphylococcal beta lactam antibiotic (Cefazolin or Nafcillin), unless clinically contraindicated. CRITICAL RESULT CALLED TO, READ BACK BY AND VERIFIED WITH: PHRMD L SEAY  @0310  12/20/18 BY S GEZAHEGN    Methicillin resistance NOT DETECTED NOT DETECTED Final   Streptococcus species NOT DETECTED NOT DETECTED Final   Streptococcus agalactiae NOT DETECTED NOT DETECTED Final   Streptococcus pneumoniae NOT DETECTED NOT DETECTED Final   Streptococcus pyogenes NOT DETECTED NOT DETECTED Final   Acinetobacter baumannii NOT DETECTED NOT DETECTED Final   Enterobacteriaceae species NOT DETECTED NOT DETECTED Final   Enterobacter cloacae complex NOT DETECTED NOT DETECTED Final   Escherichia coli NOT DETECTED NOT DETECTED Final   Klebsiella oxytoca NOT DETECTED NOT DETECTED Final   Klebsiella pneumoniae NOT DETECTED NOT DETECTED Final   Proteus species NOT DETECTED NOT DETECTED Final   Serratia marcescens NOT DETECTED NOT DETECTED Final   Haemophilus influenzae NOT  DETECTED NOT DETECTED Final   Neisseria meningitidis NOT DETECTED NOT DETECTED Final   Pseudomonas aeruginosa NOT DETECTED NOT DETECTED Final   Candida albicans NOT DETECTED NOT DETECTED Final   Candida glabrata NOT DETECTED NOT DETECTED Final   Candida krusei NOT DETECTED NOT DETECTED Final   Candida parapsilosis NOT DETECTED NOT DETECTED Final   Candida tropicalis NOT DETECTED NOT DETECTED Final    Comment: Performed at [facility], [place] [address] [place], [place] [address]  SARS Coronavirus 2 [facility] order, Performed in [facility] hospital lab) Nasopharyngeal Nasopharyngeal Swab     Status: None   Collection Time: 12/19/18 11:04 AM   Specimen: Nasopharyngeal Swab  Result Value Ref Range Status   SARS Coronavirus 2 NEGATIVE NEGATIVE Final    Comment: (NOTE) If result is NEGATIVE SARS-CoV-2 target nucleic acids are NOT DETECTED. The SARS-CoV-2 RNA is generally detectable in upper and lower  respiratory specimens during the acute phase of infection. The lowest  concentration of SARS-CoV-2 viral copies this assay can detect is 250  copies / mL. A negative result does not preclude SARS-CoV-2 infection  and should not be used as the sole basis for treatment or other  patient management decisions.  A negative result may occur with  improper specimen collection / handling, submission of specimen other  than nasopharyngeal swab, presence of viral mutation(s) within the  areas targeted by this assay, and inadequate number of viral copies  (<250 copies / mL). A negative result must be combined with clinical  observations, patient history, and epidemiological information. If result is POSITIVE SARS-CoV-2 target nucleic acids are DETECTED. The SARS-CoV-2 RNA is generally detectable in upper and lower  respiratory specimens dur ing the acute phase of infection.  Positive  results are indicative of active infection with SARS-CoV-2.  Clinical  correlation with patient history and  other diagnostic information is  necessary to determine patient infection status.  Positive results do  not rule out bacterial infection or co-infection with other viruses. If result is PRESUMPTIVE POSTIVE SARS-CoV-2 nucleic acids MAY BE PRESENT.   A presumptive positive result was obtained on the submitted specimen  and confirmed on repeat testing.  While 2019 novel coronavirus  (SARS-CoV-2) nucleic acids may be present in the submitted sample  additional confirmatory testing may be necessary for epidemiological  and / or clinical management purposes  to differentiate between  SARS-CoV-2 and other Sarbecovirus currently known  to infect humans.  If clinically indicated additional testing with an alternate test  methodology [phone]) is advised. The SARS-CoV-2 RNA is generally  detectable in upper and lower respiratory sp ecimens during the acute  phase of infection. The expected result is Negative. Fact Sheet for Patients:  StrictlyIdeas.no Fact Sheet for Healthcare Providers: BankingDealers.co.za This test is not yet approved or cleared by the [place] FDA and has been authorized for detection and/or diagnosis of SARS-CoV-2 by FDA under an Emergency Use Authorization (EUA).  This EUA will remain in effect (meaning this test can be used) for the duration of the COVID-19 declaration under Section 564(b)(1) of the Act, 21 U.S.C. section 360bbb-3(b)(1), unless the authorization is terminated or revoked sooner. Performed at [facility], [place] [address] [place], [place] [address]   MRSA PCR Screening     Status: None   Collection Time: 12/19/18  8:57 PM   Specimen: Nasal Mucosa; Nasopharyngeal  Result Value Ref Range Status   MRSA by PCR NEGATIVE NEGATIVE Final    Comment:        The GeneXpert MRSA Assay (FDA approved for NASAL specimens only), is one component of a comprehensive MRSA colonization surveillance program. It is  not intended to diagnose MRSA infection nor to guide or monitor treatment for MRSA infections. Performed at [facility], [place] [address] [place], [place] [address]   Culture, blood (routine x 2)     Status: None (Preliminary result)   Collection Time: 12/21/18  5:39 AM   Specimen: BLOOD  Result Value Ref Range Status   Specimen Description BLOOD LEFT ANTECUBITAL  Final   Special Requests   Final    BOTTLES DRAWN AEROBIC ONLY Blood Culture adequate volume   Culture   Final    NO GROWTH 3 DAYS Performed at [facility], [place] [address] [place], [place] [address]    Report Status PENDING  Incomplete  Culture, blood (routine x 2)     Status: None (Preliminary result)   Collection Time: 12/21/18  5:45 AM   Specimen: BLOOD RIGHT HAND  Result Value Ref Range Status   Specimen Description BLOOD RIGHT HAND  Final   Special Requests   Final    BOTTLES DRAWN AEROBIC ONLY Blood Culture adequate volume   Culture   Final    NO GROWTH 3 DAYS Performed at [facility], [place] [address] [place], [place] [address]    Report Status PENDING  Incomplete  Aerobic/Anaerobic Culture (surgical/deep wound)     Status: None (Preliminary result)   Collection Time: 12/23/18 11:25 AM   Specimen: Abscess  Result Value Ref Range Status   Specimen Description   Final    ABSCESS LAVAGE ASP RT L4 L5 FACET Performed at [facility], [address] [place], [place] [address]    Special Requests Normal  Final   Gram Stain   Final    FEW WBC PRESENT, PREDOMINANTLY PMN NO ORGANISMS SEEN    Culture   Final    FEW STAPHYLOCOCCUS AUREUS SUSCEPTIBILITIES TO FOLLOW Performed at [facility], [place] [address], [place], [place] [address]    Report Status PENDING  Incomplete    Impression/Plan:The patient is a 43 year old white male with known IV drug use history and remote h/o basal cell carcinoma admitted with progressive back pain subsequently found to have MSSA sepsis and  multilevel lumbar epidural abscess and left sided pneumonia.   1. MSSA sepsis/TVIE -both of  the patient's initial blood cultures are currently growing MSSA.  I suspect that this is also the causative pathogen for his lumbar epidural abscesses and likely pneumonia with the latter concerning for possible septic pulmonary emboli.  Will continue nafcillin for now, anticipating a minimum of 6 week duration given his now confirmed TVIE on TTE. F/u repeat blood cultures (unrevealing thus far) in an effort to establish clearance of his bacteremia. Uncertain of utility of TEE as endocarditis has been confirmed and only trivial tricuspid regurgitation was noted and EF was preserved.  2. Lumbar epidural abscesses -MRI imaging shows a complex lumbar fluid collections suggestive of 2 distinct epidural abscesses and L4-L5 diskitis/septic facet arthritis.  Patient has control of his bowel and bladder at this time but does have significant lower extremity weakness.  Neurosurgery deferred operative intervention but IR aspirated one of his lumbar epidural abscesses yesterday. Given concern for diskitis, plan for an 8 week course of IV ABX in total (likely with nafcillin if he can tolerate).  3. IVDU -the patient states that he has completed rehab in the past for both of his narcotic/heroin and methamphetamine addictions.  An HIV antibody was negative here.  Viral hepatitis panel is positive for HCV exposure. Will send confirmatory testing with HCV genotype and HCV VL to assess for chronic infection. The acuity of the patient's symptoms and complexity of his lumbar epidural abscess combined with the pathogen isolated indicates a very acute/recent exposure is likely been within the last several days or at the greatest length 1 week prior to his admission.  This would make his report to me of his last drug use of 7 months ago quite implausible.

## 2018-12-24 NOTE — Progress Notes (Signed)
Chaplain visited patient as a result of a consult for an advanced directive.  The chaplain gave the patient information concerning the advanced directive.  The chaplain will follow-up after the patient consults with the family.  Brion Aliment Chaplain Resident For questions concerning this note please contact me by pager [phone]

## 2018-12-24 NOTE — Progress Notes (Signed)
PROGRESS NOTE  Bradley Weber [id number] DOB: Sep 06, 1975 DOA: 12/19/2018 PCP: Patient, No Pcp Per  HPI/Recap of past 24 hours: Patient is a 43 year old male with history of IV drug abuse, basal carcinoma of forehead and back, who presented with severe low back pain.  Last heroin use 2 days before admission.  He was also complaining of cough, fever, chills, decreased appetite, weight loss, shortness of breath, nausea and vomiting.  Work-up in the emergency department showed lumbar epidural abscess.  Chest x-ray was concerning for pneumonia.  ID, neurosurgery were consulted.  Echocardiogram showed tricuspid valve endocarditis.  ID following for antibiotics.  12/24/18: Patient was seen and examined his bedside this morning.  He denies any chest pain or dyspnea at rest.  Reports constipation no bowel movement few days.  Bowel regimen.  Cardiology consulted for possible TEE.  Assessment/Plan: Principal Problem:   Spinal epidural abscess Active Problems:   Community acquired pneumonia   AKI (acute kidney injury) ([place])   Transaminitis  Sepsis secondary to MSSA bacteremia/epidural abscess/tricuspid valve endocarditis/community-acquired pneumonia Presented with a fever, leukocytosis and evidence for pneumonia Blood cultures later revealed MSSA and infectious disease consulted Sensitivities with resistance to tetracycline.   Currently on Nafcillin MRI showed 2 epidural abscesses at L4-L5 drained on 12/23/2018 by interventional radiology Currently afebrile with no leukocytosis Continue IV antibiotics Continue analgesic  Tricuspid valve endocarditis 2D echo done on 12/20/2018 abnormal, moderately thickened, with multiple tricuspid valve vegetation on the anterior leaflet measuring 10 x 20 mm Cardiology paged for TEE on 12/24/2018 Continue IV antibiotics with plan for 6 to 8 weeks course of treatment Infectious disease following  Epidural abscess at L4-L5 post drainage on 12/23/2018 by interventional  radiology Body fluid Gram stain no organisms seen Body fluid culture in process Continue IV antibiotics Continue pain management  Polysubstance abuse including IV drug use  Polysubstance abuse cessation counseling done at bedside UDS positive for opiates and amphetamines  Positive Hepatitis C screening GI referral at discharge  Hypokalemia Potassium 3.4 Repleted with KCl 40 mEq Added 2 g IV magnesium  Chronic constipation in the setting of opiate abuse Start bowel regimen Senokot 2 tablets twice daily and MiraLAX daily  Physical debility PT OT to assess    DVT prophylaxis: Lovenox Code Status: Full Family Communication: Discussed with the patient    Disposition Plan:  Patient is currently not appropriate for discharge at this time due to ongoing work-up for newly diagnosed endocarditis, MSSA bacteremia requiring IV antibiotics in the setting of IV drug abuse.  Plan to discharge possibly to SNF versus CIR when infectious disease signs off.   Consultants: ID, neurosurgery  Procedures: MRI     Objective: Vitals:   12/24/18 0023 12/24/18 0300 12/24/18 0744 12/24/18 1130  BP:  119/85 125/78 109/74  Pulse:  61 85 70  Resp: 17 16 13 11   Temp:  98.6 F (37 C) 98.7 F (37.1 C) 98.7 F (37.1 C)  TempSrc:  Oral Oral Oral  SpO2:  99% 97% 97%  Weight:      Height:        Intake/Output Summary (Last 24 hours) at 12/24/2018 1204 Last data filed at 12/24/2018 0900 Gross per 24 hour  Intake 1590 ml  Output 750 ml  Net 840 ml   Filed Weights   12/19/18 0518 12/19/18 2036  Weight: 79.4 kg 76.1 kg    Exam:  . General: 43 y.o. year-old male well developed well nourished in no acute distress.  Alert and oriented  x3. . Cardiovascular: Regular rate and rhythm with no rubs or gallops.  No thyromegaly or JVD noted.   Marland Kitchen Respiratory: Clear to auscultation with no wheezes or rales. Good inspiratory effort. . Abdomen: Soft nontender nondistended with normal bowel sounds  x4 quadrants. . Musculoskeletal: No lower extremity edema. 2/4 pulses in all 4 extremities. . Skin: No ulcerative lesions noted or rashes, needle tracks affecting upper extremities bilaterally. Marland Kitchen Psychiatry: Mood is appropriate for condition and setting   Data Reviewed: CBC: Recent Labs  Lab 12/19/18 0530 12/19/18 2143 12/20/18 0207 12/22/18 1239 12/23/18 0225  WBC 29.5* 13.9* 14.9* 8.6 9.0  NEUTROABS  --   --  12.9*  --   --   HGB 16.0 13.7 13.3 12.8* 12.4*  HCT 46.9 40.4 40.3 36.3* 35.1*  MCV 85.4 84.9 86.1 80.5 80.0  PLT 205 169 163 226 [id number]   Basic Metabolic Panel: Recent Labs  Lab 12/19/18 0530 12/19/18 2143 12/20/18 0207 12/23/18 0225 12/24/18 0257  NA 137  --  134* 131* 134*  K 3.7  --  3.9 2.9* 3.4*  CL 98  --  98 95* 98  CO2 25  --  24 24 25   GLUCOSE 103*  --  128* 114* 92  BUN 16  --  9 8 7   CREATININE 1.92* 1.16 1.00 0.62 0.65  CALCIUM 9.1  --  8.5* 8.3* 8.5*  MG  --  1.7  --  1.6*  --    GFR: Estimated Creatinine Clearance: 126.8 mL/min (by C-G formula based on SCr of 0.65 mg/dL). Liver Function Tests: Recent Labs  Lab 12/19/18 0530 12/20/18 0207  AST 70* 46*  ALT 97* 64*  ALKPHOS 112 82  BILITOT 1.8* 1.2  PROT 7.4 6.4*  ALBUMIN 3.6 2.8*   Recent Labs  Lab 12/19/18 0530  LIPASE 20   No results for input(s): AMMONIA in the last 168 hours. Coagulation Profile: Recent Labs  Lab 12/19/18 2143  INR 1.1   Cardiac Enzymes: No results for input(s): CKTOTAL, CKMB, CKMBINDEX, TROPONINI in the last 168 hours. BNP (last 3 results) No results for input(s): PROBNP in the last 8760 hours. HbA1C: No results for input(s): HGBA1C in the last 72 hours. CBG: No results for input(s): GLUCAP in the last 168 hours. Lipid Profile: No results for input(s): CHOL, HDL, LDLCALC, TRIG, CHOLHDL, LDLDIRECT in the last 72 hours. Thyroid Function Tests: No results for input(s): TSH, T4TOTAL, FREET4, T3FREE, THYROIDAB in the last 72 hours. Anemia Panel: No  results for input(s): VITAMINB12, FOLATE, FERRITIN, TIBC, IRON, RETICCTPCT in the last 72 hours. Urine analysis:    Component Value Date/Time   COLORURINE AMBER (A) 12/19/2018 0524   APPEARANCEUR CLOUDY (A) 12/19/2018 0524   LABSPEC 1.024 12/19/2018 0524   PHURINE 5.0 12/19/2018 0524   GLUCOSEU 50 (A) 12/19/2018 0524   HGBUR SMALL (A) 12/19/2018 0524   BILIRUBINUR NEGATIVE 12/19/2018 0524   KETONESUR NEGATIVE 12/19/2018 0524   PROTEINUR 100 (A) 12/19/2018 0524   NITRITE NEGATIVE 12/19/2018 0524   LEUKOCYTESUR NEGATIVE 12/19/2018 0524   Sepsis Labs: @LABRCNTIP (procalcitonin:4,lacticidven:4)  ) Recent Results (from the past 240 hour(s))  Blood culture (routine x 2)     Status: Abnormal   Collection Time: 12/19/18  9:00 AM   Specimen: BLOOD RIGHT ARM  Result Value Ref Range Status   Specimen Description BLOOD RIGHT ARM  Final   Special Requests   Final    BOTTLES DRAWN AEROBIC AND ANAEROBIC Blood Culture adequate volume   Culture  Setup  Time   Final    IN BOTH AEROBIC AND ANAEROBIC BOTTLES GRAM POSITIVE COCCI CRITICAL VALUE NOTED.  VALUE IS CONSISTENT WITH PREVIOUSLY REPORTED AND CALLED VALUE.    Culture (A)  Final    STAPHYLOCOCCUS AUREUS SUSCEPTIBILITIES PERFORMED ON PREVIOUS CULTURE WITHIN THE LAST 5 DAYS. Performed at [facility], [place] [address] [place], [place] [address]    Report Status 12/22/2018 FINAL  Final  Blood culture (routine x 2)     Status: Abnormal   Collection Time: 12/19/18  9:01 AM   Specimen: BLOOD  Result Value Ref Range Status   Specimen Description BLOOD SITE NOT SPECIFIED  Final   Special Requests   Final    BOTTLES DRAWN AEROBIC AND ANAEROBIC Blood Culture results may not be optimal due to an inadequate volume of blood received in culture bottles   Culture  Setup Time   Final    IN BOTH AEROBIC AND ANAEROBIC BOTTLES GRAM POSITIVE COCCI Organism ID to follow CRITICAL RESULT CALLED TO, READ BACK BY AND VERIFIED WITH: PHRMD L SEAY  @0310   12/20/18 BY S GEZAHEGN Performed at [facility], [place] [address], [place], [place] [address]    Culture STAPHYLOCOCCUS AUREUS (A)  Final   Report Status 12/22/2018 FINAL  Final   Organism ID, Bacteria STAPHYLOCOCCUS AUREUS  Final      Susceptibility   Staphylococcus aureus - MIC*    CIPROFLOXACIN <=0.5 SENSITIVE Sensitive     ERYTHROMYCIN <=0.25 SENSITIVE Sensitive     GENTAMICIN <=0.5 SENSITIVE Sensitive     OXACILLIN 0.5 SENSITIVE Sensitive     TETRACYCLINE >=16 RESISTANT Resistant     VANCOMYCIN 1 SENSITIVE Sensitive     TRIMETH/SULFA <=10 SENSITIVE Sensitive     CLINDAMYCIN <=0.25 SENSITIVE Sensitive     RIFAMPIN <=0.5 SENSITIVE Sensitive     Inducible Clindamycin NEGATIVE Sensitive     * STAPHYLOCOCCUS AUREUS  Blood Culture ID Panel (Reflexed)     Status: Abnormal   Collection Time: 12/19/18  9:01 AM  Result Value Ref Range Status   Enterococcus species NOT DETECTED NOT DETECTED Final   Listeria monocytogenes NOT DETECTED NOT DETECTED Final   Staphylococcus species DETECTED (A) NOT DETECTED Final    Comment: CRITICAL RESULT CALLED TO, READ BACK BY AND VERIFIED WITH: PHRMD L SEAY  @0310  12/20/18 BY S GEZAHEGN    Staphylococcus aureus (BCID) DETECTED (A) NOT DETECTED Final    Comment: Methicillin (oxacillin) susceptible Staphylococcus aureus (MSSA). Preferred therapy is anti staphylococcal beta lactam antibiotic (Cefazolin or Nafcillin), unless clinically contraindicated. CRITICAL RESULT CALLED TO, READ BACK BY AND VERIFIED WITH: PHRMD L SEAY  @0310  12/20/18 BY S GEZAHEGN    Methicillin resistance NOT DETECTED NOT DETECTED Final   Streptococcus species NOT DETECTED NOT DETECTED Final   Streptococcus agalactiae NOT DETECTED NOT DETECTED Final   Streptococcus pneumoniae NOT DETECTED NOT DETECTED Final   Streptococcus pyogenes NOT DETECTED NOT DETECTED Final   Acinetobacter baumannii NOT DETECTED NOT DETECTED Final   Enterobacteriaceae species NOT DETECTED NOT DETECTED Final    Enterobacter cloacae complex NOT DETECTED NOT DETECTED Final   Escherichia coli NOT DETECTED NOT DETECTED Final   Klebsiella oxytoca NOT DETECTED NOT DETECTED Final   Klebsiella pneumoniae NOT DETECTED NOT DETECTED Final   Proteus species NOT DETECTED NOT DETECTED Final   Serratia marcescens NOT DETECTED NOT DETECTED Final   Haemophilus influenzae NOT DETECTED NOT DETECTED Final   Neisseria meningitidis NOT DETECTED NOT DETECTED Final   Pseudomonas aeruginosa  NOT DETECTED NOT DETECTED Final   Candida albicans NOT DETECTED NOT DETECTED Final   Candida glabrata NOT DETECTED NOT DETECTED Final   Candida krusei NOT DETECTED NOT DETECTED Final   Candida parapsilosis NOT DETECTED NOT DETECTED Final   Candida tropicalis NOT DETECTED NOT DETECTED Final    Comment: Performed at [facility], [place] [address] [place], [place] [address]  SARS Coronavirus 2 [facility] order, Performed in [facility] hospital lab) Nasopharyngeal Nasopharyngeal Swab     Status: None   Collection Time: 12/19/18 11:04 AM   Specimen: Nasopharyngeal Swab  Result Value Ref Range Status   SARS Coronavirus 2 NEGATIVE NEGATIVE Final    Comment: (NOTE) If result is NEGATIVE SARS-CoV-2 target nucleic acids are NOT DETECTED. The SARS-CoV-2 RNA is generally detectable in upper and lower  respiratory specimens during the acute phase of infection. The lowest  concentration of SARS-CoV-2 viral copies this assay can detect is 250  copies / mL. A negative result does not preclude SARS-CoV-2 infection  and should not be used as the sole basis for treatment or other  patient management decisions.  A negative result may occur with  improper specimen collection / handling, submission of specimen other  than nasopharyngeal swab, presence of viral mutation(s) within the  areas targeted by this assay, and inadequate number of viral copies  (<250 copies / mL). A negative result must be combined with clinical  observations, patient  history, and epidemiological information. If result is POSITIVE SARS-CoV-2 target nucleic acids are DETECTED. The SARS-CoV-2 RNA is generally detectable in upper and lower  respiratory specimens dur ing the acute phase of infection.  Positive  results are indicative of active infection with SARS-CoV-2.  Clinical  correlation with patient history and other diagnostic information is  necessary to determine patient infection status.  Positive results do  not rule out bacterial infection or co-infection with other viruses. If result is PRESUMPTIVE POSTIVE SARS-CoV-2 nucleic acids MAY BE PRESENT.   A presumptive positive result was obtained on the submitted specimen  and confirmed on repeat testing.  While 2019 novel coronavirus  (SARS-CoV-2) nucleic acids may be present in the submitted sample  additional confirmatory testing may be necessary for epidemiological  and / or clinical management purposes  to differentiate between  SARS-CoV-2 and other Sarbecovirus currently known to infect humans.  If clinically indicated additional testing with an alternate test  methodology [phone]) is advised. The SARS-CoV-2 RNA is generally  detectable in upper and lower respiratory sp ecimens during the acute  phase of infection. The expected result is Negative. Fact Sheet for Patients:  StrictlyIdeas.no Fact Sheet for Healthcare Providers: BankingDealers.co.za This test is not yet approved or cleared by the [place] FDA and has been authorized for detection and/or diagnosis of SARS-CoV-2 by FDA under an Emergency Use Authorization (EUA).  This EUA will remain in effect (meaning this test can be used) for the duration of the COVID-19 declaration under Section 564(b)(1) of the Act, 21 U.S.C. section 360bbb-3(b)(1), unless the authorization is terminated or revoked sooner. Performed at [facility], [place] [address], [place], [place] [address]    MRSA PCR Screening     Status: None   Collection Time: 12/19/18  8:57 PM   Specimen: Nasal Mucosa; Nasopharyngeal  Result Value Ref Range Status   MRSA by PCR NEGATIVE NEGATIVE Final    Comment:        The GeneXpert MRSA Assay (FDA approved for NASAL specimens only), is  one component of a comprehensive MRSA colonization surveillance program. It is not intended to diagnose MRSA infection nor to guide or monitor treatment for MRSA infections. Performed at [facility], [place] [address] [place], [place] [address]   Culture, blood (routine x 2)     Status: None (Preliminary result)   Collection Time: 12/21/18  5:39 AM   Specimen: BLOOD  Result Value Ref Range Status   Specimen Description BLOOD LEFT ANTECUBITAL  Final   Special Requests   Final    BOTTLES DRAWN AEROBIC ONLY Blood Culture adequate volume   Culture   Final    NO GROWTH 3 DAYS Performed at [facility], [place] [address] [place], [place] [address]    Report Status PENDING  Incomplete  Culture, blood (routine x 2)     Status: None (Preliminary result)   Collection Time: 12/21/18  5:45 AM   Specimen: BLOOD RIGHT HAND  Result Value Ref Range Status   Specimen Description BLOOD RIGHT HAND  Final   Special Requests   Final    BOTTLES DRAWN AEROBIC ONLY Blood Culture adequate volume   Culture   Final    NO GROWTH 3 DAYS Performed at [facility], [place] [address] [place], [place] [address]    Report Status PENDING  Incomplete  Aerobic/Anaerobic Culture (surgical/deep wound)     Status: None (Preliminary result)   Collection Time: 12/23/18 11:25 AM   Specimen: Abscess  Result Value Ref Range Status   Specimen Description   Final    ABSCESS LAVAGE ASP RT L4 L5 FACET Performed at [facility], [address] [place], [place] [address]    Special Requests Normal  Final   Gram Stain   Final    FEW WBC PRESENT, PREDOMINANTLY PMN NO ORGANISMS SEEN    Culture   Final    FEW STAPHYLOCOCCUS  AUREUS SUSCEPTIBILITIES TO FOLLOW Performed at [facility], [place] [address] [place], [place] [address]    Report Status PENDING  Incomplete      Studies: No results found.  Scheduled Meds: . enoxaparin (LOVENOX) injection  40 mg Subcutaneous Q24H  . pantoprazole  40 mg Oral Daily  . polyethylene glycol  17 g Oral Daily  . senna-docusate  2 tablet Oral BID  . sodium chloride flush  3 mL Intravenous Q12H    Continuous Infusions: . sodium chloride    . nafcillin IV 2 g (12/24/18 1034)     LOS: 5 days     Kayleen Memos, MD [facility] Pager [phone]  If 7PM-7AM, please contact night-coverage [email or web address] Password [facility] 12/24/2018, 12:04 PM

## 2018-12-25 LAB — BASIC METABOLIC PANEL
Anion gap: 10 (ref 5–15)
BUN: 12 mg/dL (ref 6–20)
CO2: 24 mmol/L (ref 22–32)
Calcium: 8.6 mg/dL — ABNORMAL LOW (ref 8.9–10.3)
Chloride: 100 mmol/L (ref 98–111)
Creatinine, Ser: 0.64 mg/dL (ref 0.61–1.24)
GFR calc Af Amer: >60 mL/min (ref >60)
GFR calc non Af Amer: >60 mL/min (ref >60)
Glucose, Bld: 101 mg/dL — ABNORMAL HIGH (ref 70–99)
Potassium: 3.9 mmol/L (ref 3.5–5.1)
Sodium: 134 mmol/L — ABNORMAL LOW (ref 135–145)

## 2018-12-25 MED ORDER — SODIUM CHLORIDE 0.9% FLUSH: 10.0000 mL | INTRAVENOUS | Status: DC | PRN

## 2018-12-25 MED ORDER — SODIUM CHLORIDE 0.9% FLUSH
10.0000 mL | Freq: Two times a day (BID) | INTRAVENOUS | Status: DC
  Administered 2018-12-25 – 2019-01-07 (×18): 10 mL

## 2018-12-25 NOTE — Progress Notes (Signed)
[facility]   Reason for visit: Follow up on MSSA TVIE/lumbar abscess  Antibiotics: day 7 Nafcillin, day 3 + cefazolin for 4 days  Interval History: pt NPO but unclear if TEE scheduled for today. Spoke with pt today w/o his mother present at bedside and he now admits to daily heroin injection PTA (states he didn't want to disappoint his mother. He confirmed he does not live with her currently). Fever curve, WBC trends, cx results, imaging, and ABX usage all independently reviewed    Current Facility-Administered Medications:  .  0.9 %  sodium chloride infusion, 250 mL, Intravenous, PRN, Pahwani, Rinka R, MD .  acetaminophen (TYLENOL) tablet 650 mg, 650 mg, Oral, Q6H PRN, Pahwani, Rinka R, MD, 650 mg at 12/21/18 1706 .  enoxaparin (LOVENOX) injection 40 mg, 40 mg, Subcutaneous, Q24H, Pahwani, Rinka R, MD, 40 mg at 12/23/18 2100 .  morphine 2 MG/ML injection 1 mg, 1 mg, Intravenous, Q6H PRN, [place] Shi, MD, 1 mg at 12/24/18 2113 .  nafcillin 2 g in sodium chloride 0.9 % 100 mL IVPB, 2 g, Intravenous, Q4H, Carlyle Basques, MD, Last Rate: 200 mL/hr at 12/25/18 1037, 2 g at 12/25/18 1037 .  ondansetron (ZOFRAN) injection 4 mg, 4 mg, Intravenous, Q6H PRN, Pahwani, Rinka R, MD, 4 mg at 12/21/18 1714 .  pantoprazole (PROTONIX) EC tablet 40 mg, 40 mg, Oral, Daily, Ronna Polio, RPH, 40 mg at 12/25/18 1035 .  polyethylene glycol (MIRALAX / GLYCOLAX) packet 17 g, 17 g, Oral, Daily, Hall, Carole N, DO, 17 g at 12/24/18 1246 .  senna-docusate (Senokot-S) tablet 2 tablet, 2 tablet, Oral, BID, Kayleen Memos, DO, 2 tablet at 12/25/18 1035 .  sodium chloride flush (NS) 0.9 % injection 3 mL, 3 mL, Intravenous, Q12H, Pahwani, Rinka R, MD, 3 mL at 12/25/18 1038 .  sodium chloride flush (NS) 0.9 % injection 3 mL, 3 mL, Intravenous, PRN, Pahwani, Rinka R, MD .  traMADol (ULTRAM) tablet 50 mg, 50 mg, Oral, Q6H PRN, [place] Shi, MD, 50 mg at 12/24/18 1944    Physical Exam:   Vitals:   12/25/18 0300 12/25/18 0737  BP:  114/83  Pulse:  62  Resp: 17 18  Temp:  98.4 F (36.9 C)  SpO2:  96%   Physical Exam Gen: disheveled appearance, remains evasive with several questions, anxious, mild distress secondary to low back pain, A&Ox 3 Head: NCAT, mild bitemporal wasting evident EENT: PERRL, EOMI, MMM, adequate dentition Neck: supple, no JVD CV: NRRR, I/VI SEM at LLSB Pulm: CTA bilaterally, mild expiratory wheeze, rare retractions on BNC Abd: soft, NTND, +BS (hypoactive) Extrems:  1+ non-pitting LE edema, 1+ pulses Skin: no rashes,occasional scratches to both LEs, +abrasion to LT forehead improving, BCC lesion above bridge of nose w/o drainage or erythema, adequate skin turgor Neuro: CN II-XII grossly intact, no focal neurologic deficits appreciated, gait was not assessed, A&Ox 3   Review of Systems:  Review of Systems  Constitutional: Positive for chills and fever. Negative for weight loss.  HENT: Negative for congestion, hearing loss, sinus pain and sore throat.   Eyes: Negative for blurred vision, photophobia and discharge.  Respiratory: Negative for cough, hemoptysis and shortness of breath.   Cardiovascular: Negative for chest pain, palpitations, orthopnea and leg swelling.  Gastrointestinal: Negative for abdominal pain, constipation, diarrhea, heartburn, nausea and vomiting.  Genitourinary: Negative for dysuria, flank pain, frequency and urgency.  Musculoskeletal: Positive for back pain. Negative for joint pain and myalgias.  Skin: Negative for itching and rash.  Neurological: Positive for weakness. Negative for tremors, seizures and headaches.  Endo/Heme/Allergies: Negative for polydipsia. Does not bruise/bleed easily.  Psychiatric/Behavioral: Negative for depression and substance abuse. The patient is not nervous/anxious and does not have insomnia.      Lab Results  Component Value Date   WBC 9.0 12/23/2018   HGB 12.4 (L)  12/23/2018   HCT 35.1 (L) 12/23/2018   MCV 80.0 12/23/2018   PLT 237 12/23/2018    Lab Results  Component Value Date   CREATININE 0.64 12/25/2018   BUN 12 12/25/2018   NA 134 (L) 12/25/2018   K 3.9 12/25/2018   CL 100 12/25/2018   CO2 24 12/25/2018    Lab Results  Component Value Date   ALT 64 (H) 12/20/2018   AST 46 (H) 12/20/2018   ALKPHOS 82 12/20/2018     Microbiology: Recent Results (from the past 240 hour(s))  Blood culture (routine x 2)     Status: Abnormal   Collection Time: 12/19/18  9:00 AM   Specimen: BLOOD RIGHT ARM  Result Value Ref Range Status   Specimen Description BLOOD RIGHT ARM  Final   Special Requests   Final    BOTTLES DRAWN AEROBIC AND ANAEROBIC Blood Culture adequate volume   Culture  Setup Time   Final    IN BOTH AEROBIC AND ANAEROBIC BOTTLES GRAM POSITIVE COCCI CRITICAL VALUE NOTED.  VALUE IS CONSISTENT WITH PREVIOUSLY REPORTED AND CALLED VALUE.    Culture (A)  Final    STAPHYLOCOCCUS AUREUS SUSCEPTIBILITIES PERFORMED ON PREVIOUS CULTURE WITHIN THE LAST 5 DAYS. Performed at [facility], [place] [address], [place], [place] [address]    Report Status 12/22/2018 FINAL  Final  Blood culture (routine x 2)     Status: Abnormal   Collection Time: 12/19/18  9:01 AM   Specimen: BLOOD  Result Value Ref Range Status   Specimen Description BLOOD SITE NOT SPECIFIED  Final   Special Requests   Final    BOTTLES DRAWN AEROBIC AND ANAEROBIC Blood Culture results may not be optimal due to an inadequate volume of blood received in culture bottles   Culture  Setup Time   Final    IN BOTH AEROBIC AND ANAEROBIC BOTTLES GRAM POSITIVE COCCI Organism ID to follow CRITICAL RESULT CALLED TO, READ BACK BY AND VERIFIED WITH: PHRMD L SEAY  @0310  12/20/18 BY S GEZAHEGN Performed at [facility], [place] [address], [place], [place] [address]    Culture STAPHYLOCOCCUS AUREUS (A)  Final   Report Status 12/22/2018 FINAL  Final   Organism ID, Bacteria STAPHYLOCOCCUS  AUREUS  Final      Susceptibility   Staphylococcus aureus - MIC*    CIPROFLOXACIN <=0.5 SENSITIVE Sensitive     ERYTHROMYCIN <=0.25 SENSITIVE Sensitive     GENTAMICIN <=0.5 SENSITIVE Sensitive     OXACILLIN 0.5 SENSITIVE Sensitive     TETRACYCLINE >=16 RESISTANT Resistant     VANCOMYCIN 1 SENSITIVE Sensitive     TRIMETH/SULFA <=10 SENSITIVE Sensitive     CLINDAMYCIN <=0.25 SENSITIVE Sensitive     RIFAMPIN <=0.5 SENSITIVE Sensitive     Inducible Clindamycin NEGATIVE Sensitive     * STAPHYLOCOCCUS AUREUS  Blood Culture ID Panel (Reflexed)     Status: Abnormal   Collection Time: 12/19/18  9:01 AM  Result Value Ref Range Status   Enterococcus species NOT DETECTED NOT DETECTED Final   Listeria monocytogenes NOT DETECTED NOT DETECTED Final  Staphylococcus species DETECTED (A) NOT DETECTED Final    Comment: CRITICAL RESULT CALLED TO, READ BACK BY AND VERIFIED WITH: PHRMD L SEAY  @0310  12/20/18 BY S GEZAHEGN    Staphylococcus aureus (BCID) DETECTED (A) NOT DETECTED Final    Comment: Methicillin (oxacillin) susceptible Staphylococcus aureus (MSSA). Preferred therapy is anti staphylococcal beta lactam antibiotic (Cefazolin or Nafcillin), unless clinically contraindicated. CRITICAL RESULT CALLED TO, READ BACK BY AND VERIFIED WITH: PHRMD L SEAY  @0310  12/20/18 BY S GEZAHEGN    Methicillin resistance NOT DETECTED NOT DETECTED Final   Streptococcus species NOT DETECTED NOT DETECTED Final   Streptococcus agalactiae NOT DETECTED NOT DETECTED Final   Streptococcus pneumoniae NOT DETECTED NOT DETECTED Final   Streptococcus pyogenes NOT DETECTED NOT DETECTED Final   Acinetobacter baumannii NOT DETECTED NOT DETECTED Final   Enterobacteriaceae species NOT DETECTED NOT DETECTED Final   Enterobacter cloacae complex NOT DETECTED NOT DETECTED Final   Escherichia coli NOT DETECTED NOT DETECTED Final   Klebsiella oxytoca NOT DETECTED NOT DETECTED Final   Klebsiella pneumoniae NOT DETECTED NOT DETECTED Final    Proteus species NOT DETECTED NOT DETECTED Final   Serratia marcescens NOT DETECTED NOT DETECTED Final   Haemophilus influenzae NOT DETECTED NOT DETECTED Final   Neisseria meningitidis NOT DETECTED NOT DETECTED Final   Pseudomonas aeruginosa NOT DETECTED NOT DETECTED Final   Candida albicans NOT DETECTED NOT DETECTED Final   Candida glabrata NOT DETECTED NOT DETECTED Final   Candida krusei NOT DETECTED NOT DETECTED Final   Candida parapsilosis NOT DETECTED NOT DETECTED Final   Candida tropicalis NOT DETECTED NOT DETECTED Final    Comment: Performed at [facility], [place] [address], [place], [place] [address]  SARS Coronavirus 2 [facility] order, Performed in [facility] hospital lab) Nasopharyngeal Nasopharyngeal Swab     Status: None   Collection Time: 12/19/18 11:04 AM   Specimen: Nasopharyngeal Swab  Result Value Ref Range Status   SARS Coronavirus 2 NEGATIVE NEGATIVE Final    Comment: (NOTE) If result is NEGATIVE SARS-CoV-2 target nucleic acids are NOT DETECTED. The SARS-CoV-2 RNA is generally detectable in upper and lower  respiratory specimens during the acute phase of infection. The lowest  concentration of SARS-CoV-2 viral copies this assay can detect is 250  copies / mL. A negative result does not preclude SARS-CoV-2 infection  and should not be used as the sole basis for treatment or other  patient management decisions.  A negative result may occur with  improper specimen collection / handling, submission of specimen other  than nasopharyngeal swab, presence of viral mutation(s) within the  areas targeted by this assay, and inadequate number of viral copies  (<250 copies / mL). A negative result must be combined with clinical  observations, patient history, and epidemiological information. If result is POSITIVE SARS-CoV-2 target nucleic acids are DETECTED. The SARS-CoV-2 RNA is generally detectable in upper and lower  respiratory specimens dur ing the acute phase of  infection.  Positive  results are indicative of active infection with SARS-CoV-2.  Clinical  correlation with patient history and other diagnostic information is  necessary to determine patient infection status.  Positive results do  not rule out bacterial infection or co-infection with other viruses. If result is PRESUMPTIVE POSTIVE SARS-CoV-2 nucleic acids MAY BE PRESENT.   A presumptive positive result was obtained on the submitted specimen  and confirmed on repeat testing.  While 2019 novel coronavirus  (SARS-CoV-2) nucleic acids may be present in the submitted sample  additional confirmatory testing may be necessary for epidemiological  and / or clinical management purposes  to differentiate between  SARS-CoV-2 and other Sarbecovirus currently known to infect humans.  If clinically indicated additional testing with an alternate test  methodology [phone]) is advised. The SARS-CoV-2 RNA is generally  detectable in upper and lower respiratory sp ecimens during the acute  phase of infection. The expected result is Negative. Fact Sheet for Patients:  StrictlyIdeas.no Fact Sheet for Healthcare Providers: BankingDealers.co.za This test is not yet approved or cleared by the [place] FDA and has been authorized for detection and/or diagnosis of SARS-CoV-2 by FDA under an Emergency Use Authorization (EUA).  This EUA will remain in effect (meaning this test can be used) for the duration of the COVID-19 declaration under Section 564(b)(1) of the Act, 21 U.S.C. section 360bbb-3(b)(1), unless the authorization is terminated or revoked sooner. Performed at [facility], [place] [address] [place], [place] [address]   MRSA PCR Screening     Status: None   Collection Time: 12/19/18  8:57 PM   Specimen: Nasal Mucosa; Nasopharyngeal  Result Value Ref Range Status   MRSA by PCR NEGATIVE NEGATIVE Final    Comment:        The GeneXpert MRSA  Assay (FDA approved for NASAL specimens only), is one component of a comprehensive MRSA colonization surveillance program. It is not intended to diagnose MRSA infection nor to guide or monitor treatment for MRSA infections. Performed at [facility], [place] [address], [place], [place] [address]   Culture, blood (routine x 2)     Status: None (Preliminary result)   Collection Time: 12/21/18  5:39 AM   Specimen: BLOOD  Result Value Ref Range Status   Specimen Description BLOOD LEFT ANTECUBITAL  Final   Special Requests   Final    BOTTLES DRAWN AEROBIC ONLY Blood Culture adequate volume   Culture   Final    NO GROWTH 4 DAYS Performed at [facility], [place] [address] [place] [address] [place], [place] [address]    Report Status PENDING  Incomplete  Culture, blood (routine x 2)     Status: None (Preliminary result)   Collection Time: 12/21/18  5:45 AM   Specimen: BLOOD RIGHT HAND  Result Value Ref Range Status   Specimen Description BLOOD RIGHT HAND  Final   Special Requests   Final    BOTTLES DRAWN AEROBIC ONLY Blood Culture adequate volume   Culture   Final    NO GROWTH 4 DAYS Performed at [facility], [place] [address] [place], [place] [address]    Report Status PENDING  Incomplete  Aerobic/Anaerobic Culture (surgical/deep wound)     Status: None (Preliminary result)   Collection Time: 12/23/18 11:25 AM   Specimen: Abscess  Result Value Ref Range Status   Specimen Description   Final    ABSCESS LAVAGE ASP RT L4 L5 FACET Performed at [facility], [address], [place], [place] [address]    Special Requests Normal  Final   Gram Stain   Final    FEW WBC PRESENT, PREDOMINANTLY PMN NO ORGANISMS SEEN Performed at [facility], [place] [address] [place], [place] [address]    Culture FEW STAPHYLOCOCCUS AUREUS  Final   Report Status PENDING  Incomplete   Organism ID, Bacteria STAPHYLOCOCCUS AUREUS  Final      Susceptibility   Staphylococcus aureus - MIC*     CIPROFLOXACIN <=0.5 SENSITIVE Sensitive  ERYTHROMYCIN <=0.25 SENSITIVE Sensitive     GENTAMICIN <=0.5 SENSITIVE Sensitive     OXACILLIN 0.5 SENSITIVE Sensitive     TETRACYCLINE >=16 RESISTANT Resistant     VANCOMYCIN 1 SENSITIVE Sensitive     TRIMETH/SULFA <=10 SENSITIVE Sensitive     CLINDAMYCIN <=0.25 SENSITIVE Sensitive     RIFAMPIN <=0.5 SENSITIVE Sensitive     Inducible Clindamycin NEGATIVE Sensitive     * FEW STAPHYLOCOCCUS AUREUS    Impression/Plan:The patient is a 43 year old white male with known IV drug use history and remote h/o basal cell carcinoma admitted with progressive back pain subsequently found to have MSSA sepsis and multilevel lumbar epidural abscess and left sided pneumonia.   1. MSSA sepsis/TVIE -both of the patient's initial blood cultures are currently growing MSSA.  I suspect that this is also the causative pathogen for his lumbar epidural abscesses and likely pneumonia with the latter concerning for possible septic pulmonary emboli.  Will continue nafcillin for now, anticipating an 8 week duration given his now confirmed TVIE on TTE and lumbar discitis. F/u repeat blood cultures (unrevealing thus far) in an effort to establish clearance of his bacteremia. May place PICC for purposes of long-term IV access but pt is not a candidate for OP IV ABX from home given his active IVDU that led to his infection and admission here. Uncertain of utility of TEE as endocarditis has been confirmed and only trivial tricuspid regurgitation was noted and EF was preserved. Would repeat TTE as completion of ABX course nears to re-assess tricuspid valve.  2. Lumbar epidural abscesses -MRI imaging shows a complex lumbar fluid collections suggestive of 2 distinct epidural abscesses and L4-L5 diskitis/septic facet arthritis.  Patient has control of his bowel and bladder at this time but does have significant lower extremity weakness.  Neurosurgery deferred operative intervention but IR  aspirated one of his lumbar epidural abscesses yesterday. Given concern for diskitis, plan for an 8 week course of IV ABX in total (likely with nafcillin if he can tolerate). Tentative d/c date for nafcillin is 02/12/2019. Would check BMP at least twice weekly as nafcillin has a propensity to cause ARF over time.  3. IVDU -the patient states that he has completed rehab in the past for both of his narcotic/heroin and methamphetamine addictions.  An HIV antibody was negative here.  Viral hepatitis panel is positive for HCV exposure. Will f/u confirmatory testing with HCV genotype and HCV VL to assess for chronic infection. The acuity of the patient's symptoms and complexity of his lumbar epidural abscess combined with the pathogen isolated indicates a very acute/recent exposure, which he now admits to me when his mother is not present at bedside.  Will sign off, please call with ?s

## 2018-12-25 NOTE — Progress Notes (Signed)
PROGRESS NOTE  Bradley Weber [id number] DOB: 13-Aug-1975 DOA: 12/19/2018 PCP: Patient, No Pcp Per  HPI/Recap of past 24 hours: Patient is a 43 year old male with history of IV drug abuse, basal carcinoma of forehead and back, who presented with severe low back pain.  Last heroin use 2 days before admission.  He was also complaining of cough, fever, chills, decreased appetite, weight loss, shortness of breath, nausea and vomiting.  Work-up in the emergency department showed lumbar epidural abscess.  Chest x-ray was concerning for pneumonia.  ID, neurosurgery were consulted.  Echocardiogram showed tricuspid valve endocarditis.  ID following for antibiotics.  12/25/18: Patient seen and examined at his bedside this morning.  No acute events overnight.  He has no new complaints.  His lower back pain is well controlled.  Constipation has resolved.  He had 1 bowel movement yesterday.  N.p.o. for possible TEE.  Assessment/Plan: Principal Problem:   Spinal epidural abscess Active Problems:   Community acquired pneumonia   AKI (acute kidney injury) ([place])   Transaminitis  Sepsis secondary to MSSA bacteremia/epidural abscess/tricuspid valve endocarditis/community-acquired pneumonia Presented with a fever, leukocytosis and evidence for pneumonia Blood cultures later revealed MSSA and infectious disease consulted Sensitivities with resistance to tetracycline.   Currently on Nafcillin MRI showed 2 epidural abscesses at L4-L5 drained on 12/23/2018 by interventional radiology Currently afebrile with no leukocytosis T-max this morning 99.8 Abscess culture grew staph aureus, sensitivities are pending Repeated blood cultures done on 12/21/2018- x3 days. Continue IV antibiotics, Planned course between 6 and 8 weeks Continue analgesics as needed  Newly diagnosed tricuspid valve endocarditis 2D echo done on 12/20/2018 abnormal, moderately thickened, with multiple tricuspid valve vegetation on the anterior  leaflet measuring 10 x 20 mm Cardiology consulted for TEE on 12/24/2018 Continue IV antibiotics with plan for 6 to 8 weeks course of treatment Infectious disease following  Epidural abscess at L4-L5 post drainage on 12/23/2018 by interventional radiology Body fluid Gram stain no organisms seen Body fluid culture in process Continue IV antibiotics Continue pain management  Hypokalemia, resolved post repletion Potassium 3.9 on 12/25/2018 from 3.4 previously  Hypomagnesemia Magnesium 1.6 on 12/23/2018 Repleted with 4 g magnesium once  Polysubstance abuse including IV drug use  Polysubstance abuse cessation counseling done at bedside UDS positive for opiates and amphetamines  Positive Hepatitis C screening Will need GI referral at discharge  Chronic constipation in the setting of opiate abuse Start bowel regimen Senokot 2 tablets twice daily and MiraLAX daily  Physical debility PT OT to assess    DVT prophylaxis: Lovenox Code Status: Full Family Communication: Discussed with the patient    Disposition Plan:  Patient is currently not appropriate for discharge at this time due to ongoing work-up for newly diagnosed endocarditis, MSSA bacteremia requiring IV antibiotics in the setting of IV drug abuse.  Plan to discharge possibly to SNF versus CIR when infectious disease signs off.   Consultants: ID, neurosurgery  Procedures: MRI     Objective: Vitals:   12/24/18 2300 12/25/18 0004 12/25/18 0300 12/25/18 0737  BP:  122/77  114/83  Pulse:    62  Resp: (!) 9  17 18   Temp:  99.8 F (37.7 C)  98.4 F (36.9 C)  TempSrc:  Oral  Oral  SpO2:    96%  Weight:      Height:        Intake/Output Summary (Last 24 hours) at 12/25/2018 1024 Last data filed at 12/25/2018 0230 Gross per 24 hour  Intake  203 ml  Output -  Net 203 ml   Filed Weights   12/19/18 0518 12/19/18 2036  Weight: 79.4 kg 76.1 kg    Exam:  . General: 43 y.o. year-old male well-developed well-nourished  in no acute distress.  Alert and oriented x3.   . Cardiovascular: Regular rate and rhythm no rubs or gallops no JVD or thyromegaly noted.   Marland Kitchen Respiratory: Clear to auscultation no wheezes or rales.  Poor inspiratory effort.   . Abdomen: Soft nontender nondistended normal bowel sounds present.  . Musculoskeletal: No lower extremity edema.  2 out of 4 pulses in all 4 extremities. . Skin: Needle tracts and upper extremities bilaterally. Marland Kitchen Psychiatry: Mood is appropriate for condition and setting.   Data Reviewed: CBC: Recent Labs  Lab 12/19/18 0530 12/19/18 2143 12/20/18 0207 12/22/18 1239 12/23/18 0225  WBC 29.5* 13.9* 14.9* 8.6 9.0  NEUTROABS  --   --  12.9*  --   --   HGB 16.0 13.7 13.3 12.8* 12.4*  HCT 46.9 40.4 40.3 36.3* 35.1*  MCV 85.4 84.9 86.1 80.5 80.0  PLT 205 169 163 226 [id number]   Basic Metabolic Panel: Recent Labs  Lab 12/19/18 0530 12/19/18 2143 12/20/18 0207 12/23/18 0225 12/24/18 0257 12/25/18 0246  NA 137  --  134* 131* 134* 134*  K 3.7  --  3.9 2.9* 3.4* 3.9  CL 98  --  98 95* 98 100  CO2 25  --  24 24 25 24   GLUCOSE 103*  --  128* 114* 92 101*  BUN 16  --  9 8 7 12   CREATININE 1.92* 1.16 1.00 0.62 0.65 0.64  CALCIUM 9.1  --  8.5* 8.3* 8.5* 8.6*  MG  --  1.7  --  1.6*  --   --    GFR: Estimated Creatinine Clearance: 126.8 mL/min (by C-G formula based on SCr of 0.64 mg/dL). Liver Function Tests: Recent Labs  Lab 12/19/18 0530 12/20/18 0207  AST 70* 46*  ALT 97* 64*  ALKPHOS 112 82  BILITOT 1.8* 1.2  PROT 7.4 6.4*  ALBUMIN 3.6 2.8*   Recent Labs  Lab 12/19/18 0530  LIPASE 20   No results for input(s): AMMONIA in the last 168 hours. Coagulation Profile: Recent Labs  Lab 12/19/18 2143  INR 1.1   Cardiac Enzymes: No results for input(s): CKTOTAL, CKMB, CKMBINDEX, TROPONINI in the last 168 hours. BNP (last 3 results) No results for input(s): PROBNP in the last 8760 hours. HbA1C: No results for input(s): HGBA1C in the last 72 hours. CBG:  No results for input(s): GLUCAP in the last 168 hours. Lipid Profile: No results for input(s): CHOL, HDL, LDLCALC, TRIG, CHOLHDL, LDLDIRECT in the last 72 hours. Thyroid Function Tests: No results for input(s): TSH, T4TOTAL, FREET4, T3FREE, THYROIDAB in the last 72 hours. Anemia Panel: No results for input(s): VITAMINB12, FOLATE, FERRITIN, TIBC, IRON, RETICCTPCT in the last 72 hours. Urine analysis:    Component Value Date/Time   COLORURINE AMBER (A) 12/19/2018 0524   APPEARANCEUR CLOUDY (A) 12/19/2018 0524   LABSPEC 1.024 12/19/2018 0524   PHURINE 5.0 12/19/2018 0524   GLUCOSEU 50 (A) 12/19/2018 0524   HGBUR SMALL (A) 12/19/2018 0524   BILIRUBINUR NEGATIVE 12/19/2018 0524   KETONESUR NEGATIVE 12/19/2018 0524   PROTEINUR 100 (A) 12/19/2018 0524   NITRITE NEGATIVE 12/19/2018 0524   LEUKOCYTESUR NEGATIVE 12/19/2018 0524   Sepsis Labs: @LABRCNTIP (procalcitonin:4,lacticidven:4)  ) Recent Results (from the past 240 hour(s))  Blood culture (routine x 2)  Status: Abnormal   Collection Time: 12/19/18  9:00 AM   Specimen: BLOOD RIGHT ARM  Result Value Ref Range Status   Specimen Description BLOOD RIGHT ARM  Final   Special Requests   Final    BOTTLES DRAWN AEROBIC AND ANAEROBIC Blood Culture adequate volume   Culture  Setup Time   Final    IN BOTH AEROBIC AND ANAEROBIC BOTTLES GRAM POSITIVE COCCI CRITICAL VALUE NOTED.  VALUE IS CONSISTENT WITH PREVIOUSLY REPORTED AND CALLED VALUE.    Culture (A)  Final    STAPHYLOCOCCUS AUREUS SUSCEPTIBILITIES PERFORMED ON PREVIOUS CULTURE WITHIN THE LAST 5 DAYS. Performed at [facility], [place] [address], [place], [place] [address]    Report Status 12/22/2018 FINAL  Final  Blood culture (routine x 2)     Status: Abnormal   Collection Time: 12/19/18  9:01 AM   Specimen: BLOOD  Result Value Ref Range Status   Specimen Description BLOOD SITE NOT SPECIFIED  Final   Special Requests   Final    BOTTLES DRAWN AEROBIC AND ANAEROBIC Blood  Culture results may not be optimal due to an inadequate volume of blood received in culture bottles   Culture  Setup Time   Final    IN BOTH AEROBIC AND ANAEROBIC BOTTLES GRAM POSITIVE COCCI Organism ID to follow CRITICAL RESULT CALLED TO, READ BACK BY AND VERIFIED WITH: PHRMD L SEAY  @0310  12/20/18 BY S GEZAHEGN Performed at [facility], [place] [address] [place], [place] [address]    Culture STAPHYLOCOCCUS AUREUS (A)  Final   Report Status 12/22/2018 FINAL  Final   Organism ID, Bacteria STAPHYLOCOCCUS AUREUS  Final      Susceptibility   Staphylococcus aureus - MIC*    CIPROFLOXACIN <=0.5 SENSITIVE Sensitive     ERYTHROMYCIN <=0.25 SENSITIVE Sensitive     GENTAMICIN <=0.5 SENSITIVE Sensitive     OXACILLIN 0.5 SENSITIVE Sensitive     TETRACYCLINE >=16 RESISTANT Resistant     VANCOMYCIN 1 SENSITIVE Sensitive     TRIMETH/SULFA <=10 SENSITIVE Sensitive     CLINDAMYCIN <=0.25 SENSITIVE Sensitive     RIFAMPIN <=0.5 SENSITIVE Sensitive     Inducible Clindamycin NEGATIVE Sensitive     * STAPHYLOCOCCUS AUREUS  Blood Culture ID Panel (Reflexed)     Status: Abnormal   Collection Time: 12/19/18  9:01 AM  Result Value Ref Range Status   Enterococcus species NOT DETECTED NOT DETECTED Final   Listeria monocytogenes NOT DETECTED NOT DETECTED Final   Staphylococcus species DETECTED (A) NOT DETECTED Final    Comment: CRITICAL RESULT CALLED TO, READ BACK BY AND VERIFIED WITH: PHRMD L SEAY  @0310  12/20/18 BY S GEZAHEGN    Staphylococcus aureus (BCID) DETECTED (A) NOT DETECTED Final    Comment: Methicillin (oxacillin) susceptible Staphylococcus aureus (MSSA). Preferred therapy is anti staphylococcal beta lactam antibiotic (Cefazolin or Nafcillin), unless clinically contraindicated. CRITICAL RESULT CALLED TO, READ BACK BY AND VERIFIED WITH: PHRMD L SEAY  @0310  12/20/18 BY S GEZAHEGN    Methicillin resistance NOT DETECTED NOT DETECTED Final   Streptococcus species NOT DETECTED NOT DETECTED Final    Streptococcus agalactiae NOT DETECTED NOT DETECTED Final   Streptococcus pneumoniae NOT DETECTED NOT DETECTED Final   Streptococcus pyogenes NOT DETECTED NOT DETECTED Final   Acinetobacter baumannii NOT DETECTED NOT DETECTED Final   Enterobacteriaceae species NOT DETECTED NOT DETECTED Final   Enterobacter cloacae complex NOT DETECTED NOT DETECTED Final   Escherichia coli NOT DETECTED NOT DETECTED Final   Klebsiella  oxytoca NOT DETECTED NOT DETECTED Final   Klebsiella pneumoniae NOT DETECTED NOT DETECTED Final   Proteus species NOT DETECTED NOT DETECTED Final   Serratia marcescens NOT DETECTED NOT DETECTED Final   Haemophilus influenzae NOT DETECTED NOT DETECTED Final   Neisseria meningitidis NOT DETECTED NOT DETECTED Final   Pseudomonas aeruginosa NOT DETECTED NOT DETECTED Final   Candida albicans NOT DETECTED NOT DETECTED Final   Candida glabrata NOT DETECTED NOT DETECTED Final   Candida krusei NOT DETECTED NOT DETECTED Final   Candida parapsilosis NOT DETECTED NOT DETECTED Final   Candida tropicalis NOT DETECTED NOT DETECTED Final    Comment: Performed at [facility], [place] [address], [place], [place] [address]  SARS Coronavirus 2 [facility] order, Performed in [facility] hospital lab) Nasopharyngeal Nasopharyngeal Swab     Status: None   Collection Time: 12/19/18 11:04 AM   Specimen: Nasopharyngeal Swab  Result Value Ref Range Status   SARS Coronavirus 2 NEGATIVE NEGATIVE Final    Comment: (NOTE) If result is NEGATIVE SARS-CoV-2 target nucleic acids are NOT DETECTED. The SARS-CoV-2 RNA is generally detectable in upper and lower  respiratory specimens during the acute phase of infection. The lowest  concentration of SARS-CoV-2 viral copies this assay can detect is 250  copies / mL. A negative result does not preclude SARS-CoV-2 infection  and should not be used as the sole basis for treatment or other  patient management decisions.  A negative result may occur with  improper  specimen collection / handling, submission of specimen other  than nasopharyngeal swab, presence of viral mutation(s) within the  areas targeted by this assay, and inadequate number of viral copies  (<250 copies / mL). A negative result must be combined with clinical  observations, patient history, and epidemiological information. If result is POSITIVE SARS-CoV-2 target nucleic acids are DETECTED. The SARS-CoV-2 RNA is generally detectable in upper and lower  respiratory specimens dur ing the acute phase of infection.  Positive  results are indicative of active infection with SARS-CoV-2.  Clinical  correlation with patient history and other diagnostic information is  necessary to determine patient infection status.  Positive results do  not rule out bacterial infection or co-infection with other viruses. If result is PRESUMPTIVE POSTIVE SARS-CoV-2 nucleic acids MAY BE PRESENT.   A presumptive positive result was obtained on the submitted specimen  and confirmed on repeat testing.  While 2019 novel coronavirus  (SARS-CoV-2) nucleic acids may be present in the submitted sample  additional confirmatory testing may be necessary for epidemiological  and / or clinical management purposes  to differentiate between  SARS-CoV-2 and other Sarbecovirus currently known to infect humans.  If clinically indicated additional testing with an alternate test  methodology [phone]) is advised. The SARS-CoV-2 RNA is generally  detectable in upper and lower respiratory sp ecimens during the acute  phase of infection. The expected result is Negative. Fact Sheet for Patients:  StrictlyIdeas.no Fact Sheet for Healthcare Providers: BankingDealers.co.za This test is not yet approved or cleared by the [place] FDA and has been authorized for detection and/or diagnosis of SARS-CoV-2 by FDA under an Emergency Use Authorization (EUA).  This EUA will remain in  effect (meaning this test can be used) for the duration of the COVID-19 declaration under Section 564(b)(1) of the Act, 21 U.S.C. section 360bbb-3(b)(1), unless the authorization is terminated or revoked sooner. Performed at [facility], [place] [address] [place], [place] [address]   MRSA PCR Screening  Status: None   Collection Time: 12/19/18  8:57 PM   Specimen: Nasal Mucosa; Nasopharyngeal  Result Value Ref Range Status   MRSA by PCR NEGATIVE NEGATIVE Final    Comment:        The GeneXpert MRSA Assay (FDA approved for NASAL specimens only), is one component of a comprehensive MRSA colonization surveillance program. It is not intended to diagnose MRSA infection nor to guide or monitor treatment for MRSA infections. Performed at [facility], [place] [address], [place], [place] [address]   Culture, blood (routine x 2)     Status: None (Preliminary result)   Collection Time: 12/21/18  5:39 AM   Specimen: BLOOD  Result Value Ref Range Status   Specimen Description BLOOD LEFT ANTECUBITAL  Final   Special Requests   Final    BOTTLES DRAWN AEROBIC ONLY Blood Culture adequate volume   Culture   Final    NO GROWTH 4 DAYS Performed at [facility], [place] [address] [place], [place] [address]    Report Status PENDING  Incomplete  Culture, blood (routine x 2)     Status: None (Preliminary result)   Collection Time: 12/21/18  5:45 AM   Specimen: BLOOD RIGHT HAND  Result Value Ref Range Status   Specimen Description BLOOD RIGHT HAND  Final   Special Requests   Final    BOTTLES DRAWN AEROBIC ONLY Blood Culture adequate volume   Culture   Final    NO GROWTH 4 DAYS Performed at [facility], [place] [address] [place], [place] [address]    Report Status PENDING  Incomplete  Aerobic/Anaerobic Culture (surgical/deep wound)     Status: None (Preliminary result)   Collection Time: 12/23/18 11:25 AM   Specimen: Abscess  Result Value Ref Range Status   Specimen  Description   Final    ABSCESS LAVAGE ASP RT L4 L5 FACET Performed at [facility], [address], [place], [place] [address]    Special Requests Normal  Final   Gram Stain   Final    FEW WBC PRESENT, PREDOMINANTLY PMN NO ORGANISMS SEEN    Culture   Final    FEW STAPHYLOCOCCUS AUREUS SUSCEPTIBILITIES TO FOLLOW Performed at [facility], [place] [address] [place],  [address]    Report Status PENDING  Incomplete      Studies: No results found.  Scheduled Meds: . enoxaparin (LOVENOX) injection  40 mg Subcutaneous Q24H  . pantoprazole  40 mg Oral Daily  . polyethylene glycol  17 g Oral Daily  . senna-docusate  2 tablet Oral BID  . sodium chloride flush  3 mL Intravenous Q12H    Continuous Infusions: . sodium chloride    . nafcillin IV 2 g (12/25/18 0610)     LOS: 6 days     Kayleen Memos, MD [facility] Pager [phone]  If 7PM-7AM, please contact night-coverage [email or web address] Password [email or web address] 12/25/2018, 10:24 AM

## 2018-12-26 LAB — HCV RT-PCR, QUANT (NON-GRAPH)
HCV log10: 6.823 log10 IU/mL
Hepatitis C Quantitation: 6660000 IU/mL

## 2018-12-26 LAB — CREATININE, SERUM
Creatinine, Ser: 0.61 mg/dL (ref 0.61–1.24)
GFR calc Af Amer: >60 mL/min (ref >60)
GFR calc non Af Amer: >60 mL/min (ref >60)

## 2018-12-26 LAB — HCV AB W REFLEX TO QUANT PCR: HCV Ab: >11 s/co ratio — ABNORMAL HIGH (ref 0.0–0.9)

## 2018-12-26 MED ORDER — SODIUM CHLORIDE 0.9 % IV SOLN
INTRAVENOUS | Status: DC
  Administered 2018-12-26: 22:00:00 via INTRAVENOUS

## 2018-12-26 NOTE — Progress Notes (Signed)
PROGRESS NOTE  Bradley Weber [id number] DOB: 1975-04-26 DOA: 12/19/2018 PCP: Patient, No Pcp Per  Brief History   Patient is a 43 year old male with history of IV drug abuse, basal carcinoma of forehead and back, who presented with severe low back pain. Last heroin use 2 days before admission. He was also complaining of cough, fever, chills, decreased appetite, weight loss, shortness of breath, nausea and vomiting. Work-up in the emergency department showed lumbar epidural abscess. Chest x-ray was concerning for pneumonia. ID, neurosurgery were consulted. Echocardiogram showed tricuspid valve endocarditis. ID following for antibiotics. The patient will undergo TEE tomorrow to better evaluate his tricuspid valve.   Consultants  . Interventional radiology . Infection Disease . Neurosurgery  Procedures  . Drainage of L4, L5 epidural abscess by IR.  Antibiotics   Anti-infectives (From admission, onward)   Start     Dose/Rate Route Frequency Ordered Stop   12/23/18 1430  nafcillin 2 g in sodium chloride 0.9 % 100 mL IVPB     2 g 200 mL/hr over 30 Minutes Intravenous Every 4 hours 12/23/18 1402 02/12/19 2359   12/23/18 1415  nafcillin injection 2 g  Status:  Discontinued     2 g Intravenous Every 4 hours 12/23/18 1308 12/23/18 1402   12/20/18 1400  ceFAZolin (ANCEF) IVPB 2g/100 mL premix  Status:  Discontinued     2 g 200 mL/hr over 30 Minutes Intravenous Every 8 hours 12/20/18 0752 12/23/18 1308   12/20/18 1200  azithromycin (ZITHROMAX) 500 mg in sodium chloride 0.9 % 250 mL IVPB  Status:  Discontinued     500 mg 250 mL/hr over 60 Minutes Intravenous Every 24 hours 12/19/18 1840 12/20/18 1239   12/20/18 0600  ceFAZolin (ANCEF) IVPB 1 g/50 mL premix  Status:  Discontinued     1 g 100 mL/hr over 30 Minutes Intravenous Every 8 hours 12/20/18 0318 12/20/18 0752   12/20/18 0000  vancomycin (VANCOCIN) IVPB 750 mg/150 ml premix  Status:  Discontinued     750 mg 150 mL/hr over 60  Minutes Intravenous Every 12 hours 12/19/18 1840 12/20/18 0317   12/20/18 0000  cefTRIAXone (ROCEPHIN) 2 g in sodium chloride 0.9 % 100 mL IVPB  Status:  Discontinued     2 g 200 mL/hr over 30 Minutes Intravenous Every 12 hours 12/19/18 1840 12/20/18 0734   12/19/18 1130  vancomycin (VANCOCIN) 1,500 mg in sodium chloride 0.9 % 500 mL IVPB     1,500 mg 250 mL/hr over 120 Minutes Intravenous  Once 12/19/18 1129 12/19/18 1723   12/19/18 1130  cefTRIAXone (ROCEPHIN) 2 g in sodium chloride 0.9 % 100 mL IVPB     2 g 200 mL/hr over 30 Minutes Intravenous  Once 12/19/18 1129 12/19/18 1226   12/19/18 1130  azithromycin (ZITHROMAX) 500 mg in sodium chloride 0.9 % 250 mL IVPB     500 mg 250 mL/hr over 60 Minutes Intravenous  Once 12/19/18 1129 12/19/18 1610     Subjective  The patient is resting comfortably. No new complaints.  Objective   Vitals:  Vitals:   12/26/18 0807 12/26/18 1106  BP: 119/70 107/70  Pulse: 62 71  Resp: 15 10  Temp: 98.2 F (36.8 C) 98.1 F (36.7 C)  SpO2: 97% 97%    Exam:  Constitutional:  . The patient is awake, alert, and oriented x 3. No acute distress. Respiratory:  . There is no increased work of breathing. . No wheezes, rales, or rhonchi. . No tactile fremitus. Cardiovascular:  .  Regular rate and rhythm. . No murmurs, ectopy, or gallups. . No lateral PMI. No thrills. Abdomen:  . Abdomen is soft, non-tender, non-distended. . No hernias, masses, or organomegaly . Normoactive bowel sounds. Musculoskeletal:  . No cyanosis, clubbing, or edema Skin:  . No rashes, lesions, ulcers . palpation of skin: no induration or nodules Neurologic:  . CN 2-12 intact . Sensation all 4 extremities intact Psychiatric:  . Mental status o Mood, affect appropriate o Orientation to person, place, time  . judgment and insight appear intact    I have personally reviewed the following:   Today's Data  . Vitals, BMP 12/25/2018, Creatinine  Micro Data  . Blood  culture positive for Staph aureus.   Scheduled Meds: . enoxaparin (LOVENOX) injection  40 mg Subcutaneous Q24H  . pantoprazole  40 mg Oral Daily  . polyethylene glycol  17 g Oral Daily  . senna-docusate  2 tablet Oral BID  . sodium chloride flush  10-40 mL Intracatheter Q12H  . sodium chloride flush  3 mL Intravenous Q12H   Continuous Infusions: . sodium chloride    . nafcillin IV 2 g (12/26/18 1438)    Principal Problem:   Spinal epidural abscess Active Problems:   Community acquired pneumonia   AKI (acute kidney injury) ([place])   Transaminitis   LOS: 7 days   A & P   Sepsis secondary to MSSA bacteremia/epidural abscess/tricuspid valve endocarditis/community-acquired pneumonia:  Presented with a fever, leukocytosis and evidence for pneumonia. Blood cultures later revealed MSSA and infectious disease consulted. MRI showed 2 epidural abscesses at L4-L5 drained on 12/23/2018 by interventional radiology. Abscess culture grew staph aureus as well. The organism is sensitive to all except tetracycline. He is currently on Nafcillin. He will require 8 weeks of IV therapy. He is not a candidate for outpatient IV antibiotics due to history of IVDA.  Currently afebrile with no leukocytosis T-max this morning 99.8  Repeated blood cultures done on 12/21/2018- x3 days. Continue IV antibiotics, Planned course between 6 and 8 weeks Continue analgesics as needed  Newly diagnosed tricuspid valve endocarditis: 2D echo done on 12/20/2018 abnormal, moderately thickened, with multiple tricuspid valve vegetation on the anterior leaflet measuring 10 x 20 mm. Cardiology consulted for TEE on 12/24/2018. It will be performed on 12/27/2018. He will require 8 weeks of IV antibiotics. Infectious disease following.  Epidural abscess at L4-L5 post drainage on 12/23/2018 by interventional radiology: Culture has grown out Staph aureus which, like the isolate from blood cultures is sensitive to all except tetracycline.  Continue IV nafcilllin and pain management.  Hypokalemia: Resolved. Monitor.  Hypomagnesemia: Magnesium 1.6 on 12/23/2018. Repleted with 4 g magnesium once.  Polysubstance abuse including IV drug use: Polysubstance abuse cessation counseling done at bedside. UDS positive for opiates and amphetamines.  Positive Hepatitis C screening: Will need GI referral at discharge.  Chronic constipation in the setting of opiate abuse:  Start bowel regimen Senokot 2 tablets twice daily and MiraLAX daily.  Physical debility: PT OT to assess.  I have seen and examined this patient myself. I have spent 30 minutes in his evaluation and care.  DVT prophylaxis:Lovenox Code Status:Full Family Communication:Discussed with the patient. Mother at bedside. Disposition Plan: Patient is currently not appropriate for discharge at this time due to ongoing work-up for newly diagnosed endocarditis, MSSA bacteremia requiring IV antibiotics in the setting of IV drug abuse. Pt is not a candidate for outpatient IV antibiotics due to his history of IVDA.  Teaira Croft, DO [facility] Direct contact: see [email or web address]  7PM-7AM contact night coverage as above 12/26/2018, 3:11 PM  LOS: 7 days

## 2018-12-26 NOTE — H&P (View-Only) (Signed)
PROGRESS NOTE  Bradley Weber [id number] DOB: [birth year] DOA: 12/19/2018 PCP: Patient, No Pcp Per  Brief History   Patient is a 43 year old male with history of IV drug abuse, basal carcinoma of forehead and back, who presented with severe low back pain. Last heroin use 2 days before admission. He was also complaining of cough, fever, chills, decreased appetite, weight loss, shortness of breath, nausea and vomiting. Work-up in the emergency department showed lumbar epidural abscess. Chest x-ray was concerning for pneumonia. ID, neurosurgery were consulted. Echocardiogram showed tricuspid valve endocarditis. ID following for antibiotics. The patient will undergo TEE tomorrow to better evaluate his tricuspid valve.   Consultants  . Interventional radiology . Infection Disease . Neurosurgery  Procedures  . Drainage of L4, L5 epidural abscess by IR.  Antibiotics   Anti-infectives (From admission, onward)   Start     Dose/Rate Route Frequency Ordered Stop   12/23/18 1430  nafcillin 2 g in sodium chloride 0.9 % 100 mL IVPB     2 g 200 mL/hr over 30 Minutes Intravenous Every 4 hours 12/23/18 1402 02/12/19 2359   12/23/18 1415  nafcillin injection 2 g  Status:  Discontinued     2 g Intravenous Every 4 hours 12/23/18 1308 12/23/18 1402   12/20/18 1400  ceFAZolin (ANCEF) IVPB 2g/100 mL premix  Status:  Discontinued     2 g 200 mL/hr over 30 Minutes Intravenous Every 8 hours 12/20/18 0752 12/23/18 1308   12/20/18 1200  azithromycin (ZITHROMAX) 500 mg in sodium chloride 0.9 % 250 mL IVPB  Status:  Discontinued     500 mg 250 mL/hr over 60 Minutes Intravenous Every 24 hours 12/19/18 1840 12/20/18 1239   12/20/18 0600  ceFAZolin (ANCEF) IVPB 1 g/50 mL premix  Status:  Discontinued     1 g 100 mL/hr over 30 Minutes Intravenous Every 8 hours 12/20/18 0318 12/20/18 0752   12/20/18 0000  vancomycin (VANCOCIN) IVPB 750 mg/150 ml premix  Status:  Discontinued     750 mg 150 mL/hr over 60  Minutes Intravenous Every 12 hours 12/19/18 1840 12/20/18 0317   12/20/18 0000  cefTRIAXone (ROCEPHIN) 2 g in sodium chloride 0.9 % 100 mL IVPB  Status:  Discontinued     2 g 200 mL/hr over 30 Minutes Intravenous Every 12 hours 12/19/18 1840 12/20/18 0734   12/19/18 1130  vancomycin (VANCOCIN) 1,500 mg in sodium chloride 0.9 % 500 mL IVPB     1,500 mg 250 mL/hr over 120 Minutes Intravenous  Once 12/19/18 1129 12/19/18 1723   12/19/18 1130  cefTRIAXone (ROCEPHIN) 2 g in sodium chloride 0.9 % 100 mL IVPB     2 g 200 mL/hr over 30 Minutes Intravenous  Once 12/19/18 1129 12/19/18 1226   12/19/18 1130  azithromycin (ZITHROMAX) 500 mg in sodium chloride 0.9 % 250 mL IVPB     500 mg 250 mL/hr over 60 Minutes Intravenous  Once 12/19/18 1129 12/19/18 1610     Subjective  The patient is resting comfortably. No new complaints.  Objective   Vitals:  Vitals:   12/26/18 0807 12/26/18 1106  BP: 119/70 107/70  Pulse: 62 71  Resp: 15 10  Temp: 98.2 F (36.8 C) 98.1 F (36.7 C)  SpO2: 97% 97%    Exam:  Constitutional:  . The patient is awake, alert, and oriented x 3. No acute distress. Respiratory:  . There is no increased work of breathing. . No wheezes, rales, or rhonchi. . No tactile fremitus. Cardiovascular:  .  Regular rate and rhythm. . No murmurs, ectopy, or gallups. . No lateral PMI. No thrills. Abdomen:  . Abdomen is soft, non-tender, non-distended. . No hernias, masses, or organomegaly . Normoactive bowel sounds. Musculoskeletal:  . No cyanosis, clubbing, or edema Skin:  . No rashes, lesions, ulcers . palpation of skin: no induration or nodules Neurologic:  . CN 2-12 intact . Sensation all 4 extremities intact Psychiatric:  . Mental status o Mood, affect appropriate o Orientation to person, place, time  . judgment and insight appear intact    I have personally reviewed the following:   Today's Data  . Vitals, BMP 12/25/2018, Creatinine  Micro Data  . Blood  culture positive for Staph aureus.   Scheduled Meds: . enoxaparin (LOVENOX) injection  40 mg Subcutaneous Q24H  . pantoprazole  40 mg Oral Daily  . polyethylene glycol  17 g Oral Daily  . senna-docusate  2 tablet Oral BID  . sodium chloride flush  10-40 mL Intracatheter Q12H  . sodium chloride flush  3 mL Intravenous Q12H   Continuous Infusions: . sodium chloride    . nafcillin IV 2 g (12/26/18 1438)    Principal Problem:   Spinal epidural abscess Active Problems:   Community acquired pneumonia   AKI (acute kidney injury) ([place])   Transaminitis   LOS: 7 days   A & P   Sepsis secondary to MSSA bacteremia/epidural abscess/tricuspid valve endocarditis/community-acquired pneumonia:  Presented with a fever, leukocytosis and evidence for pneumonia. Blood cultures later revealed MSSA and infectious disease consulted. MRI showed 2 epidural abscesses at L4-L5 drained on 12/23/2018 by interventional radiology. Abscess culture grew staph aureus as well. The organism is sensitive to all except tetracycline. He is currently on Nafcillin. He will require 8 weeks of IV therapy. He is not a candidate for outpatient IV antibiotics due to history of IVDA.  Currently afebrile with no leukocytosis T-max this morning 99.8  Repeated blood cultures done on 12/21/2018- x3 days. Continue IV antibiotics, Planned course between 6 and 8 weeks Continue analgesics as needed  Newly diagnosed tricuspid valve endocarditis: 2D echo done on 12/20/2018 abnormal, moderately thickened, with multiple tricuspid valve vegetation on the anterior leaflet measuring 10 x 20 mm. Cardiology consulted for TEE on 12/24/2018. It will be performed on 12/27/2018. He will require 8 weeks of IV antibiotics. Infectious disease following.  Epidural abscess at L4-L5 post drainage on 12/23/2018 by interventional radiology: Culture has grown out Staph aureus which, like the isolate from blood cultures is sensitive to all except tetracycline.  Continue IV nafcilllin and pain management.  Hypokalemia: Resolved. Monitor.  Hypomagnesemia: Magnesium 1.6 on 12/23/2018. Repleted with 4 g magnesium once.  Polysubstance abuse including IV drug use: Polysubstance abuse cessation counseling done at bedside. UDS positive for opiates and amphetamines.  Positive Hepatitis C screening: Will need GI referral at discharge.  Chronic constipation in the setting of opiate abuse:  Start bowel regimen Senokot 2 tablets twice daily and MiraLAX daily.  Physical debility: PT OT to assess.  I have seen and examined this patient myself. I have spent 30 minutes in his evaluation and care.  DVT prophylaxis:Lovenox Code Status:Full Family Communication:Discussed with the patient. Mother at bedside. Disposition Plan: Patient is currently not appropriate for discharge at this time due to ongoing work-up for newly diagnosed endocarditis, MSSA bacteremia requiring IV antibiotics in the setting of IV drug abuse. Pt is not a candidate for outpatient IV antibiotics due to his history of IVDA.  Kamoria Lucien, DO [facility] Direct contact: see [email or web address]  7PM-7AM contact night coverage as above 12/26/2018, 3:11 PM  LOS: 7 days

## 2018-12-26 NOTE — Progress Notes (Signed)
    [facility] has been requested to perform a transesophageal echocardiogram on Mr. Bradley Weber for MSSA bacteremia.  After careful review of history and examination, the risks and benefits of transesophageal echocardiogram have been explained including risks of esophageal damage, perforation (1:10,000 risk), bleeding, pharyngeal hematoma as well as other potential complications associated with conscious sedation including aspiration, arrhythmia, respiratory failure and death. Alternatives to treatment were discussed, questions were answered. Patient is willing to proceed.  TEE - Dr. Fae Pippin @ 1015 12/17/2018 . NPO after midnight. Meds with sips.   Leanor Kail, PA-C 12/26/2018 12:35 PM

## 2018-12-27 ENCOUNTER — Inpatient Hospital Stay (HOSPITAL_COMMUNITY): Payer: Self-pay | Admitting: Certified Registered Nurse Anesthetist

## 2018-12-27 ENCOUNTER — Encounter (HOSPITAL_COMMUNITY)
Admission: EM | Discharge status: Against Medical Advice | Payer: Self-pay | Source: Home / Self Care | Attending: Internal Medicine

## 2018-12-27 ENCOUNTER — Inpatient Hospital Stay (HOSPITAL_COMMUNITY): Payer: Self-pay

## 2018-12-27 ENCOUNTER — Encounter (HOSPITAL_COMMUNITY): Payer: Self-pay

## 2018-12-27 DIAGNOSIS — R7881 Bacteremia: Secondary | ICD-10-CM

## 2018-12-27 DIAGNOSIS — Q211 Atrial septal defect: Secondary | ICD-10-CM

## 2018-12-27 DIAGNOSIS — I361 Nonrheumatic tricuspid (valve) insufficiency: Secondary | ICD-10-CM

## 2018-12-27 HISTORY — PX: TEE WITHOUT CARDIOVERSION: SHX5443

## 2018-12-27 LAB — COMPREHENSIVE METABOLIC PANEL
ALT: 41 U/L (ref 0–44)
AST: 34 U/L (ref 15–41)
Albumin: 2.6 g/dL — ABNORMAL LOW (ref 3.5–5.0)
Alkaline Phosphatase: 87 U/L (ref 38–126)
Anion gap: 8 (ref 5–15)
BUN: 10 mg/dL (ref 6–20)
CO2: 27 mmol/L (ref 22–32)
Calcium: 9 mg/dL (ref 8.9–10.3)
Chloride: 101 mmol/L (ref 98–111)
Creatinine, Ser: 0.71 mg/dL (ref 0.61–1.24)
GFR calc Af Amer: >60 mL/min (ref >60)
GFR calc non Af Amer: >60 mL/min (ref >60)
Glucose, Bld: 101 mg/dL — ABNORMAL HIGH (ref 70–99)
Potassium: 3.6 mmol/L (ref 3.5–5.1)
Sodium: 136 mmol/L (ref 135–145)
Total Bilirubin: 0.9 mg/dL (ref 0.3–1.2)
Total Protein: 6.9 g/dL (ref 6.5–8.1)

## 2018-12-27 LAB — CBC WITH DIFFERENTIAL/PLATELET
Abs Immature Granulocytes: 0.03 10*3/uL (ref 0.00–0.07)
Basophils Absolute: 0 10*3/uL (ref 0.0–0.1)
Basophils Relative: 1 %
Eosinophils Absolute: 0.3 10*3/uL (ref 0.0–0.5)
Eosinophils Relative: 4 %
HCT: 37.3 % — ABNORMAL LOW (ref 39.0–52.0)
Hemoglobin: 12.5 g/dL — ABNORMAL LOW (ref 13.0–17.0)
Immature Granulocytes: 1 %
Lymphocytes Relative: 37 %
Lymphs Abs: 2.5 10*3/uL (ref 0.7–4.0)
MCH: 28.5 pg (ref 26.0–34.0)
MCHC: 33.5 g/dL (ref 30.0–36.0)
MCV: 85 fL (ref 80.0–100.0)
Monocytes Absolute: 0.7 10*3/uL (ref 0.1–1.0)
Monocytes Relative: 11 %
Neutro Abs: 3.1 10*3/uL (ref 1.7–7.7)
Neutrophils Relative %: 46 %
Platelets: 367 10*3/uL (ref 150–400)
RBC: 4.39 MIL/uL (ref 4.22–5.81)
RDW: 12.6 % (ref 11.5–15.5)
WBC: 6.6 10*3/uL (ref 4.0–10.5)
nRBC: 0 % (ref 0.0–0.2)

## 2018-12-27 SURGERY — ECHOCARDIOGRAM, TRANSESOPHAGEAL
Anesthesia: General

## 2018-12-27 MED ORDER — PROPOFOL 500 MG/50ML IV EMUL
INTRAVENOUS | Status: DC | PRN
  Administered 2018-12-27: 100 ug/kg/min via INTRAVENOUS

## 2018-12-27 MED ORDER — BUTAMBEN-TETRACAINE-BENZOCAINE 2-2-14 % EX AERO
INHALATION_SPRAY | CUTANEOUS | Status: DC | PRN
  Administered 2018-12-27: 2 via TOPICAL

## 2018-12-27 MED ORDER — LIDOCAINE 2% (20 MG/ML) 5 ML SYRINGE
INTRAMUSCULAR | Status: DC | PRN
  Administered 2018-12-27: 40 mg via INTRAVENOUS

## 2018-12-27 MED ORDER — LACTATED RINGERS IV SOLN
INTRAVENOUS | Status: DC | PRN
  Administered 2018-12-27: 10:00:00 via INTRAVENOUS

## 2018-12-27 NOTE — Progress Notes (Signed)
Pt transferred from [facility], alert and oriented, denies any pain at this time, settled in bed with call light at bedside, safety concern addressed accordingly, pt reassured and will continue to monitor, v/s stable. Obasogie-Asidi, Chaitanya Amedee Efe

## 2018-12-27 NOTE — Progress Notes (Signed)
    Transesophageal Echocardiogram Note  Bradley Weber VN:[id number] 1975-12-20  Procedure: Transesophageal Echocardiogram Indications: bacteremia  Procedure Details Consent: Obtained Time Out: Verified patient identification, verified procedure, site/side was marked, verified correct patient position, special equipment/implants available, Radiology Safety Procedures followed,  medications/allergies/relevent history reviewed, required imaging and test results available.  Performed  Medications:  Pt sedated by anesthesia with lidocaine 40 mg and diprovan 148 mg IV total.  Normal LV function; no vegetations; mild TR; positive patent foramen ovale.   Complications: No apparent complications Patient did tolerate procedure well.  [name], MD

## 2018-12-27 NOTE — Progress Notes (Signed)
  Echocardiogram Echocardiogram Transesophageal has been performed.  Bradley Weber 12/27/2018, 12:16 PM

## 2018-12-27 NOTE — Anesthesia Postprocedure Evaluation (Signed)
Anesthesia Post Note  Patient: Bradley Weber  Procedure(s) Performed: TRANSESOPHAGEAL ECHOCARDIOGRAM (TEE) (N/A )     Patient location during evaluation: Endoscopy Anesthesia Type: General Level of consciousness: awake Pain management: satisfactory to patient Vital Signs Assessment: post-procedure vital signs reviewed and stable Respiratory status: spontaneous breathing Cardiovascular status: stable Postop Assessment: no apparent nausea or vomiting Anesthetic complications: no    Last Vitals:  Vitals:   12/27/18 1038 12/27/18 1049  BP: (!) 87/49 112/66  Pulse: 61 (!) 49  Resp: 13 14  Temp: (!) 36.2 C   SpO2: 99% 100%    Last Pain:  Vitals:   12/27/18 1100  TempSrc:   PainSc: 6                  Gurpreet Mariani

## 2018-12-27 NOTE — Transfer of Care (Signed)
Immediate Anesthesia Transfer of Care Note  Patient: Bradley Weber  Procedure(s) Performed: TRANSESOPHAGEAL ECHOCARDIOGRAM (TEE) (N/A )  Patient Location: Endoscopy Unit  Anesthesia Type:MAC  Level of Consciousness: awake, alert  and oriented  Airway & Oxygen Therapy: Patient Spontanous Breathing and Patient connected to nasal cannula oxygen  Post-op Assessment: Report given to RN and Post -op Vital signs reviewed and stable  Post vital signs: Reviewed and stable  Last Vitals:  Vitals Value Taken Time  BP 87/49 12/27/18 1038  Temp    Pulse 54 12/27/18 1038  Resp 13 12/27/18 1038  SpO2 99 % 12/27/18 1038  Vitals shown include unvalidated device data.  Last Pain:  Vitals:   12/27/18 0945  TempSrc: Oral  PainSc: 2       Patients Stated Pain Goal: 2 (54/98/26 4158)  Complications: No apparent anesthesia complications

## 2018-12-27 NOTE — Anesthesia Procedure Notes (Signed)
Procedure Name: MAC Date/Time: 12/27/2018 10:08 AM Performed by: Alain Marion, CRNA Pre-anesthesia Checklist: Patient identified, Emergency Drugs available, Suction available, Patient being monitored and Timeout performed Oxygen Delivery Method: Nasal cannula Placement Confirmation: positive ETCO2

## 2018-12-27 NOTE — Plan of Care (Signed)
  Problem: Activity: Goal: Risk for activity intolerance will decrease Outcome: Progressing   

## 2018-12-27 NOTE — Progress Notes (Addendum)
Patient stable, discussed POC with patient, agreeable with plan, denies question/concerns at this time.  

## 2018-12-27 NOTE — Interval H&P Note (Signed)
History and Physical Interval Note:  12/27/2018 9:57 AM  Bradley Weber  has presented today for surgery, with the diagnosis of bacteremia.  The various methods of treatment have been discussed with the patient and family. After consideration of risks, benefits and other options for treatment, the patient has consented to  Procedure(s): TRANSESOPHAGEAL ECHOCARDIOGRAM (TEE) (N/A) as a surgical intervention.  The patient's history has been reviewed, patient examined, no change in status, stable for surgery.  I have reviewed the patient's chart and labs.  Questions were answered to the patient's satisfaction.     [name]

## 2018-12-27 NOTE — Progress Notes (Signed)
Pt transferred to 3W via wheelchair. Pt transferred with all personal belongings. Pt is stable with VS taken prior to transfer off unit.

## 2018-12-27 NOTE — Progress Notes (Signed)
PROGRESS NOTE  Bradley Weber [id number] DOB: 01-30-1976 DOA: 12/19/2018 PCP: Patient, No Pcp Per  Brief History   Patient is a 43 year old male with history of IV drug abuse, basal carcinoma of forehead and back, who presented with severe low back pain. Last heroin use 2 days before admission. He was also complaining of cough, fever, chills, decreased appetite, weight loss, shortness of breath, nausea and vomiting. Work-up in the emergency department showed lumbar epidural abscess. Chest x-ray was concerning for pneumonia. ID, neurosurgery were consulted. Echocardiogram showed tricuspid valve endocarditis. ID following for antibiotics. The patient underwent TEE earlier today.  Consultants  . Interventional radiology . Infection Disease . Neurosurgery  Procedures  . Drainage of L4, L5 epidural abscess by IR. . TEE  Antibiotics   Anti-infectives (From admission, onward)   Start     Dose/Rate Route Frequency Ordered Stop   12/23/18 1430  nafcillin 2 g in sodium chloride 0.9 % 100 mL IVPB     2 g 200 mL/hr over 30 Minutes Intravenous Every 4 hours 12/23/18 1402 02/13/19 0159   12/23/18 1415  nafcillin injection 2 g  Status:  Discontinued     2 g Intravenous Every 4 hours 12/23/18 1308 12/23/18 1402   12/20/18 1400  ceFAZolin (ANCEF) IVPB 2g/100 mL premix  Status:  Discontinued     2 g 200 mL/hr over 30 Minutes Intravenous Every 8 hours 12/20/18 0752 12/23/18 1308   12/20/18 1200  azithromycin (ZITHROMAX) 500 mg in sodium chloride 0.9 % 250 mL IVPB  Status:  Discontinued     500 mg 250 mL/hr over 60 Minutes Intravenous Every 24 hours 12/19/18 1840 12/20/18 1239   12/20/18 0600  ceFAZolin (ANCEF) IVPB 1 g/50 mL premix  Status:  Discontinued     1 g 100 mL/hr over 30 Minutes Intravenous Every 8 hours 12/20/18 0318 12/20/18 0752   12/20/18 0000  vancomycin (VANCOCIN) IVPB 750 mg/150 ml premix  Status:  Discontinued     750 mg 150 mL/hr over 60 Minutes Intravenous Every 12 hours  12/19/18 1840 12/20/18 0317   12/20/18 0000  cefTRIAXone (ROCEPHIN) 2 g in sodium chloride 0.9 % 100 mL IVPB  Status:  Discontinued     2 g 200 mL/hr over 30 Minutes Intravenous Every 12 hours 12/19/18 1840 12/20/18 0734   12/19/18 1130  vancomycin (VANCOCIN) 1,500 mg in sodium chloride 0.9 % 500 mL IVPB     1,500 mg 250 mL/hr over 120 Minutes Intravenous  Once 12/19/18 1129 12/19/18 1723   12/19/18 1130  cefTRIAXone (ROCEPHIN) 2 g in sodium chloride 0.9 % 100 mL IVPB     2 g 200 mL/hr over 30 Minutes Intravenous  Once 12/19/18 1129 12/19/18 1226   12/19/18 1130  azithromycin (ZITHROMAX) 500 mg in sodium chloride 0.9 % 250 mL IVPB     500 mg 250 mL/hr over 60 Minutes Intravenous  Once 12/19/18 1129 12/19/18 1610     Subjective  The patient is resting comfortably. No new complaints.  Objective   Vitals:  Vitals:   12/27/18 1211 12/27/18 1618  BP: 117/82 113/75  Pulse: 68 70  Resp: 16 16  Temp: (!) 97.5 F (36.4 C) 98 F (36.7 C)  SpO2:  99%    Exam:  Constitutional:  . The patient is awake, alert, and oriented x 3. No acute distress. Respiratory:  . There is no increased work of breathing. . No wheezes, rales, or rhonchi. . No tactile fremitus. Cardiovascular:  . Regular rate  and rhythm. . No murmurs, ectopy, or gallups. . No lateral PMI. No thrills. Abdomen:  . Abdomen is soft, non-tender, non-distended. . No hernias, masses, or organomegaly . Normoactive bowel sounds. Musculoskeletal:  . No cyanosis, clubbing, or edema Skin:  . No rashes, lesions, ulcers . palpation of skin: no induration or nodules Neurologic:  . CN 2-12 intact . Sensation all 4 extremities intact Psychiatric:  . Mental status o Mood, affect appropriate o Orientation to person, place, time  . judgment and insight appear intact    I have personally reviewed the following:   Today's Data  . Vitals, BMP 12/25/2018, Creatinine  Micro Data  . Blood culture positive for Staph aureus.    Scheduled Meds: . enoxaparin (LOVENOX) injection  40 mg Subcutaneous Q24H  . pantoprazole  40 mg Oral Daily  . polyethylene glycol  17 g Oral Daily  . senna-docusate  2 tablet Oral BID  . sodium chloride flush  10-40 mL Intracatheter Q12H  . sodium chloride flush  3 mL Intravenous Q12H   Continuous Infusions: . sodium chloride    . nafcillin IV Stopped (12/27/18 0700)    Principal Problem:   Spinal epidural abscess Active Problems:   Community acquired pneumonia   AKI (acute kidney injury) ([place])   Transaminitis   LOS: 8 days   A & P   Sepsis secondary to MSSA bacteremia/epidural abscess/tricuspid valve endocarditis/community-acquired pneumonia:  Presented with a fever, leukocytosis and evidence for pneumonia. Blood cultures later revealed MSSA and infectious disease consulted. MRI showed 2 epidural abscesses at L4-L5 drained on 12/23/2018 by interventional radiology. Abscess culture grew staph aureus as well. The organism is sensitive to all except tetracycline. He is currently on Nafcillin. He will require 8 weeks of IV therapy. He is not a candidate for outpatient IV antibiotics due to history of IVDA. TEE this morning.  Repeated blood cultures done on 12/21/2018- x3 days.No growth. Continue IV Nafcillin.  Planned course between 6 and 8 weeks. Continue analgesics as needed.  Newly diagnosed tricuspid valve endocarditis: 2D echo done on 12/20/2018 abnormal, moderately thickened, with multiple tricuspid valve vegetation on the anterior leaflet measuring 10 x 20 mm. Cardiology consulted for TEE on 12/24/2018. It was performed earlier today, 12/27/2018. He will require 8 weeks of IV antibiotics. Infectious disease following.  Epidural abscess at L4-L5 post drainage on 12/23/2018 by interventional radiology: Culture has grown out Staph aureus which, like the isolate from blood cultures is sensitive to all except tetracycline. Continue IV nafcilllin and pain management.  Hypokalemia: Resolved.  Monitor.  Hypomagnesemia: Magnesium 1.6 on 12/23/2018. Repleted with 4 g magnesium once.  Polysubstance abuse including IV drug use: Polysubstance abuse cessation counseling done at bedside. UDS positive for opiates and amphetamines.  Positive Hepatitis C screening: Will need GI referral at discharge.  Chronic constipation in the setting of opiate abuse:  Start bowel regimen Senokot 2 tablets twice daily and MiraLAX daily.  Physical debility: PT OT to assess.  I have seen and examined this patient myself. I have spent 32 minutes in his evaluation and care.  DVT prophylaxis:Lovenox Code Status:Full Family Communication:Discussed with the patient. Mother at bedside. Disposition Plan: Patient is currently not appropriate for discharge at this time due to ongoing work-up for newly diagnosed endocarditis, MSSA bacteremia requiring IV antibiotics in the setting of IV drug abuse. Pt is not a candidate for outpatient IV antibiotics due to his history of IVDA.  Betsey Sossamon, DO [facility] Direct contact: see [email or web address]  7PM-7AM contact  night coverage as above 12/27/2018, 6:23 PM  LOS: 7 days

## 2018-12-27 NOTE — Anesthesia Preprocedure Evaluation (Addendum)
Anesthesia Evaluation  Patient identified by MRN, date of birth, ID band Patient awake    Reviewed: Allergy & Precautions, NPO status , Patient's Chart, lab work & pertinent test results  Airway Mallampati: II  TM Distance: >3 FB     Dental   Pulmonary pneumonia, Current Smoker,    breath sounds clear to auscultation       Cardiovascular + dysrhythmias  Rhythm:Irregular Rate:Normal     Neuro/Psych    GI/Hepatic negative GI ROS, Neg liver ROS,   Endo/Other    Renal/GU Renal disease     Musculoskeletal   Abdominal   Peds  Hematology   Anesthesia Other Findings   Reproductive/Obstetrics                            Anesthesia Physical Anesthesia Plan  ASA: III  Anesthesia Plan: General   Post-op Pain Management:    Induction: Intravenous  PONV Risk Score and Plan: 1 and Treatment may vary due to age or medical condition and Propofol infusion  Airway Management Planned: Nasal Cannula and Simple Face Mask  Additional Equipment:   Intra-op Plan:   Post-operative Plan:   Informed Consent: I have reviewed the patients History and Physical, chart, labs and discussed the procedure including the risks, benefits and alternatives for the proposed anesthesia with the patient or authorized representative who has indicated his/her understanding and acceptance.     Dental advisory given  Plan Discussed with: CRNA, Anesthesiologist and Surgeon  Anesthesia Plan Comments:         Anesthesia Quick Evaluation

## 2018-12-28 LAB — AEROBIC/ANAEROBIC CULTURE W GRAM STAIN (SURGICAL/DEEP WOUND): Special Requests: NORMAL

## 2018-12-28 LAB — HEPATITIS C GENOTYPE: HCV Genotype: 3

## 2018-12-28 NOTE — Progress Notes (Signed)
PROGRESS NOTE  Bradley Weber [id number] DOB: 10-06-1975 DOA: 12/19/2018 PCP: Patient, No Pcp Per  Brief History   Patient is a 43 year old male with history of IV drug abuse, basal carcinoma of forehead and back, who presented with severe low back pain. Last heroin use 2 days before admission. He was also complaining of cough, fever, chills, decreased appetite, weight loss, shortness of breath, nausea and vomiting. Work-up in the emergency department showed lumbar epidural abscess. Chest x-ray was concerning for pneumonia. ID, neurosurgery were consulted. Echocardiogram showed tricuspid valve endocarditis. ID following for antibiotics. The patient underwent TEE earlier today it did demonstrate an EF of 55-60%, no intracardiac thrombus, No valvular vegetations were seen. It did demonstrate a small PFO. Consultants  . Interventional radiology . Infection Disease . Neurosurgery  Procedures  . Drainage of L4, L5 epidural abscess by IR. . TEE  Antibiotics   Anti-infectives (From admission, onward)   Start     Dose/Rate Route Frequency Ordered Stop   12/23/18 1430  nafcillin 2 g in sodium chloride 0.9 % 100 mL IVPB     2 g 200 mL/hr over 30 Minutes Intravenous Every 4 hours 12/23/18 1402 02/13/19 0159   12/23/18 1415  nafcillin injection 2 g  Status:  Discontinued     2 g Intravenous Every 4 hours 12/23/18 1308 12/23/18 1402   12/20/18 1400  ceFAZolin (ANCEF) IVPB 2g/100 mL premix  Status:  Discontinued     2 g 200 mL/hr over 30 Minutes Intravenous Every 8 hours 12/20/18 0752 12/23/18 1308   12/20/18 1200  azithromycin (ZITHROMAX) 500 mg in sodium chloride 0.9 % 250 mL IVPB  Status:  Discontinued     500 mg 250 mL/hr over 60 Minutes Intravenous Every 24 hours 12/19/18 1840 12/20/18 1239   12/20/18 0600  ceFAZolin (ANCEF) IVPB 1 g/50 mL premix  Status:  Discontinued     1 g 100 mL/hr over 30 Minutes Intravenous Every 8 hours 12/20/18 0318 12/20/18 0752   12/20/18 0000   vancomycin (VANCOCIN) IVPB 750 mg/150 ml premix  Status:  Discontinued     750 mg 150 mL/hr over 60 Minutes Intravenous Every 12 hours 12/19/18 1840 12/20/18 0317   12/20/18 0000  cefTRIAXone (ROCEPHIN) 2 g in sodium chloride 0.9 % 100 mL IVPB  Status:  Discontinued     2 g 200 mL/hr over 30 Minutes Intravenous Every 12 hours 12/19/18 1840 12/20/18 0734   12/19/18 1130  vancomycin (VANCOCIN) 1,500 mg in sodium chloride 0.9 % 500 mL IVPB     1,500 mg 250 mL/hr over 120 Minutes Intravenous  Once 12/19/18 1129 12/19/18 1723   12/19/18 1130  cefTRIAXone (ROCEPHIN) 2 g in sodium chloride 0.9 % 100 mL IVPB     2 g 200 mL/hr over 30 Minutes Intravenous  Once 12/19/18 1129 12/19/18 1226   12/19/18 1130  azithromycin (ZITHROMAX) 500 mg in sodium chloride 0.9 % 250 mL IVPB     500 mg 250 mL/hr over 60 Minutes Intravenous  Once 12/19/18 1129 12/19/18 1610     Subjective  The patient is resting comfortably. No new complaints.  Objective   Vitals:  Vitals:   12/28/18 0920 12/28/18 1134  BP: 123/85 101/68  Pulse:  68  Resp:  16  Temp:  97.8 F (36.6 C)  SpO2:  100%    Exam:  Constitutional:  . The patient is awake, alert, and oriented x 3. No acute distress. Respiratory:  . There is no increased work of  breathing. . No wheezes, rales, or rhonchi. . No tactile fremitus. Cardiovascular:  . Regular rate and rhythm. . No murmurs, ectopy, or gallups. . No lateral PMI. No thrills. Abdomen:  . Abdomen is soft, non-tender, non-distended. . No hernias, masses, or organomegaly . Normoactive bowel sounds. Musculoskeletal:  . No cyanosis, clubbing, or edema Skin:  . No rashes, lesions, ulcers . palpation of skin: no induration or nodules Neurologic:  . CN 2-12 intact . Sensation all 4 extremities intact Psychiatric:  . Mental status o Mood, affect appropriate o Orientation to person, place, time  . judgment and insight appear intact    I have personally reviewed the following:    Today's Data  . Vitals, BMP 12/25/2018, Creatinine  Micro Data  . Blood culture positive for Staph aureus.   Scheduled Meds: . enoxaparin (LOVENOX) injection  40 mg Subcutaneous Q24H  . pantoprazole  40 mg Oral Daily  . polyethylene glycol  17 g Oral Daily  . senna-docusate  2 tablet Oral BID  . sodium chloride flush  10-40 mL Intracatheter Q12H  . sodium chloride flush  3 mL Intravenous Q12H   Continuous Infusions: . sodium chloride    . nafcillin IV 2 g (12/28/18 1336)    Principal Problem:   Spinal epidural abscess Active Problems:   Community acquired pneumonia   AKI (acute kidney injury) ([place])   Transaminitis   LOS: 9 days   A & P   Sepsis secondary to MSSA bacteremia/epidural abscess/tricuspid valve endocarditis/community-acquired pneumonia:  Presented with a fever, leukocytosis and evidence for pneumonia. Blood cultures later revealed MSSA and infectious disease consulted. MRI showed 2 epidural abscesses at L4-L5 drained on 12/23/2018 by interventional radiology. Abscess culture grew staph aureus as well. The organism is sensitive to all except tetracycline. He is currently on Nafcillin. He will require 8 weeks of IV therapy. He is not a candidate for outpatient IV antibiotics due to history of IVDA. TEE this morning.  Repeated blood cultures done on 12/21/2018- x3 days.No growth. Continue IV Nafcillin.  Planned course between 6 and 8 weeks. Continue analgesics as needed.  Newly diagnosed tricuspid valve endocarditis: 2D echo done on 12/20/2018 abnormal, moderately thickened, with multiple tricuspid valve vegetation on the anterior leaflet measuring 10 x 20 mm. Cardiology consulted for TEE on 12/24/2018. It was performed earlier today, 12/27/2018. He will require 8 weeks of IV antibiotics. Infectious disease following.  Epidural abscess at L4-L5 post drainage on 12/23/2018 by interventional radiology: Culture has grown out Staph aureus which, like the isolate from blood cultures is  sensitive to all except tetracycline. Continue IV nafcilllin and pain management.  Hypokalemia: Resolved. Monitor.  Hypomagnesemia: Magnesium 1.6 on 12/23/2018. Repleted with 4 g magnesium once.  Polysubstance abuse including IV drug use: Polysubstance abuse cessation counseling done at bedside. UDS positive for opiates and amphetamines.  Positive Hepatitis C screening: Will need GI referral at discharge.  Chronic constipation in the setting of opiate abuse:  Start bowel regimen Senokot 2 tablets twice daily and MiraLAX daily.  Physical debility: PT OT to assess.  I have seen and examined this patient myself. I have spent 35 minutes in his evaluation and care.  DVT prophylaxis:Lovenox Code Status:Full Family Communication:Discussed with the patient. Mother at bedside. Disposition Plan: Patient is currently not appropriate for discharge at this time due to ongoing work-up for newly diagnosed endocarditis, MSSA bacteremia requiring IV antibiotics in the setting of IV drug abuse. Pt is not a candidate for outpatient IV antibiotics due to  his history of IVDA.  Cristie Mckinney, DO [facility] Direct contact: see [email or web address]  7PM-7AM contact night coverage as above 12/28/2018, 4:50 PM  LOS: 7 days

## 2018-12-29 ENCOUNTER — Encounter (HOSPITAL_COMMUNITY): Payer: Self-pay | Admitting: Cardiology

## 2018-12-29 NOTE — Progress Notes (Signed)
PROGRESS NOTE  Bradley Weber [id number] DOB: 1975/04/29 DOA: 12/19/2018 PCP: Patient, No Pcp Per  Brief History   Patient is a 43 year old male with history of IV drug abuse, basal carcinoma of forehead and back, who presented with severe low back pain. Last heroin use 2 days before admission. He was also complaining of cough, fever, chills, decreased appetite, weight loss, shortness of breath, nausea and vomiting. Work-up in the emergency department showed lumbar epidural abscess. Chest x-ray was concerning for pneumonia. ID, neurosurgery were consulted. Echocardiogram showed tricuspid valve endocarditis. ID following for antibiotics. The patient underwent TEE earlier today it did demonstrate an EF of 55-60%, no intracardiac thrombus, No valvular vegetations were seen. It did demonstrate a small PFO. Consultants  . Interventional radiology . Infection Disease . Neurosurgery  Procedures  . Drainage of L4, L5 epidural abscess by IR. . TEE  Antibiotics   Anti-infectives (From admission, onward)   Start     Dose/Rate Route Frequency Ordered Stop   12/23/18 1430  nafcillin 2 g in sodium chloride 0.9 % 100 mL IVPB     2 g 200 mL/hr over 30 Minutes Intravenous Every 4 hours 12/23/18 1402 02/13/19 0159   12/23/18 1415  nafcillin injection 2 g  Status:  Discontinued     2 g Intravenous Every 4 hours 12/23/18 1308 12/23/18 1402   12/20/18 1400  ceFAZolin (ANCEF) IVPB 2g/100 mL premix  Status:  Discontinued     2 g 200 mL/hr over 30 Minutes Intravenous Every 8 hours 12/20/18 0752 12/23/18 1308   12/20/18 1200  azithromycin (ZITHROMAX) 500 mg in sodium chloride 0.9 % 250 mL IVPB  Status:  Discontinued     500 mg 250 mL/hr over 60 Minutes Intravenous Every 24 hours 12/19/18 1840 12/20/18 1239   12/20/18 0600  ceFAZolin (ANCEF) IVPB 1 g/50 mL premix  Status:  Discontinued     1 g 100 mL/hr over 30 Minutes Intravenous Every 8 hours 12/20/18 0318 12/20/18 0752   12/20/18 0000   vancomycin (VANCOCIN) IVPB 750 mg/150 ml premix  Status:  Discontinued     750 mg 150 mL/hr over 60 Minutes Intravenous Every 12 hours 12/19/18 1840 12/20/18 0317   12/20/18 0000  cefTRIAXone (ROCEPHIN) 2 g in sodium chloride 0.9 % 100 mL IVPB  Status:  Discontinued     2 g 200 mL/hr over 30 Minutes Intravenous Every 12 hours 12/19/18 1840 12/20/18 0734   12/19/18 1130  vancomycin (VANCOCIN) 1,500 mg in sodium chloride 0.9 % 500 mL IVPB     1,500 mg 250 mL/hr over 120 Minutes Intravenous  Once 12/19/18 1129 12/19/18 1723   12/19/18 1130  cefTRIAXone (ROCEPHIN) 2 g in sodium chloride 0.9 % 100 mL IVPB     2 g 200 mL/hr over 30 Minutes Intravenous  Once 12/19/18 1129 12/19/18 1226   12/19/18 1130  azithromycin (ZITHROMAX) 500 mg in sodium chloride 0.9 % 250 mL IVPB     500 mg 250 mL/hr over 60 Minutes Intravenous  Once 12/19/18 1129 12/19/18 1610     Subjective  The patient is resting comfortably. No new complaints.  Objective   Vitals:  Vitals:   12/28/18 2300 12/29/18 0332  BP: 121/85 114/76  Pulse: 82 67  Resp: 16 14  Temp: 98.9 F (37.2 C) 98.4 F (36.9 C)  SpO2: 100% 100%    Exam:  Constitutional:  . The patient is awake, alert, and oriented x 3. No acute distress. Respiratory:  . There is no  increased work of breathing. . No wheezes, rales, or rhonchi. . No tactile fremitus. Cardiovascular:  . Regular rate and rhythm. . No murmurs, ectopy, or gallups. . No lateral PMI. No thrills. Abdomen:  . Abdomen is soft, non-tender, non-distended. . No hernias, masses, or organomegaly . Normoactive bowel sounds. Musculoskeletal:  . No cyanosis, clubbing, or edema Skin:  . No rashes, lesions, ulcers . palpation of skin: no induration or nodules Neurologic:  . CN 2-12 intact . Sensation all 4 extremities intact Psychiatric:  . Mental status o Mood, affect appropriate o Orientation to person, place, time  . judgment and insight appear intact    I have personally  reviewed the following:   Today's Data  . Vitals, BMP 12/25/2018, Creatinine  Micro Data  . Blood culture positive for Staph aureus.   Scheduled Meds: . enoxaparin (LOVENOX) injection  40 mg Subcutaneous Q24H  . pantoprazole  40 mg Oral Daily  . polyethylene glycol  17 g Oral Daily  . senna-docusate  2 tablet Oral BID  . sodium chloride flush  10-40 mL Intracatheter Q12H  . sodium chloride flush  3 mL Intravenous Q12H   Continuous Infusions: . sodium chloride    . nafcillin IV 2 g (12/29/18 1215)    Principal Problem:   Spinal epidural abscess Active Problems:   Community acquired pneumonia   AKI (acute kidney injury) ([place])   Transaminitis   LOS: 10 days   A & P   Sepsis secondary to MSSA bacteremia/epidural abscess/tricuspid valve endocarditis/community-acquired pneumonia:  Presented with a fever, leukocytosis and evidence for pneumonia. Blood cultures later revealed MSSA and infectious disease consulted. MRI showed 2 epidural abscesses at L4-L5 drained on 12/23/2018 by interventional radiology. Abscess culture grew staph aureus as well. The organism is sensitive to all except tetracycline. He is currently on Nafcillin. He will require 8 weeks of IV therapy. He is not a candidate for outpatient IV antibiotics due to history of IVDA. TEE  On 12/27/2018 did not demonstrate vegetations on tricuspid valve.  Repeated blood cultures done on 12/21/2018- x3 days.No growth. Continue IV Nafcillin.  Planned course between 6 and 8 weeks. Continue analgesics as needed.  Newly diagnosed tricuspid valve endocarditis: 2D echo done on 12/20/2018 abnormal, moderately thickened, with multiple tricuspid valve vegetation on the anterior leaflet measuring 10 x 20 mm. Cardiology consulted for TEE on 12/24/2018. It was performed earlier today, 12/27/2018. He will require 8 weeks of IV antibiotics. Infectious disease following.  Epidural abscess at L4-L5 post drainage on 12/23/2018 by interventional radiology:  Culture has grown out Staph aureus which, like the isolate from blood cultures is sensitive to all except tetracycline. Continue IV nafcilllin and pain management.  Hypokalemia: Resolved. Monitor.  Hypomagnesemia: Magnesium 1.6 on 12/23/2018. Repleted with 4 g magnesium once.  Polysubstance abuse including IV drug use: Polysubstance abuse cessation counseling done at bedside. UDS positive for opiates and amphetamines.  Positive Hepatitis C screening: Will need GI referral at discharge.  Chronic constipation in the setting of opiate abuse:  Start bowel regimen Senokot 2 tablets twice daily and MiraLAX daily.  Physical debility: PT OT to assess.  I have seen and examined this patient myself. I have spent 32 minutes in his evaluation and care.  DVT prophylaxis:Lovenox Code Status:Full Family Communication:Discussed with the patient. Mother at bedside. Disposition Plan: Patient is currently not appropriate for discharge at this time due to ongoing work-up for newly diagnosed endocarditis, MSSA bacteremia requiring IV antibiotics in the setting of IV drug abuse.  Pt is not a candidate for outpatient IV antibiotics due to his history of IVDA.  Kambri Dismore, DO [facility] Direct contact: see [email or web address]  7PM-7AM contact night coverage as above 12/29/2018, 3:24 PM  LOS: 7 days

## 2018-12-30 LAB — CULTURE, BLOOD (ROUTINE X 2)
Culture: NO GROWTH
Culture: NO GROWTH
Special Requests: ADEQUATE
Special Requests: ADEQUATE

## 2018-12-30 NOTE — Progress Notes (Signed)
PROGRESS NOTE  Bradley Weber [id number] DOB: 1975-10-24 DOA: 12/19/2018 PCP: Patient, No Pcp Per  Brief History   Patient is a 43 year old male with history of IV drug abuse, basal carcinoma of forehead and back, who presented with severe low back pain. Last heroin use 2 days before admission. He was also complaining of cough, fever, chills, decreased appetite, weight loss, shortness of breath, nausea and vomiting. Work-up in the emergency department showed lumbar epidural abscess. Chest x-ray was concerning for pneumonia. ID, neurosurgery were consulted. Echocardiogram showed tricuspid valve endocarditis. ID following for antibiotics. The patient underwent TEE earlier today it did demonstrate an EF of 55-60%, no intracardiac thrombus, No valvular vegetations were seen. It did demonstrate a small PFO. Consultants  . Interventional radiology . Infection Disease . Neurosurgery  Procedures  . Drainage of L4, L5 epidural abscess by IR. . TEE  Antibiotics   Anti-infectives (From admission, onward)   Start     Dose/Rate Route Frequency Ordered Stop   12/23/18 1430  nafcillin 2 g in sodium chloride 0.9 % 100 mL IVPB     2 g 200 mL/hr over 30 Minutes Intravenous Every 4 hours 12/23/18 1402 02/13/19 0159   12/23/18 1415  nafcillin injection 2 g  Status:  Discontinued     2 g Intravenous Every 4 hours 12/23/18 1308 12/23/18 1402   12/20/18 1400  ceFAZolin (ANCEF) IVPB 2g/100 mL premix  Status:  Discontinued     2 g 200 mL/hr over 30 Minutes Intravenous Every 8 hours 12/20/18 0752 12/23/18 1308   12/20/18 1200  azithromycin (ZITHROMAX) 500 mg in sodium chloride 0.9 % 250 mL IVPB  Status:  Discontinued     500 mg 250 mL/hr over 60 Minutes Intravenous Every 24 hours 12/19/18 1840 12/20/18 1239   12/20/18 0600  ceFAZolin (ANCEF) IVPB 1 g/50 mL premix  Status:  Discontinued     1 g 100 mL/hr over 30 Minutes Intravenous Every 8 hours 12/20/18 0318 12/20/18 0752   12/20/18 0000   vancomycin (VANCOCIN) IVPB 750 mg/150 ml premix  Status:  Discontinued     750 mg 150 mL/hr over 60 Minutes Intravenous Every 12 hours 12/19/18 1840 12/20/18 0317   12/20/18 0000  cefTRIAXone (ROCEPHIN) 2 g in sodium chloride 0.9 % 100 mL IVPB  Status:  Discontinued     2 g 200 mL/hr over 30 Minutes Intravenous Every 12 hours 12/19/18 1840 12/20/18 0734   12/19/18 1130  vancomycin (VANCOCIN) 1,500 mg in sodium chloride 0.9 % 500 mL IVPB     1,500 mg 250 mL/hr over 120 Minutes Intravenous  Once 12/19/18 1129 12/19/18 1723   12/19/18 1130  cefTRIAXone (ROCEPHIN) 2 g in sodium chloride 0.9 % 100 mL IVPB     2 g 200 mL/hr over 30 Minutes Intravenous  Once 12/19/18 1129 12/19/18 1226   12/19/18 1130  azithromycin (ZITHROMAX) 500 mg in sodium chloride 0.9 % 250 mL IVPB     500 mg 250 mL/hr over 60 Minutes Intravenous  Once 12/19/18 1129 12/19/18 1610     Subjective  The patient is resting comfortably. No new complaints.  Objective   Vitals:  Vitals:   12/30/18 0700 12/30/18 1120  BP: 136/85 117/81  Pulse: 69 (!) 105  Resp: 16   Temp: 98.1 F (36.7 C) 98.2 F (36.8 C)  SpO2: 100% 100%   Exam:  Constitutional:  . The patient is awake, alert, and oriented x 3. No acute distress. Respiratory:  . There is no  increased work of breathing. . No wheezes, rales, or rhonchi. . No tactile fremitus. Cardiovascular:  . Regular rate and rhythm. . No murmurs, ectopy, or gallups. . No lateral PMI. No thrills. Abdomen:  . Abdomen is soft, non-tender, non-distended. . No hernias, masses, or organomegaly . Normoactive bowel sounds. Musculoskeletal:  . No cyanosis, clubbing, or edema Skin:  . No rashes, lesions, ulcers . palpation of skin: no induration or nodules Neurologic:  . CN 2-12 intact . Sensation all 4 extremities intact Psychiatric:  . Mental status o Mood, affect appropriate o Orientation to person, place, time  . judgment and insight appear intact    I have personally  reviewed the following:   Today's Data  . Vitals, BMP 12/25/2018, Creatinine  Micro Data  . Blood culture positive for Methicillin Sensitive Staph aureus.  Scheduled Meds: . enoxaparin (LOVENOX) injection  40 mg Subcutaneous Q24H  . pantoprazole  40 mg Oral Daily  . polyethylene glycol  17 g Oral Daily  . senna-docusate  2 tablet Oral BID  . sodium chloride flush  10-40 mL Intracatheter Q12H  . sodium chloride flush  3 mL Intravenous Q12H   Continuous Infusions: . sodium chloride    . nafcillin IV 2 g (12/30/18 1510)    Principal Problem:   Spinal epidural abscess Active Problems:   Community acquired pneumonia   AKI (acute kidney injury) ([place])   Transaminitis   LOS: 11 days   A & P   Sepsis secondary to MSSA bacteremia/epidural abscess/tricuspid valve endocarditis/community-acquired pneumonia:  Presented with a fever, leukocytosis and evidence for pneumonia. Blood cultures later revealed MSSA and infectious disease consulted. MRI showed 2 epidural abscesses at L4-L5 drained on 12/23/2018 by interventional radiology. Abscess culture grew staph aureus as well. The organism is sensitive to all except tetracycline. He is currently on Nafcillin. He will require 8 weeks of IV therapy. He is not a candidate for outpatient IV antibiotics due to history of IVDA. TEE on 12/27/2018 did not demonstrate vegetations on tricuspid valve.  Repeated blood cultures done on 12/21/2018.No growth. Continue IV Nafcillin. Planned course between 6 and 8 weeks. Continue analgesics as needed.  Newly diagnosed tricuspid valve endocarditis: 2D echo done on 12/20/2018 abnormal, moderately thickened, with multiple tricuspid valve vegetation on the anterior leaflet measuring 10 x 20 mm. Cardiology consulted for TEE on 12/24/2018. It was performed earlier today, 12/27/2018. He will require 8 weeks of IV antibiotics. Infectious disease following.  Epidural abscess at L4-L5 post drainage on 12/23/2018 by interventional  radiology: Culture has grown out Staph aureus which, like the isolate from blood cultures is sensitive to all except tetracycline. Continue IV nafcilllin and pain management.  Hypokalemia: Resolved. Monitor.  Hypomagnesemia: Magnesium 1.6 on 12/23/2018. Repleted with 4 g magnesium once.  Polysubstance abuse including IV drug use: Polysubstance abuse cessation counseling done at bedside. UDS positive for opiates and amphetamines.  Positive Hepatitis C screening: Will need GI referral at discharge.  Chronic constipation in the setting of opiate abuse:  Start bowel regimen Senokot 2 tablets twice daily and MiraLAX daily.  Physical debility: PT OT to assess.  I have seen and examined this patient myself. I have spent 30 minutes in his evaluation and care.  DVT prophylaxis:Lovenox Code Status:Full Family Communication:Discussed with the patient. Mother at bedside. Disposition Plan: Patient is currently not appropriate for discharge at this time due to ongoing work-up for newly diagnosed endocarditis, MSSA bacteremia requiring IV antibiotics in the setting of IV drug abuse. Pt is not  a candidate for outpatient IV antibiotics due to his history of IVDA.  Edona Schreffler, DO [facility] Direct contact: see [email or web address]  7PM-7AM contact night coverage as above 12/30/2018, 3:15 PM  LOS: 7 days

## 2018-12-31 LAB — CBC WITH DIFFERENTIAL/PLATELET
Abs Immature Granulocytes: 0.02 10*3/uL (ref 0.00–0.07)
Basophils Absolute: 0 10*3/uL (ref 0.0–0.1)
Basophils Relative: 1 %
Eosinophils Absolute: 0.3 10*3/uL (ref 0.0–0.5)
Eosinophils Relative: 4 %
HCT: 39.6 % (ref 39.0–52.0)
Hemoglobin: 13.2 g/dL (ref 13.0–17.0)
Immature Granulocytes: 0 %
Lymphocytes Relative: 34 %
Lymphs Abs: 2.7 10*3/uL (ref 0.7–4.0)
MCH: 28.6 pg (ref 26.0–34.0)
MCHC: 33.3 g/dL (ref 30.0–36.0)
MCV: 85.9 fL (ref 80.0–100.0)
Monocytes Absolute: 0.6 10*3/uL (ref 0.1–1.0)
Monocytes Relative: 7 %
Neutro Abs: 4.2 10*3/uL (ref 1.7–7.7)
Neutrophils Relative %: 54 %
Platelets: 650 10*3/uL — ABNORMAL HIGH (ref 150–400)
RBC: 4.61 MIL/uL (ref 4.22–5.81)
RDW: 13.3 % (ref 11.5–15.5)
WBC: 7.9 10*3/uL (ref 4.0–10.5)
nRBC: 0 % (ref 0.0–0.2)

## 2018-12-31 LAB — BASIC METABOLIC PANEL
Anion gap: 11 (ref 5–15)
BUN: 11 mg/dL (ref 6–20)
CO2: 24 mmol/L (ref 22–32)
Calcium: 9.2 mg/dL (ref 8.9–10.3)
Chloride: 101 mmol/L (ref 98–111)
Creatinine, Ser: 0.64 mg/dL (ref 0.61–1.24)
GFR calc Af Amer: >60 mL/min (ref >60)
GFR calc non Af Amer: >60 mL/min (ref >60)
Glucose, Bld: 51 mg/dL — ABNORMAL LOW (ref 70–99)
Potassium: 4 mmol/L (ref 3.5–5.1)
Sodium: 136 mmol/L (ref 135–145)

## 2018-12-31 NOTE — Progress Notes (Signed)
Bradley Weber [id number] DOB: Aug 01, 1975 DOA: 12/19/2018 PCP: Patient, No Pcp Per  Brief History   Patient is a 43 year old male with history of IV drug abuse, basal carcinoma of forehead and back, who presented with severe low back pain. Last heroin use 2 days before admission. He was also complaining of cough, fever, chills, decreased appetite, weight loss, shortness of breath, nausea and vomiting. Work-up in the emergency department showed lumbar epidural abscess. Chest x-ray was concerning for pneumonia. ID, neurosurgery were consulted. Echocardiogram showed tricuspid valve endocarditis. ID following for antibiotics. The patient underwent TEE earlier today it did demonstrate an EF of 55-60%, no intracardiac thrombus, No valvular vegetations were seen. It did demonstrate a small PFO. Consultants  . Interventional radiology . Infection Disease . Neurosurgery  Procedures  . Drainage of L4, L5 epidural abscess by IR. . TEE  Antibiotics   Anti-infectives (From admission, onward)   Start     Dose/Rate Route Frequency Ordered Stop   12/23/18 1430  nafcillin 2 g in sodium chloride 0.9 % 100 mL IVPB     2 g 200 mL/hr over 30 Minutes Intravenous Every 4 hours 12/23/18 1402 02/13/19 0159   12/23/18 1415  nafcillin injection 2 g  Status:  Discontinued     2 g Intravenous Every 4 hours 12/23/18 1308 12/23/18 1402   12/20/18 1400  ceFAZolin (ANCEF) IVPB 2g/100 mL premix  Status:  Discontinued     2 g 200 mL/hr over 30 Minutes Intravenous Every 8 hours 12/20/18 0752 12/23/18 1308   12/20/18 1200  azithromycin (ZITHROMAX) 500 mg in sodium chloride 0.9 % 250 mL IVPB  Status:  Discontinued     500 mg 250 mL/hr over 60 Minutes Intravenous Every 24 hours 12/19/18 1840 12/20/18 1239   12/20/18 0600  ceFAZolin (ANCEF) IVPB 1 g/50 mL premix  Status:  Discontinued     1 g 100 mL/hr over 30 Minutes Intravenous Every 8 hours 12/20/18 0318 12/20/18 0752   12/20/18 0000   vancomycin (VANCOCIN) IVPB 750 mg/150 ml premix  Status:  Discontinued     750 mg 150 mL/hr over 60 Minutes Intravenous Every 12 hours 12/19/18 1840 12/20/18 0317   12/20/18 0000  cefTRIAXone (ROCEPHIN) 2 g in sodium chloride 0.9 % 100 mL IVPB  Status:  Discontinued     2 g 200 mL/hr over 30 Minutes Intravenous Every 12 hours 12/19/18 1840 12/20/18 0734   12/19/18 1130  vancomycin (VANCOCIN) 1,500 mg in sodium chloride 0.9 % 500 mL IVPB     1,500 mg 250 mL/hr over 120 Minutes Intravenous  Once 12/19/18 1129 12/19/18 1723   12/19/18 1130  cefTRIAXone (ROCEPHIN) 2 g in sodium chloride 0.9 % 100 mL IVPB     2 g 200 mL/hr over 30 Minutes Intravenous  Once 12/19/18 1129 12/19/18 1226   12/19/18 1130  azithromycin (ZITHROMAX) 500 mg in sodium chloride 0.9 % 250 mL IVPB     500 mg 250 mL/hr over 60 Minutes Intravenous  Once 12/19/18 1129 12/19/18 1610     Subjective  The patient is resting comfortably. No new complaints.  Objective   Vitals:  Vitals:   12/31/18 0831 12/31/18 1156  BP: 103/65 130/87  Pulse: 80 91  Resp:    Temp: 98.3 F (36.8 C) 98 F (36.7 C)  SpO2: 94% 100%   Exam:  Constitutional:  . The patient is awake, alert, and oriented x 3. No acute distress. Respiratory:  . There is no increased  work of breathing. . No wheezes, rales, or rhonchi. . No tactile fremitus. Cardiovascular:  . Regular rate and rhythm. . No murmurs, ectopy, or gallups. . No lateral PMI. No thrills. Abdomen:  . Abdomen is soft, non-tender, non-distended. . No hernias, masses, or organomegaly . Normoactive bowel sounds. Musculoskeletal:  . No cyanosis, clubbing, or edema Skin:  . No rashes, lesions, ulcers . palpation of skin: no induration or nodules Neurologic:  . CN 2-12 intact . Sensation all 4 extremities intact Psychiatric:  . Mental status o Mood, affect appropriate o Orientation to person, place, time  . judgment and insight appear intact    I have personally reviewed  the following:   Today's Data  . Vitals, BMP 12/25/2018, Creatinine  Micro Data  . Blood culture positive for Methicillin Sensitive Staph aureus.  Scheduled Meds: . enoxaparin (LOVENOX) injection  40 mg Subcutaneous Q24H  . pantoprazole  40 mg Oral Daily  . polyethylene glycol  17 g Oral Daily  . senna-docusate  2 tablet Oral BID  . sodium chloride flush  10-40 mL Intracatheter Q12H  . sodium chloride flush  3 mL Intravenous Q12H   Continuous Infusions: . sodium chloride    . nafcillin IV 2 g (12/31/18 1602)    Principal Problem:   Spinal epidural abscess Active Problems:   Community acquired pneumonia   AKI (acute kidney injury) ([place])   Transaminitis   LOS: 12 days   A & P   Sepsis secondary to MSSA bacteremia/epidural abscess/tricuspid valve endocarditis/community-acquired pneumonia:  Presented with a fever, leukocytosis and evidence for pneumonia. Blood cultures later revealed MSSA and infectious disease consulted. MRI showed 2 epidural abscesses at L4-L5 drained on 12/23/2018 by interventional radiology. Abscess culture grew staph aureus as well. The organism is sensitive to all except tetracycline. He is currently on Nafcillin. He will require 8 weeks of IV therapy. He is not a candidate for outpatient IV antibiotics due to history of IVDA. TEE on 12/27/2018 did not demonstrate vegetations on tricuspid valve.  Repeated blood cultures done on 12/21/2018.No growth. Continue IV Nafcillin. Planned course between 6 and 8 weeks. Continue analgesics as needed.  Newly diagnosed tricuspid valve endocarditis: 2D echo done on 12/20/2018 abnormal, moderately thickened, with multiple tricuspid valve vegetation on the anterior leaflet measuring 10 x 20 mm. Cardiology consulted for TEE on 12/24/2018. It was performed earlier today, 12/27/2018. He will require 8 weeks of IV antibiotics. Infectious disease following.  Epidural abscess at L4-L5 post drainage on 12/23/2018 by interventional radiology:  Culture has grown out Staph aureus which, like the isolate from blood cultures is sensitive to all except tetracycline. Continue IV nafcilllin and pain management.  Hypokalemia: Resolved. Monitor.  Hypomagnesemia: Magnesium 1.6 on 12/23/2018. Repleted with 4 g magnesium once.  Polysubstance abuse including IV drug use: Polysubstance abuse cessation counseling done at bedside. UDS positive for opiates and amphetamines.  Positive Hepatitis C screening: Will need GI referral at discharge.  Chronic constipation in the setting of opiate abuse:  Start bowel regimen Senokot 2 tablets twice daily and MiraLAX daily.  Physical debility: PT OT to assess.  I have seen and examined this patient myself. I have spent 30 minutes in his evaluation and care.  DVT prophylaxis:Lovenox Code Status:Full Family Communication:Discussed with the patient. Mother at bedside. Disposition Plan: Patient is currently not appropriate for discharge at this time due to ongoing work-up for newly diagnosed endocarditis, MSSA bacteremia requiring IV antibiotics in the setting of IV drug abuse. Pt is not a  candidate for outpatient IV antibiotics due to his history of IVDA.  Jakala Herford, DO [facility] Direct contact: see [email or web address]  7PM-7AM contact night coverage as above 12/31/2018, 4:15 PM  LOS: 7 days

## 2019-01-01 NOTE — Progress Notes (Signed)
PROGRESS NOTE    Bradley Weber  [id number] DOB: May 18, 1975 DOA: 12/19/2018 PCP: Patient, No Pcp Per    Brief Narrative:  Patient is a 43 year old male with history of IV drug abuse, basal carcinoma of forehead and back, who presented with severe low back pain. Last heroin use 2 days before admission. He was also complaining of cough, fever, chills, decreased appetite, weight loss, shortness of breath, nausea and vomiting. Work-up in the emergency department showed lumbar epidural abscess. Chest x-ray was concerning for pneumonia. ID, neurosurgery were consulted. Echocardiogram showed tricuspid valve endocarditis. ID following for antibiotics. The patient underwent TEE and it did demonstrate an EF of 55-60%, no intracardiac thrombus, No valvular vegetations were seen. It did demonstrate a small PFO.   Assessment & Plan:   Principal Problem:   Spinal epidural abscess Active Problems:   Community acquired pneumonia   AKI (acute kidney injury) ([place])   Transaminitis  Sepsis secondary to MSSA bacteremia, epidural abscess, tricuspid valve endocarditis, pneumonia with septic emboli: Blood cultures 12/19/2018 MSSA Blood cultures 12/21/2018 no growth Currently on IV nafcillin as recommended by infectious disease until 02/17/2019, patient unable to have outpatient IV therapies because of ongoing drug use.  Polysubstance abuse/injectable drug use: Counseling done.  Currently without any evidence of withdrawals.  Positive hep C screening: Will refer to GI on discharge.  DVT prophylaxis: Lovenox subcu Code Status: Full code Family Communication: None Disposition Plan: Stays in the hospital, not a candidate for outpatient IV therapies   Consultants:   Interventional radiology  Infectious disease  Procedures:   Drains of L4/L5 epidural abscess by IR  TEE  Antimicrobials:   Nafcillin 12/23/2018 ongoing   Subjective: Patient seen and examined.  No overnight events.  Denies any  complaints.  He does not have any back pain nowadays.  He was wondering why he has to stay in the hospital for more than a month.  Objective: Vitals:   12/31/18 1941 01/01/19 0113 01/01/19 0409 01/01/19 0815  BP: 114/72 114/75 112/60 (!) 100/59  Pulse: 90 79 62 62  Resp: 18 18 18 16   Temp: 98.6 F (37 C) 97.7 F (36.5 C) 98.4 F (36.9 C) 98.5 F (36.9 C)  TempSrc: Oral Oral Oral Oral  SpO2: 99% 100% 99% 98%  Weight:      Height:        Intake/Output Summary (Last 24 hours) at 01/01/2019 1417 Last data filed at 12/31/2018 1500 Gross per 24 hour  Intake 200 ml  Output -  Net 200 ml   Filed Weights   12/19/18 0518 12/19/18 2036  Weight: 79.4 kg 76.1 kg    Examination:  General exam: Appears calm and comfortable  Respiratory system: Clear to auscultation. Respiratory effort normal. Cardiovascular system: S1 & S2 heard, RRR. No JVD, murmurs, rubs, gallops or clicks. No pedal edema. Gastrointestinal system: Abdomen is nondistended, soft and nontender. No organomegaly or masses felt. Normal bowel sounds heard. Central nervous system: Alert and oriented. No focal neurological deficits. Extremities: Symmetric 5 x 5 power. Skin: No rashes, lesions or ulcers Psychiatry: Judgement and insight appear normal. Mood & affect appropriate.     Data Reviewed: I have personally reviewed following labs and imaging studies  CBC: Recent Labs  Lab 12/27/18 0453 12/31/18 1205  WBC 6.6 7.9  NEUTROABS 3.1 4.2  HGB 12.5* 13.2  HCT 37.3* 39.6  MCV 85.0 85.9  PLT 367 [id number]*   Basic Metabolic Panel: Recent Labs  Lab 12/26/18 0238 12/27/18 0453 12/31/18 1205  NA  --  136 136  K  --  3.6 4.0  CL  --  101 101  CO2  --  27 24  GLUCOSE  --  101* 51*  BUN  --  10 11  CREATININE 0.61 0.71 0.64  CALCIUM  --  9.0 9.2   GFR: Estimated Creatinine Clearance: 126.8 mL/min (by C-G formula based on SCr of 0.64 mg/dL). Liver Function Tests: Recent Labs  Lab 12/27/18 0453  AST 34  ALT 41   ALKPHOS 87  BILITOT 0.9  PROT 6.9  ALBUMIN 2.6*   No results for input(s): LIPASE, AMYLASE in the last 168 hours. No results for input(s): AMMONIA in the last 168 hours. Coagulation Profile: No results for input(s): INR, PROTIME in the last 168 hours. Cardiac Enzymes: No results for input(s): CKTOTAL, CKMB, CKMBINDEX, TROPONINI in the last 168 hours. BNP (last 3 results) No results for input(s): PROBNP in the last 8760 hours. HbA1C: No results for input(s): HGBA1C in the last 72 hours. CBG: No results for input(s): GLUCAP in the last 168 hours. Lipid Profile: No results for input(s): CHOL, HDL, LDLCALC, TRIG, CHOLHDL, LDLDIRECT in the last 72 hours. Thyroid Function Tests: No results for input(s): TSH, T4TOTAL, FREET4, T3FREE, THYROIDAB in the last 72 hours. Anemia Panel: No results for input(s): VITAMINB12, FOLATE, FERRITIN, TIBC, IRON, RETICCTPCT in the last 72 hours. Sepsis Labs: No results for input(s): PROCALCITON, LATICACIDVEN in the last 168 hours.  Recent Results (from the past 240 hour(s))  Aerobic/Anaerobic Culture (surgical/deep wound)     Status: None   Collection Time: 12/23/18 11:25 AM   Specimen: Abscess  Result Value Ref Range Status   Specimen Description   Final    ABSCESS LAVAGE ASP RT L4 L5 FACET Performed at [facility], [address], [place], [place] [address]    Special Requests Normal  Final   Gram Stain   Final    FEW WBC PRESENT, PREDOMINANTLY PMN NO ORGANISMS SEEN    Culture   Final    FEW STAPHYLOCOCCUS AUREUS NO ANAEROBES ISOLATED Performed at [facility], [address] [place], [place] [address]    Report Status 12/28/2018 FINAL  Final   Organism ID, Bacteria STAPHYLOCOCCUS AUREUS  Final      Susceptibility   Staphylococcus aureus - MIC*    CIPROFLOXACIN <=0.5 SENSITIVE Sensitive     ERYTHROMYCIN <=0.25 SENSITIVE Sensitive     GENTAMICIN <=0.5 SENSITIVE Sensitive     OXACILLIN 0.5 SENSITIVE Sensitive      TETRACYCLINE >=16 RESISTANT Resistant     VANCOMYCIN 1 SENSITIVE Sensitive     TRIMETH/SULFA <=10 SENSITIVE Sensitive     CLINDAMYCIN <=0.25 SENSITIVE Sensitive     RIFAMPIN <=0.5 SENSITIVE Sensitive     Inducible Clindamycin NEGATIVE Sensitive     * FEW STAPHYLOCOCCUS AUREUS         Radiology Studies: No results found.      Scheduled Meds: . enoxaparin (LOVENOX) injection  40 mg Subcutaneous Q24H  . pantoprazole  40 mg Oral Daily  . polyethylene glycol  17 g Oral Daily  . senna-docusate  2 tablet Oral BID  . sodium chloride flush  10-40 mL Intracatheter Q12H  . sodium chloride flush  3 mL Intravenous Q12H   Continuous Infusions: . sodium chloride    . nafcillin IV 2 g (01/01/19 1019)     LOS: 13 days    Time spent: 25 minutes    Barb Merino, MD [facility]  Pager [phone]  If 7PM-7AM, please contact night-coverage [email or web address] Password [email or web address] 01/01/2019, 2:17 PM

## 2019-01-02 LAB — CREATININE, SERUM
Creatinine, Ser: 0.66 mg/dL (ref 0.61–1.24)
GFR calc Af Amer: >60 mL/min (ref >60)
GFR calc non Af Amer: >60 mL/min (ref >60)

## 2019-01-02 NOTE — Progress Notes (Signed)
PROGRESS NOTE    Bradley Weber  [id number] DOB: 12-11-1975 DOA: 12/19/2018 PCP: Patient, No Pcp Per    Brief Narrative:  Patient is a 43 year old male with history of IV drug abuse, basal carcinoma of forehead and back, who presented with severe low back pain. Last heroin use 2 days before admission. He was also complaining of cough, fever, chills, decreased appetite, weight loss, shortness of breath, nausea and vomiting. Work-up in the emergency department showed lumbar epidural abscess. Chest x-ray was concerning for pneumonia. ID, neurosurgery were consulted. Echocardiogram showed tricuspid valve endocarditis. ID following for antibiotics. The patient underwent TEE and it did demonstrate an EF of 55-60%, no intracardiac thrombus, No valvular vegetations were seen. It did demonstrate a small PFO.   Assessment & Plan:   Principal Problem:   Spinal epidural abscess Active Problems:   Community acquired pneumonia   AKI (acute kidney injury) ([place])   Transaminitis  Sepsis secondary to MSSA bacteremia, epidural abscess, tricuspid valve endocarditis, pneumonia with septic emboli: Blood cultures 12/19/2018 MSSA Blood cultures 12/21/2018 no growth Currently on IV nafcillin as recommended by infectious disease until 02/17/2019, patient unable to have outpatient IV therapies because of ongoing drug use.  Polysubstance abuse/injectable drug use: Counseling done.  Currently without any evidence of withdrawals.  Positive hep C screening: Will refer to GI on discharge.  DVT prophylaxis: Lovenox subcu Code Status: Full code Family Communication: None Disposition Plan: Stays in the hospital, not a candidate for outpatient IV therapies   Consultants:   Interventional radiology  Infectious disease  Procedures:   Drains of L4/L5 epidural abscess by IR  TEE  Antimicrobials:   Nafcillin 12/23/2018 ongoing   Subjective: Patient seen and examined in the morning rounds.  No  overnight events.   Objective: Vitals:   01/01/19 2012 01/02/19 0015 01/02/19 0416 01/02/19 0808  BP: 115/75 112/77 103/65 (!) 98/53  Pulse: 81 85 79 78  Resp: 18 18 18 16   Temp: 97.8 F (36.6 C) 98.7 F (37.1 C) 99 F (37.2 C) 98.7 F (37.1 C)  TempSrc: Oral Oral Oral Oral  SpO2: 99% 98% 98% 97%  Weight:      Height:        Intake/Output Summary (Last 24 hours) at 01/02/2019 1231 Last data filed at 01/01/2019 2228 Gross per 24 hour  Intake 120 ml  Output -  Net 120 ml   Filed Weights   12/19/18 0518 12/19/18 2036  Weight: 79.4 kg 76.1 kg    Examination:  General exam: Appears calm and comfortable  Respiratory system: Clear to auscultation. Respiratory effort normal. Cardiovascular system: S1 & S2 heard, RRR. No JVD, murmurs, rubs, gallops or clicks. No pedal edema. Gastrointestinal system: Abdomen is nondistended, soft and nontender. No organomegaly or masses felt. Normal bowel sounds heard. Central nervous system: Alert and oriented. No focal neurological deficits. Extremities: Symmetric 5 x 5 power. Skin: No rashes, lesions or ulcers Psychiatry: Judgement and insight appear normal. Mood & affect appropriate.     Data Reviewed: I have personally reviewed following labs and imaging studies  CBC: Recent Labs  Lab 12/27/18 0453 12/31/18 1205  WBC 6.6 7.9  NEUTROABS 3.1 4.2  HGB 12.5* 13.2  HCT 37.3* 39.6  MCV 85.0 85.9  PLT 367 [id number]*   Basic Metabolic Panel: Recent Labs  Lab 12/27/18 0453 12/31/18 1205 01/02/19 0500  NA 136 136  --   K 3.6 4.0  --   CL 101 101  --   CO2 27 24  --  GLUCOSE 101* 51*  --   BUN 10 11  --   CREATININE 0.71 0.64 0.66  CALCIUM 9.0 9.2  --    GFR: Estimated Creatinine Clearance: 126.8 mL/min (by C-G formula based on SCr of 0.66 mg/dL). Liver Function Tests: Recent Labs  Lab 12/27/18 0453  AST 34  ALT 41  ALKPHOS 87  BILITOT 0.9  PROT 6.9  ALBUMIN 2.6*   No results for input(s): LIPASE, AMYLASE in the last 168  hours. No results for input(s): AMMONIA in the last 168 hours. Coagulation Profile: No results for input(s): INR, PROTIME in the last 168 hours. Cardiac Enzymes: No results for input(s): CKTOTAL, CKMB, CKMBINDEX, TROPONINI in the last 168 hours. BNP (last 3 results) No results for input(s): PROBNP in the last 8760 hours. HbA1C: No results for input(s): HGBA1C in the last 72 hours. CBG: No results for input(s): GLUCAP in the last 168 hours. Lipid Profile: No results for input(s): CHOL, HDL, LDLCALC, TRIG, CHOLHDL, LDLDIRECT in the last 72 hours. Thyroid Function Tests: No results for input(s): TSH, T4TOTAL, FREET4, T3FREE, THYROIDAB in the last 72 hours. Anemia Panel: No results for input(s): VITAMINB12, FOLATE, FERRITIN, TIBC, IRON, RETICCTPCT in the last 72 hours. Sepsis Labs: No results for input(s): PROCALCITON, LATICACIDVEN in the last 168 hours.  No results found for this or any previous visit (from the past 240 hour(s)).       Radiology Studies: No results found.      Scheduled Meds: . enoxaparin (LOVENOX) injection  40 mg Subcutaneous Q24H  . pantoprazole  40 mg Oral Daily  . polyethylene glycol  17 g Oral Daily  . senna-docusate  2 tablet Oral BID  . sodium chloride flush  10-40 mL Intracatheter Q12H  . sodium chloride flush  3 mL Intravenous Q12H   Continuous Infusions: . sodium chloride    . nafcillin IV 2 g (01/02/19 1016)     LOS: 14 days    Time spent: 20 minutes    Barb Merino, MD [facility] Pager [phone]  If 7PM-7AM, please contact night-coverage [email or web address] Password [email or web address] 01/02/2019, 12:31 PM

## 2019-01-02 NOTE — Plan of Care (Signed)
Progressing towards goals

## 2019-01-03 NOTE — Plan of Care (Signed)
Progressing towards goals

## 2019-01-03 NOTE — Progress Notes (Signed)
PROGRESS NOTE    Bradley Weber  [id number] DOB: 03/12/1976 DOA: 12/19/2018 PCP: Patient, No Pcp Per    Brief Narrative:  Patient is a 43 year old male with history of IV drug abuse, basal carcinoma of forehead and back, who presented with severe low back pain. Last heroin use 2 days before admission. He was also complaining of cough, fever, chills, decreased appetite, weight loss, shortness of breath, nausea and vomiting. Work-up in the emergency department showed lumbar epidural abscess. Chest x-ray was concerning for pneumonia. ID, neurosurgery were consulted. Echocardiogram showed tricuspid valve endocarditis. ID following for antibiotics. The patient underwent TEE and it did demonstrate an EF of 55-60%, no intracardiac thrombus, No valvular vegetations were seen. It did demonstrate a small PFO.   Assessment & Plan:   Principal Problem:   Spinal epidural abscess Active Problems:   Community acquired pneumonia   AKI (acute kidney injury) ([place])   Transaminitis  Sepsis secondary to MSSA bacteremia, epidural abscess, tricuspid valve endocarditis, pneumonia with septic emboli: Blood cultures 12/19/2018 MSSA Blood cultures 12/21/2018 no growth Currently on IV nafcillin as recommended by infectious disease until 02/17/2019, patient unable to have outpatient IV therapies because of ongoing drug use.  Polysubstance abuse/injectable drug use: Counseling done. Currently without any evidence of withdrawals.  Positive hep C screening: Will refer to GI on discharge.   DVT prophylaxis: Lovenox subcu Code Status: Full code Family Communication: None Disposition Plan: Stays in the hospital, not a candidate for outpatient IV therapies.   Consultants:   Interventional radiology  Infectious disease  Procedures:   Drains of L4/L5 epidural abscess by IR  TEE  Antimicrobials:   Nafcillin 12/23/2018 ongoing   Subjective: No new complaints.  Objective: Vitals:   01/03/19 0018  01/03/19 0428 01/03/19 0814 01/03/19 1126  BP: 108/78 (!) 102/54 105/68 112/68  Pulse: 83 78 72 73  Resp: 19 17 16 16   Temp: 98.1 F (36.7 C) 98.9 F (37.2 C) 98.8 F (37.1 C) 98.8 F (37.1 C)  TempSrc: Oral Oral Oral Oral  SpO2: 97% 97% 98% 100%  Weight:      Height:        Intake/Output Summary (Last 24 hours) at 01/03/2019 1336 Last data filed at 01/03/2019 0800 Gross per 24 hour  Intake 1100 ml  Output -  Net 1100 ml   Filed Weights   12/19/18 0518 12/19/18 2036  Weight: 79.4 kg 76.1 kg    Examination:  General exam: Appears calm and comfortable, on room air.  Sitting in couch. Respiratory system: Clear to auscultation. Respiratory effort normal. Cardiovascular system: S1 & S2 heard, RRR. No JVD, murmurs, rubs, gallops or clicks. No pedal edema. Gastrointestinal system: Abdomen is nondistended, soft and nontender. No organomegaly or masses felt. Normal bowel sounds heard. Central nervous system: Alert and oriented. No focal neurological deficits. Extremities: Symmetric 5 x 5 power. Skin: No rashes, lesions or ulcers Psychiatry: Judgement and insight appear normal. Mood & affect appropriate.     Data Reviewed: I have personally reviewed following labs and imaging studies  CBC: Recent Labs  Lab 12/31/18 1205  WBC 7.9  NEUTROABS 4.2  HGB 13.2  HCT 39.6  MCV 85.9  PLT [id number]*   Basic Metabolic Panel: Recent Labs  Lab 12/31/18 1205 01/02/19 0500  NA 136  --   K 4.0  --   CL 101  --   CO2 24  --   GLUCOSE 51*  --   BUN 11  --   CREATININE 0.64  0.66  CALCIUM 9.2  --    GFR: Estimated Creatinine Clearance: 126.8 mL/min (by C-G formula based on SCr of 0.66 mg/dL). Liver Function Tests: No results for input(s): AST, ALT, ALKPHOS, BILITOT, PROT, ALBUMIN in the last 168 hours. No results for input(s): LIPASE, AMYLASE in the last 168 hours. No results for input(s): AMMONIA in the last 168 hours. Coagulation Profile: No results for input(s): INR, PROTIME in  the last 168 hours. Cardiac Enzymes: No results for input(s): CKTOTAL, CKMB, CKMBINDEX, TROPONINI in the last 168 hours. BNP (last 3 results) No results for input(s): PROBNP in the last 8760 hours. HbA1C: No results for input(s): HGBA1C in the last 72 hours. CBG: No results for input(s): GLUCAP in the last 168 hours. Lipid Profile: No results for input(s): CHOL, HDL, LDLCALC, TRIG, CHOLHDL, LDLDIRECT in the last 72 hours. Thyroid Function Tests: No results for input(s): TSH, T4TOTAL, FREET4, T3FREE, THYROIDAB in the last 72 hours. Anemia Panel: No results for input(s): VITAMINB12, FOLATE, FERRITIN, TIBC, IRON, RETICCTPCT in the last 72 hours. Sepsis Labs: No results for input(s): PROCALCITON, LATICACIDVEN in the last 168 hours.  No results found for this or any previous visit (from the past 240 hour(s)).       Radiology Studies: No results found.      Scheduled Meds: . enoxaparin (LOVENOX) injection  40 mg Subcutaneous Q24H  . pantoprazole  40 mg Oral Daily  . polyethylene glycol  17 g Oral Daily  . senna-docusate  2 tablet Oral BID  . sodium chloride flush  10-40 mL Intracatheter Q12H  . sodium chloride flush  3 mL Intravenous Q12H   Continuous Infusions: . sodium chloride    . nafcillin IV 2 g (01/03/19 1035)     LOS: 15 days    Time spent: 20 minutes    Barb Merino, MD [facility] Pager [phone]  If 7PM-7AM, please contact night-coverage [email or web address] Password [facility] 01/03/2019, 1:36 PM

## 2019-01-04 NOTE — Plan of Care (Signed)
Progressing towards goals

## 2019-01-04 NOTE — Progress Notes (Signed)
PROGRESS NOTE    Bradley Weber  [id number] DOB: 01/17/1976 DOA: 12/19/2018 PCP: Patient, No Pcp Per    Brief Narrative:  Patient is a 43 year old male with history of IV drug abuse, basal carcinoma of forehead and back, who presented with severe low back pain. Last heroin use 2 days before admission. He was also complaining of cough, fever, chills, decreased appetite, weight loss, shortness of breath, nausea and vomiting. Work-up in the emergency department showed lumbar epidural abscess. Chest x-ray was concerning for pneumonia. ID, neurosurgery were consulted. Echocardiogram showed tricuspid valve endocarditis. ID following for antibiotics. The patient underwent TEE and it did demonstrate an EF of 55-60%, no intracardiac thrombus, No valvular vegetations were seen. It did demonstrate a small PFO.   Assessment & Plan:   Principal Problem:   Spinal epidural abscess Active Problems:   Community acquired pneumonia   AKI (acute kidney injury) ([place])   Transaminitis  Sepsis secondary to MSSA bacteremia, epidural abscess, tricuspid valve endocarditis, pneumonia with septic emboli: Blood cultures 12/19/2018 MSSA Blood cultures 12/21/2018 no growth Currently on IV nafcillin as recommended by infectious disease until 02/17/2019, patient unable to have outpatient IV therapies because of ongoing drug use.  Polysubstance abuse/injectable drug use: Counseling done. Currently without any evidence of withdrawals.  Positive hep C screening: Will refer to GI on discharge.   DVT prophylaxis: Lovenox subcu Code Status: Full code Family Communication: None Disposition Plan: Stays in the hospital, not a candidate for outpatient IV therapies.   Consultants:   Interventional radiology  Infectious disease  Procedures:   Drains of L4/L5 epidural abscess by IR  TEE  Antimicrobials:   Nafcillin 12/23/2018 ongoing   Subjective: No new complaints.  Objective: Vitals:   01/03/19 2333  01/04/19 0339 01/04/19 0815 01/04/19 1224  BP: 105/69 100/63 109/67 (!) 117/57  Pulse: 69 75 77 72  Resp: 15 15 15 20   Temp: 98.9 F (37.2 C) 97.9 F (36.6 C) 98.6 F (37 C) 98.4 F (36.9 C)  TempSrc: Oral Oral Oral Oral  SpO2: 98% 99% 98% 100%  Weight:      Height:        Intake/Output Summary (Last 24 hours) at 01/04/2019 1451 Last data filed at 01/04/2019 0800 Gross per 24 hour  Intake 830 ml  Output -  Net 830 ml   Filed Weights   12/19/18 0518 12/19/18 2036  Weight: 79.4 kg 76.1 kg    Examination:  General exam: Appears calm and comfortable, on room air.  Sitting in couch. Respiratory system: Clear to auscultation. Respiratory effort normal. Cardiovascular system: S1 & S2 heard, RRR. No JVD, murmurs, rubs, gallops or clicks. No pedal edema. Gastrointestinal system: Abdomen is nondistended, soft and nontender. No organomegaly or masses felt. Normal bowel sounds heard. Central nervous system: Alert and oriented. No focal neurological deficits. Extremities: Symmetric 5 x 5 power. Skin: No rashes, lesions or ulcers Psychiatry: Judgement and insight appear normal. Mood & affect appropriate.     Data Reviewed: I have personally reviewed following labs and imaging studies  CBC: Recent Labs  Lab 12/31/18 1205  WBC 7.9  NEUTROABS 4.2  HGB 13.2  HCT 39.6  MCV 85.9  PLT [id number]*   Basic Metabolic Panel: Recent Labs  Lab 12/31/18 1205 01/02/19 0500  NA 136  --   K 4.0  --   CL 101  --   CO2 24  --   GLUCOSE 51*  --   BUN 11  --   CREATININE 0.64  0.66  CALCIUM 9.2  --    GFR: Estimated Creatinine Clearance: 126.8 mL/min (by C-G formula based on SCr of 0.66 mg/dL). Liver Function Tests: No results for input(s): AST, ALT, ALKPHOS, BILITOT, PROT, ALBUMIN in the last 168 hours. No results for input(s): LIPASE, AMYLASE in the last 168 hours. No results for input(s): AMMONIA in the last 168 hours. Coagulation Profile: No results for input(s): INR, PROTIME in the  last 168 hours. Cardiac Enzymes: No results for input(s): CKTOTAL, CKMB, CKMBINDEX, TROPONINI in the last 168 hours. BNP (last 3 results) No results for input(s): PROBNP in the last 8760 hours. HbA1C: No results for input(s): HGBA1C in the last 72 hours. CBG: No results for input(s): GLUCAP in the last 168 hours. Lipid Profile: No results for input(s): CHOL, HDL, LDLCALC, TRIG, CHOLHDL, LDLDIRECT in the last 72 hours. Thyroid Function Tests: No results for input(s): TSH, T4TOTAL, FREET4, T3FREE, THYROIDAB in the last 72 hours. Anemia Panel: No results for input(s): VITAMINB12, FOLATE, FERRITIN, TIBC, IRON, RETICCTPCT in the last 72 hours. Sepsis Labs: No results for input(s): PROCALCITON, LATICACIDVEN in the last 168 hours.  No results found for this or any previous visit (from the past 240 hour(s)).       Radiology Studies: No results found.      Scheduled Meds: . enoxaparin (LOVENOX) injection  40 mg Subcutaneous Q24H  . pantoprazole  40 mg Oral Daily  . polyethylene glycol  17 g Oral Daily  . senna-docusate  2 tablet Oral BID  . sodium chloride flush  10-40 mL Intracatheter Q12H  . sodium chloride flush  3 mL Intravenous Q12H   Continuous Infusions: . sodium chloride    . nafcillin IV 2 g (01/04/19 1345)     LOS: 16 days    Time spent: 20 minutes    Barb Merino, MD [facility] Pager [phone]  If 7PM-7AM, please contact night-coverage [email or web address] Password [email or web address] 01/04/2019, 2:51 PM

## 2019-01-05 NOTE — Plan of Care (Signed)
Progressing toward goals. 

## 2019-01-05 NOTE — Progress Notes (Signed)
PROGRESS NOTE    Bradley Weber  [id number] DOB: 12/19/1975 DOA: 12/19/2018 PCP: Patient, No Pcp Per    Brief Narrative:  Patient is a 43 year old male with history of IV drug abuse, basal carcinoma of forehead and back, who presented with severe low back pain. Last heroin use 2 days before admission. He was also complaining of cough, fever, chills, decreased appetite, weight loss, shortness of breath, nausea and vomiting. Work-up in the emergency department showed lumbar epidural abscess. Chest x-ray was concerning for pneumonia. ID, neurosurgery were consulted. Echocardiogram showed tricuspid valve endocarditis. ID following for antibiotics. The patient underwent TEE and it did demonstrate an EF of 55-60%, no intracardiac thrombus, No valvular vegetations were seen. It did demonstrate a small PFO.   Assessment & Plan:   Principal Problem:   Spinal epidural abscess Active Problems:   Community acquired pneumonia   AKI (acute kidney injury) ([place])   Transaminitis  Sepsis secondary to MSSA bacteremia, epidural abscess, tricuspid valve endocarditis, pneumonia with septic emboli: Blood cultures 12/19/2018 MSSA Blood cultures 12/21/2018 no growth Currently on IV nafcillin as recommended by infectious disease until 02/17/2019, patient unable to have outpatient IV therapies because of ongoing drug use.  Polysubstance abuse/injectable drug use: Counseling done. Currently without any evidence of withdrawals.  Positive hep C screening: Will refer to GI on discharge.   DVT prophylaxis: Lovenox subcu Code Status: Full code Family Communication: None Disposition Plan: Stays in the hospital, not a candidate for outpatient IV therapies.   Consultants:   Interventional radiology  Infectious disease  Procedures:   Drains of L4/L5 epidural abscess by IR  TEE  Antimicrobials:   Nafcillin 12/23/2018 ongoing   Subjective: No new complaints.  Objective: Vitals:   01/04/19 2323  01/05/19 0425 01/05/19 0800 01/05/19 1126  BP: 115/82 104/60 115/64 110/77  Pulse: 86 81 73 79  Resp: 15 16 16 20   Temp: 99 F (37.2 C) 98.5 F (36.9 C)  98.6 F (37 C)  TempSrc: Oral Oral  Oral  SpO2: 99% 100% 99% 100%  Weight:      Height:        Intake/Output Summary (Last 24 hours) at 01/05/2019 1324 Last data filed at 01/04/2019 1733 Gross per 24 hour  Intake 530 ml  Output -  Net 530 ml   Filed Weights   12/19/18 0518 12/19/18 2036  Weight: 79.4 kg 76.1 kg    Examination:  General exam: Appears calm and comfortable, on room air.  Sitting in couch. Respiratory system: Clear to auscultation. Respiratory effort normal. Cardiovascular system: S1 & S2 heard, RRR. No JVD, murmurs, rubs, gallops or clicks. No pedal edema. Gastrointestinal system: Abdomen is nondistended, soft and nontender. No organomegaly or masses felt. Normal bowel sounds heard. Central nervous system: Alert and oriented. No focal neurological deficits. Extremities: Symmetric 5 x 5 power. Skin: No rashes, lesions or ulcers Psychiatry: Judgement and insight appear normal. Mood & affect appropriate.     Data Reviewed: I have personally reviewed following labs and imaging studies  CBC: Recent Labs  Lab 12/31/18 1205  WBC 7.9  NEUTROABS 4.2  HGB 13.2  HCT 39.6  MCV 85.9  PLT [id number]*   Basic Metabolic Panel: Recent Labs  Lab 12/31/18 1205 01/02/19 0500  NA 136  --   K 4.0  --   CL 101  --   CO2 24  --   GLUCOSE 51*  --   BUN 11  --   CREATININE 0.64 0.66  CALCIUM 9.2  --  GFR: Estimated Creatinine Clearance: 126.8 mL/min (by C-G formula based on SCr of 0.66 mg/dL). Liver Function Tests: No results for input(s): AST, ALT, ALKPHOS, BILITOT, PROT, ALBUMIN in the last 168 hours. No results for input(s): LIPASE, AMYLASE in the last 168 hours. No results for input(s): AMMONIA in the last 168 hours. Coagulation Profile: No results for input(s): INR, PROTIME in the last 168 hours. Cardiac  Enzymes: No results for input(s): CKTOTAL, CKMB, CKMBINDEX, TROPONINI in the last 168 hours. BNP (last 3 results) No results for input(s): PROBNP in the last 8760 hours. HbA1C: No results for input(s): HGBA1C in the last 72 hours. CBG: No results for input(s): GLUCAP in the last 168 hours. Lipid Profile: No results for input(s): CHOL, HDL, LDLCALC, TRIG, CHOLHDL, LDLDIRECT in the last 72 hours. Thyroid Function Tests: No results for input(s): TSH, T4TOTAL, FREET4, T3FREE, THYROIDAB in the last 72 hours. Anemia Panel: No results for input(s): VITAMINB12, FOLATE, FERRITIN, TIBC, IRON, RETICCTPCT in the last 72 hours. Sepsis Labs: No results for input(s): PROCALCITON, LATICACIDVEN in the last 168 hours.  No results found for this or any previous visit (from the past 240 hour(s)).       Radiology Studies: No results found.      Scheduled Meds: . enoxaparin (LOVENOX) injection  40 mg Subcutaneous Q24H  . pantoprazole  40 mg Oral Daily  . polyethylene glycol  17 g Oral Daily  . senna-docusate  2 tablet Oral BID  . sodium chloride flush  10-40 mL Intracatheter Q12H  . sodium chloride flush  3 mL Intravenous Q12H   Continuous Infusions: . sodium chloride    . nafcillin IV 2 g (01/05/19 1124)     LOS: 17 days    Time spent: 20 minutes    Barb Merino, MD [facility] Pager [phone]  If 7PM-7AM, please contact night-coverage [email or web address] Password [email or web address] 01/05/2019, 1:24 PM

## 2019-01-06 NOTE — Progress Notes (Signed)
PROGRESS NOTE    Bradley Weber  [id number] DOB: 10/02/1975 DOA: 12/19/2018 PCP: Patient, No Pcp Per    Brief Narrative:  Patient is a 43 year old male with history of IV drug abuse, basal carcinoma of forehead and back, who presented with severe low back pain. Last heroin use 2 days before admission. He was also complaining of cough, fever, chills, decreased appetite, weight loss, shortness of breath, nausea and vomiting. Work-up in the emergency department showed lumbar epidural abscess. Chest x-ray was concerning for pneumonia. ID, neurosurgery were consulted. Echocardiogram showed tricuspid valve endocarditis. ID following for antibiotics. The patient underwent TEE and it did demonstrate an EF of 55-60%, no intracardiac thrombus, No valvular vegetations were seen. It did demonstrate a small PFO.   Assessment & Plan:   Principal Problem:   Spinal epidural abscess Active Problems:   Community acquired pneumonia   AKI (acute kidney injury) ([place])   Transaminitis  Sepsis secondary to MSSA bacteremia, epidural abscess, tricuspid valve endocarditis, pneumonia with septic emboli: Blood cultures 12/19/2018 MSSA Blood cultures 12/21/2018 no growth Currently on IV nafcillin as recommended by infectious disease until 02/17/2019, patient unable to have outpatient IV therapies because of ongoing drug use.  Polysubstance abuse/injectable drug use: Counseling done. Currently without any evidence of withdrawals.  Positive hep C screening: Will refer to GI on discharge.   DVT prophylaxis: Lovenox subcu Code Status: Full code Family Communication: None Disposition Plan: Stays in the hospital, not a candidate for outpatient IV therapies.   Consultants:   Interventional radiology  Infectious disease  Procedures:   Drains of L4/L5 epidural abscess by IR  TEE  Antimicrobials:   Nafcillin 12/23/2018 ongoing   Subjective: No new complaints. He missed his son's 40 th birthday.   Objective: Vitals:   01/05/19 1929 01/05/19 2356 01/06/19 0338 01/06/19 0856  BP: 118/72 (!) 142/80 109/79 115/77  Pulse: 79 77 80 72  Resp: 17 17 17 16   Temp: 98.3 F (36.8 C) 98.2 F (36.8 C) 98.4 F (36.9 C) 98 F (36.7 C)  TempSrc: Oral Oral Other (Comment) Oral  SpO2: 99% 100% 97% 99%  Weight:      Height:        Intake/Output Summary (Last 24 hours) at 01/06/2019 1048 Last data filed at 01/05/2019 1600 Gross per 24 hour  Intake 1000 ml  Output -  Net 1000 ml   Filed Weights   12/19/18 0518 12/19/18 2036  Weight: 79.4 kg 76.1 kg    Examination:  General exam: Appears calm and comfortable, on room air. Respiratory system: Clear to auscultation. Respiratory effort normal. Cardiovascular system: S1 & S2 heard, RRR. No JVD, murmurs, rubs, gallops or clicks. No pedal edema. Gastrointestinal system: Abdomen is nondistended, soft and nontender. No organomegaly or masses felt. Normal bowel sounds heard. Central nervous system: Alert and oriented. No focal neurological deficits. Extremities: Symmetric 5 x 5 power. Skin: No rashes, lesions or ulcers Psychiatry: Judgement and insight appear normal. Mood & affect appropriate.     Data Reviewed: I have personally reviewed following labs and imaging studies  CBC: Recent Labs  Lab 12/31/18 1205  WBC 7.9  NEUTROABS 4.2  HGB 13.2  HCT 39.6  MCV 85.9  PLT [id number]*   Basic Metabolic Panel: Recent Labs  Lab 12/31/18 1205 01/02/19 0500  NA 136  --   K 4.0  --   CL 101  --   CO2 24  --   GLUCOSE 51*  --   BUN 11  --  CREATININE 0.64 0.66  CALCIUM 9.2  --    GFR: Estimated Creatinine Clearance: 126.8 mL/min (by C-G formula based on SCr of 0.66 mg/dL). Liver Function Tests: No results for input(s): AST, ALT, ALKPHOS, BILITOT, PROT, ALBUMIN in the last 168 hours. No results for input(s): LIPASE, AMYLASE in the last 168 hours. No results for input(s): AMMONIA in the last 168 hours. Coagulation Profile: No results  for input(s): INR, PROTIME in the last 168 hours. Cardiac Enzymes: No results for input(s): CKTOTAL, CKMB, CKMBINDEX, TROPONINI in the last 168 hours. BNP (last 3 results) No results for input(s): PROBNP in the last 8760 hours. HbA1C: No results for input(s): HGBA1C in the last 72 hours. CBG: No results for input(s): GLUCAP in the last 168 hours. Lipid Profile: No results for input(s): CHOL, HDL, LDLCALC, TRIG, CHOLHDL, LDLDIRECT in the last 72 hours. Thyroid Function Tests: No results for input(s): TSH, T4TOTAL, FREET4, T3FREE, THYROIDAB in the last 72 hours. Anemia Panel: No results for input(s): VITAMINB12, FOLATE, FERRITIN, TIBC, IRON, RETICCTPCT in the last 72 hours. Sepsis Labs: No results for input(s): PROCALCITON, LATICACIDVEN in the last 168 hours.  No results found for this or any previous visit (from the past 240 hour(s)).       Radiology Studies: No results found.      Scheduled Meds: . enoxaparin (LOVENOX) injection  40 mg Subcutaneous Q24H  . pantoprazole  40 mg Oral Daily  . polyethylene glycol  17 g Oral Daily  . senna-docusate  2 tablet Oral BID  . sodium chloride flush  10-40 mL Intracatheter Q12H  . sodium chloride flush  3 mL Intravenous Q12H   Continuous Infusions: . sodium chloride    . nafcillin IV 2 g (01/06/19 0959)     LOS: 18 days    Time spent: 20 minutes    Barb Merino, MD [facility] Pager [phone]  If 7PM-7AM, please contact night-coverage [email or web address] Password [facility] 01/06/2019, 10:48 AM

## 2019-01-07 LAB — CBC WITH DIFFERENTIAL/PLATELET
Abs Immature Granulocytes: 0.01 10*3/uL (ref 0.00–0.07)
Basophils Absolute: 0.1 10*3/uL (ref 0.0–0.1)
Basophils Relative: 1 %
Eosinophils Absolute: 0.3 10*3/uL (ref 0.0–0.5)
Eosinophils Relative: 5 %
HCT: 33.2 % — ABNORMAL LOW (ref 39.0–52.0)
Hemoglobin: 10.8 g/dL — ABNORMAL LOW (ref 13.0–17.0)
Immature Granulocytes: 0 %
Lymphocytes Relative: 41 %
Lymphs Abs: 2.3 10*3/uL (ref 0.7–4.0)
MCH: 28.8 pg (ref 26.0–34.0)
MCHC: 32.5 g/dL (ref 30.0–36.0)
MCV: 88.5 fL (ref 80.0–100.0)
Monocytes Absolute: 0.6 10*3/uL (ref 0.1–1.0)
Monocytes Relative: 10 %
Neutro Abs: 2.5 10*3/uL (ref 1.7–7.7)
Neutrophils Relative %: 43 %
Platelets: 431 10*3/uL — ABNORMAL HIGH (ref 150–400)
RBC: 3.75 MIL/uL — ABNORMAL LOW (ref 4.22–5.81)
RDW: 15.1 % (ref 11.5–15.5)
WBC: 5.6 10*3/uL (ref 4.0–10.5)
nRBC: 0 % (ref 0.0–0.2)

## 2019-01-07 LAB — COMPREHENSIVE METABOLIC PANEL
ALT: 52 U/L — ABNORMAL HIGH (ref 0–44)
AST: 40 U/L (ref 15–41)
Albumin: 2.9 g/dL — ABNORMAL LOW (ref 3.5–5.0)
Alkaline Phosphatase: 58 U/L (ref 38–126)
Anion gap: 9 (ref 5–15)
BUN: 9 mg/dL (ref 6–20)
CO2: 25 mmol/L (ref 22–32)
Calcium: 8.8 mg/dL — ABNORMAL LOW (ref 8.9–10.3)
Chloride: 104 mmol/L (ref 98–111)
Creatinine, Ser: 0.75 mg/dL (ref 0.61–1.24)
GFR calc Af Amer: >60 mL/min (ref >60)
GFR calc non Af Amer: >60 mL/min (ref >60)
Glucose, Bld: 77 mg/dL (ref 70–99)
Potassium: 3.8 mmol/L (ref 3.5–5.1)
Sodium: 138 mmol/L (ref 135–145)
Total Bilirubin: 1.8 mg/dL — ABNORMAL HIGH (ref 0.3–1.2)
Total Protein: 6.8 g/dL (ref 6.5–8.1)

## 2019-01-07 NOTE — Progress Notes (Signed)
PROGRESS NOTE    Bradley Weber  [id number] DOB: May 23, 1975 DOA: 12/19/2018 PCP: Patient, No Pcp Per    Brief Narrative:  Patient is a 43 year old male with history of IV drug abuse, basal carcinoma of forehead and back, who presented with severe low back pain. Last heroin use 2 days before admission. He was also complaining of cough, fever, chills, decreased appetite, weight loss, shortness of breath, nausea and vomiting. Work-up in the emergency department showed lumbar epidural abscess. Chest x-ray was concerning for pneumonia. ID, neurosurgery were consulted. Echocardiogram showed tricuspid valve endocarditis. ID following for antibiotics.The patient underwent TEE and it did demonstrate an EF of 55-60%, no intracardiac thrombus, No valvular vegetations were seen. It did demonstrate a small PFO.   Assessment & Plan:   Principal Problem:   Spinal epidural abscess Active Problems:   Community acquired pneumonia   AKI (acute kidney injury) ([employer])   Transaminitis  Sepsis secondary to MSSA bacteremia, epidural abscess, tricuspid valve endocarditis, pneumonia with septic emboli: Blood cultures 12/19/2018 MSSA Blood cultures 12/21/2018 no growth Currently on IV nafcillin as recommended by infectious disease until 02/17/2019, patient unable to have outpatient IV therapies because of ongoing drug use. Surveillance lab including liver function test and renal function test are fairly normal today.  Polysubstance abuse/injectable drug use: Counseling done. Currently without any evidence of withdrawals.  Positive hep C screening: Will need referral to GI on discharge.   DVT prophylaxis: Lovenox subcu Code Status: Full code Family Communication: None Disposition Plan: Stays in the hospital, not a candidate for outpatient IV therapies.   Consultants:   Interventional radiology  Infectious disease  Procedures:   Drains of L4/L5 epidural abscess by IR  TEE  Antimicrobials:    Nafcillin 12/23/2018 ongoing   Subjective: No new complaints.  He was wondering why that he can have oral antibiotics if he decides to leave.  I suggested to him and counseled him that is not a good idea.  Objective: Vitals:   01/06/19 2351 01/07/19 0414 01/07/19 0938 01/07/19 1245  BP: 121/77 99/62 119/75 110/82  Pulse: 63 72 72 71  Resp: 18 18 16 16   Temp: 99 F (37.2 C) 98.4 F (36.9 C) 97.7 F (36.5 C) 98.2 F (36.8 C)  TempSrc: Oral Oral Oral Oral  SpO2: 100% 100% 93% 100%  Weight:      Height:        Intake/Output Summary (Last 24 hours) at 01/07/2019 1312 Last data filed at 01/06/2019 1551 Gross per 24 hour  Intake 240 ml  Output -  Net 240 ml   Filed Weights   12/19/18 0518 12/19/18 2036  Weight: 79.4 kg 76.1 kg    Examination:  General exam: Appears calm and comfortable, on room air.  Eating breakfast. Respiratory system: Clear to auscultation. Respiratory effort normal. Cardiovascular system: S1 & S2 heard, RRR. No JVD, murmurs, rubs, gallops or clicks. No pedal edema. Gastrointestinal system: Abdomen is nondistended, soft and nontender. No organomegaly or masses felt. Normal bowel sounds heard. Central nervous system: Alert and oriented. No focal neurological deficits. Extremities: Symmetric 5 x 5 power. Skin: No rashes, lesions or ulcers Psychiatry: Judgement and insight appear normal. Mood & affect appropriate.     Data Reviewed: I have personally reviewed following labs and imaging studies  CBC: Recent Labs  Lab 01/07/19 0400  WBC 5.6  NEUTROABS 2.5  HGB 10.8*  HCT 33.2*  MCV 88.5  PLT 99991111*   Basic Metabolic Panel: Recent Labs  Lab 01/02/19  0500 01/07/19 0400  NA  --  138  K  --  3.8  CL  --  104  CO2  --  25  GLUCOSE  --  77  BUN  --  9  CREATININE 0.66 0.75  CALCIUM  --  8.8*   GFR: Estimated Creatinine Clearance: 126.8 mL/min (by C-G formula based on SCr of 0.75 mg/dL). Liver Function Tests: Recent Labs  Lab 01/07/19 0400   AST 40  ALT 52*  ALKPHOS 58  BILITOT 1.8*  PROT 6.8  ALBUMIN 2.9*   No results for input(s): LIPASE, AMYLASE in the last 168 hours. No results for input(s): AMMONIA in the last 168 hours. Coagulation Profile: No results for input(s): INR, PROTIME in the last 168 hours. Cardiac Enzymes: No results for input(s): CKTOTAL, CKMB, CKMBINDEX, TROPONINI in the last 168 hours. BNP (last 3 results) No results for input(s): PROBNP in the last 8760 hours. HbA1C: No results for input(s): HGBA1C in the last 72 hours. CBG: No results for input(s): GLUCAP in the last 168 hours. Lipid Profile: No results for input(s): CHOL, HDL, LDLCALC, TRIG, CHOLHDL, LDLDIRECT in the last 72 hours. Thyroid Function Tests: No results for input(s): TSH, T4TOTAL, FREET4, T3FREE, THYROIDAB in the last 72 hours. Anemia Panel: No results for input(s): VITAMINB12, FOLATE, FERRITIN, TIBC, IRON, RETICCTPCT in the last 72 hours. Sepsis Labs: No results for input(s): PROCALCITON, LATICACIDVEN in the last 168 hours.  No results found for this or any previous visit (from the past 240 hour(s)).       Radiology Studies: No results found.      Scheduled Meds: . enoxaparin (LOVENOX) injection  40 mg Subcutaneous Q24H  . pantoprazole  40 mg Oral Daily  . polyethylene glycol  17 g Oral Daily  . senna-docusate  2 tablet Oral BID  . sodium chloride flush  10-40 mL Intracatheter Q12H  . sodium chloride flush  3 mL Intravenous Q12H   Continuous Infusions: . sodium chloride    . nafcillin IV 2 g (01/07/19 1034)     LOS: 19 days    Time spent: 20 minutes    Barb Merino, MD [facility] Pager [phone]  If 7PM-7AM, please contact night-coverage [email or web address] Password [email or web address] 01/07/2019, 1:12 PM

## 2019-01-07 NOTE — Progress Notes (Signed)
Pt stated he wants to leave as to take care of things outside the hospital. He as advised on the consequences of not completing antibiotic dosage. He insisted to leave. Physician on call was notified.

## 2019-01-10 NOTE — Discharge Summary (Signed)
We had taken care of this patient on 01/07/2019.  Apparently at nighttime, he left AGAINST MEDICAL ADVICE. Patient was able to make his healthcare decisions and he left despite counseling and knowing all risks of leaving early. I called him, [phone] and discussed that he should have not left the hospital.  Patient states that he is doing well.  He had some business to take care of and he is coming to the emergency room tonight.  Oral medications will not be treatment option for him for his MSSA bacteremia.  However, patient states that he will check into the emergency room, which I encouraged for him to do.  He will need recultures and admission to finish antibiotics.  He said that he has not done any injectable drugs after leaving AMA.

## 2019-01-24 ENCOUNTER — Other Ambulatory Visit: Payer: Self-pay

## 2019-01-24 ENCOUNTER — Encounter (HOSPITAL_COMMUNITY): Payer: Self-pay | Admitting: Emergency Medicine

## 2019-01-24 ENCOUNTER — Emergency Department (HOSPITAL_COMMUNITY)
Admission: EM | Admit: 2019-01-24 | Discharge: 2019-01-24 | Discharge status: Against Medical Advice | Payer: Medicaid Other | Attending: Emergency Medicine | Admitting: Emergency Medicine

## 2019-01-24 ENCOUNTER — Emergency Department (HOSPITAL_COMMUNITY)
Admission: EM | Admit: 2019-01-24 | Discharge: 2019-01-24 | Disposition: A | Discharge status: Home / Self Care | Payer: Self-pay | Attending: Emergency Medicine | Admitting: Emergency Medicine

## 2019-01-24 DIAGNOSIS — I339 Acute and subacute endocarditis, unspecified: Secondary | ICD-10-CM | POA: Insufficient documentation

## 2019-01-24 DIAGNOSIS — F1990 Other psychoactive substance use, unspecified, uncomplicated: Secondary | ICD-10-CM

## 2019-01-24 DIAGNOSIS — F17200 Nicotine dependence, unspecified, uncomplicated: Secondary | ICD-10-CM | POA: Insufficient documentation

## 2019-01-24 DIAGNOSIS — Z5321 Procedure and treatment not carried out due to patient leaving prior to being seen by health care provider: Secondary | ICD-10-CM | POA: Insufficient documentation

## 2019-01-24 LAB — CBC WITH DIFFERENTIAL/PLATELET
Abs Immature Granulocytes: 0.01 10*3/uL (ref 0.00–0.07)
Abs Immature Granulocytes: 0.01 10*3/uL (ref 0.00–0.07)
Basophils Absolute: 0.1 10*3/uL (ref 0.0–0.1)
Basophils Absolute: 0 10*3/uL (ref 0.0–0.1)
Basophils Relative: 1 %
Basophils Relative: 0 %
Eosinophils Absolute: 0.4 10*3/uL (ref 0.0–0.5)
Eosinophils Absolute: 0.3 10*3/uL (ref 0.0–0.5)
Eosinophils Relative: 6 %
Eosinophils Relative: 6 %
HCT: 36.8 % — ABNORMAL LOW (ref 39.0–52.0)
HCT: 38.5 % — ABNORMAL LOW (ref 39.0–52.0)
Hemoglobin: 12.2 g/dL — ABNORMAL LOW (ref 13.0–17.0)
Hemoglobin: 12.9 g/dL — ABNORMAL LOW (ref 13.0–17.0)
Immature Granulocytes: 0 %
Immature Granulocytes: 0 %
Lymphocytes Relative: 41 %
Lymphocytes Relative: 40 %
Lymphs Abs: 2.7 10*3/uL (ref 0.7–4.0)
Lymphs Abs: 1.9 10*3/uL (ref 0.7–4.0)
MCH: 30.3 pg (ref 26.0–34.0)
MCH: 30.6 pg (ref 26.0–34.0)
MCHC: 33.2 g/dL (ref 30.0–36.0)
MCHC: 33.5 g/dL (ref 30.0–36.0)
MCV: 91.3 fL (ref 80.0–100.0)
MCV: 91.2 fL (ref 80.0–100.0)
Monocytes Absolute: 0.5 10*3/uL (ref 0.1–1.0)
Monocytes Absolute: 0.4 10*3/uL (ref 0.1–1.0)
Monocytes Relative: 8 %
Monocytes Relative: 9 %
Neutro Abs: 2.9 10*3/uL (ref 1.7–7.7)
Neutro Abs: 2.1 10*3/uL (ref 1.7–7.7)
Neutrophils Relative %: 44 %
Neutrophils Relative %: 45 %
Platelets: 234 10*3/uL (ref 150–400)
Platelets: 209 10*3/uL (ref 150–400)
RBC: 4.03 MIL/uL — ABNORMAL LOW (ref 4.22–5.81)
RBC: 4.22 MIL/uL (ref 4.22–5.81)
RDW: 14.7 % (ref 11.5–15.5)
RDW: 14.8 % (ref 11.5–15.5)
WBC: 6.5 10*3/uL (ref 4.0–10.5)
WBC: 4.7 10*3/uL (ref 4.0–10.5)
nRBC: 0 % (ref 0.0–0.2)
nRBC: 0 % (ref 0.0–0.2)

## 2019-01-24 LAB — COMPREHENSIVE METABOLIC PANEL
ALT: 86 U/L — ABNORMAL HIGH (ref 0–44)
ALT: 91 U/L — ABNORMAL HIGH (ref 0–44)
AST: 59 U/L — ABNORMAL HIGH (ref 15–41)
AST: 65 U/L — ABNORMAL HIGH (ref 15–41)
Albumin: 3.8 g/dL (ref 3.5–5.0)
Albumin: 3.7 g/dL (ref 3.5–5.0)
Alkaline Phosphatase: 57 U/L (ref 38–126)
Alkaline Phosphatase: 59 U/L (ref 38–126)
Anion gap: 13 (ref 5–15)
Anion gap: 9 (ref 5–15)
BUN: 11 mg/dL (ref 6–20)
BUN: 10 mg/dL (ref 6–20)
CO2: 23 mmol/L (ref 22–32)
CO2: 28 mmol/L (ref 22–32)
Calcium: 9.2 mg/dL (ref 8.9–10.3)
Calcium: 9.4 mg/dL (ref 8.9–10.3)
Chloride: 102 mmol/L (ref 98–111)
Chloride: 103 mmol/L (ref 98–111)
Creatinine, Ser: 0.93 mg/dL (ref 0.61–1.24)
Creatinine, Ser: 0.98 mg/dL (ref 0.61–1.24)
GFR calc Af Amer: >60 mL/min (ref >60)
GFR calc Af Amer: >60 mL/min (ref >60)
GFR calc non Af Amer: >60 mL/min (ref >60)
GFR calc non Af Amer: >60 mL/min (ref >60)
Glucose, Bld: 106 mg/dL — ABNORMAL HIGH (ref 70–99)
Glucose, Bld: 118 mg/dL — ABNORMAL HIGH (ref 70–99)
Potassium: 3.7 mmol/L (ref 3.5–5.1)
Potassium: 3.7 mmol/L (ref 3.5–5.1)
Sodium: 138 mmol/L (ref 135–145)
Sodium: 140 mmol/L (ref 135–145)
Total Bilirubin: 1.5 mg/dL — ABNORMAL HIGH (ref 0.3–1.2)
Total Bilirubin: 1.6 mg/dL — ABNORMAL HIGH (ref 0.3–1.2)
Total Protein: 7.6 g/dL (ref 6.5–8.1)
Total Protein: 7.8 g/dL (ref 6.5–8.1)

## 2019-01-24 LAB — URINALYSIS, ROUTINE W REFLEX MICROSCOPIC
Bilirubin Urine: NEGATIVE
Glucose, UA: NEGATIVE mg/dL
Hgb urine dipstick: NEGATIVE
Ketones, ur: NEGATIVE mg/dL
Leukocytes,Ua: NEGATIVE
Nitrite: NEGATIVE
Protein, ur: NEGATIVE mg/dL
Specific Gravity, Urine: 1.025 (ref 1.005–1.030)
pH: 6 (ref 5.0–8.0)

## 2019-01-24 LAB — LACTIC ACID, PLASMA
Lactic Acid, Venous: 1.1 mmol/L (ref 0.5–1.9)
Lactic Acid, Venous: 1.1 mmol/L (ref 0.5–1.9)

## 2019-01-24 MED ORDER — AMOXICILLIN-POT CLAVULANATE 875-125 MG PO TABS: 1.0000 | ORAL_TABLET | Freq: Two times a day (BID) | ORAL | 0 refills | Status: AC

## 2019-01-24 NOTE — Discharge Instructions (Addendum)
Please make sure you take all your medication without skipping any doses.  Please make sure you return to the emergency department if you have new or worsening symptoms.  If your blood cultures are positive we will call you and we will need you to come to the hospital for admission.  Please follow-up with infectious disease doctors.  Please see the GI doctors for your positive hepatitis screen.

## 2019-01-24 NOTE — ED Triage Notes (Signed)
Pt reports he was here earlier in the morning and was sleeping, thus he did not hear his name and was discharged out of the system. Pt reports he spent 3 weeks in the hospital with an infection and wanted to be rechecked to see if he needed any additional antibiotics.

## 2019-01-24 NOTE — ED Notes (Signed)
Pt has still not returned to exam room 4.

## 2019-01-24 NOTE — ED Triage Notes (Signed)
Pt to ED and st's he was in the hospital for abscess on his spine and sepsis.  Pt left AMA  And now returns.  Pt last used IV drugs 4 days ago (opiates)

## 2019-01-24 NOTE — ED Notes (Signed)
Pt has returned to exam room 4.

## 2019-01-24 NOTE — ED Notes (Signed)
No answer for ready room x3.

## 2019-01-24 NOTE — ED Notes (Signed)
Pt A&ox4, ambulatory at d/c with independent steady gait, NAD. Pt verbalized understanding of d/c instructions and follow up care.

## 2019-01-24 NOTE — ED Notes (Signed)
Pt insisted he walk out side and speak to a family member. Due to IV in pts arm this EMT requested to walk out with pt and he refused that and requested IV be removed so he could walk out to car. I removed IV and pt walked out saying he would be right back. Advised pt if he stayed longer then 5 mins he would be removed from system.

## 2019-01-24 NOTE — ED Provider Notes (Signed)
[facility] EMERGENCY DEPARTMENT Provider Note   CSN: [id number] Arrival date & time: 01/24/19  1026     History   Chief Complaint Chief Complaint  Patient presents with  . Follow-up    HPI Bradley Weber is a 43 y.o. male.     Patient is a 43 year old gentleman with history of IV drug use, basal cell carcinoma presenting back to the emergency department after signing out of his admission AMA. Patient was admitted on 3 September sepsis secondary to MSSA bacteremia,epidural abscess,tricuspid valve endocarditis and community-acquired pneumonia  Patient left AMA on 22 September but was urged to return to complete his antibiotics.  Patient had 2 epidural abscesses at L4-L5 drained on 12/23/2018 by interventional radiology.  Also screen positive for hepatitis C.  He was getting ongoing IV nafcillin and was not appropriate for p.o. transition at the time of discharge.  Today he reports that he is feeling better.  Reports that he has some mild low back pain but otherwise no symptoms.  Reports that he last used IV drugs about 5 days ago.     Past Medical History:  Diagnosis Date  . ADD (attention deficit disorder)   . Basal cell carcinoma   . Chronic headaches   . Kidney stone   . MRSA (methicillin resistant Staphylococcus aureus)     Patient Active Problem List   Diagnosis Date Noted  . AKI (acute kidney injury) ([place]) 12/20/2018  . Transaminitis 12/20/2018  . Community acquired pneumonia 12/19/2018  . Spinal epidural abscess   . Abdominal pain, epigastric 01/20/2011    Past Surgical History:  Procedure Laterality Date  . MOUTH SURGERY    . MRSA cyst excision     chest  . SKIN CANCER EXCISION    . TEE WITHOUT CARDIOVERSION N/A 12/27/2018   Procedure: TRANSESOPHAGEAL ECHOCARDIOGRAM (TEE);  Surgeon: Lelon Perla, MD;  Location: [facility] ENDOSCOPY;  Service: Cardiovascular;  Laterality: N/A;        Home Medications    Prior to Admission medications    Medication Sig Start Date End Date Taking? Authorizing Provider  amoxicillin-clavulanate (AUGMENTIN) 875-125 MG tablet Take 1 tablet by mouth every 12 (twelve) hours. 01/24/19 03/07/19  Madilyn Hook A, PA-C  ibuprofen (ADVIL) 200 MG tablet Take 200 mg by mouth every 6 (six) hours as needed for moderate pain.    [provider]    Family History Family History  Problem Relation Age of Onset  . Heart attack Father   . Pancreatic cancer Maternal Grandmother   . Diabetes Maternal Grandmother     Social History Social History   Tobacco Use  . Smoking status: Current Every Day Smoker  . Smokeless tobacco: Never Used  Substance Use Topics  . Alcohol use: No    Comment: occasionally  . Drug use: Yes    Types: Oxycodone, IV     Allergies   Patient has no known allergies.   Review of Systems Review of Systems  Constitutional: Negative for chills, fatigue and fever.  Respiratory: Negative for cough and shortness of breath.   Cardiovascular: Negative for chest pain.  Gastrointestinal: Negative for abdominal pain, nausea and rectal pain.  Genitourinary: Negative for dysuria.  Musculoskeletal: Positive for back pain.  Skin: Negative for rash and wound.  Allergic/Immunologic: Positive for immunocompromised state.  Neurological: Negative for light-headedness.     Physical Exam Updated Vital Signs BP 132/79 (BP Location: Right Arm)   Pulse 90   Temp 98.1 F (36.7 C) (  Oral)   Resp 20   Ht 5\' 11"  (1.803 m)   Wt 74.8 kg   SpO2 99%   BMI 23.01 kg/m   Physical Exam Vitals signs and nursing note reviewed.  Constitutional:      General: He is not in acute distress.    Appearance: Normal appearance. He is not ill-appearing, toxic-appearing or diaphoretic.  HENT:     Head: Normocephalic.     Mouth/Throat:     Mouth: Mucous membranes are moist.  Eyes:     Conjunctiva/sclera: Conjunctivae normal.  Cardiovascular:     Rate and Rhythm: Normal rate and regular rhythm.   Pulmonary:     Effort: Pulmonary effort is normal.  Abdominal:     General: Abdomen is flat. Bowel sounds are normal.  Skin:    General: Skin is dry.  Neurological:     General: No focal deficit present.     Mental Status: He is alert.  Psychiatric:        Mood and Affect: Mood normal.      ED Treatments / Results  Labs (all labs ordered are listed, but only abnormal results are displayed) Labs Reviewed  CBC WITH DIFFERENTIAL/PLATELET - Abnormal; Notable for the following components:      Result Value   Hemoglobin 12.9 (*)    HCT 38.5 (*)    All other components within normal limits  COMPREHENSIVE METABOLIC PANEL - Abnormal; Notable for the following components:   Glucose, Bld 118 (*)    AST 65 (*)    ALT 91 (*)    Total Bilirubin 1.6 (*)    All other components within normal limits  CULTURE, BLOOD (ROUTINE X 2)  LACTIC ACID, PLASMA  LACTIC ACID, PLASMA    EKG None  Radiology No results found.  Procedures Procedures (including critical care time)  Medications Ordered in ED Medications - No data to display   Initial Impression / Assessment and Plan / ED Course  I have reviewed the triage vital signs and the nursing notes.  Pertinent labs & imaging results that were available during my care of the patient were reviewed by me and considered in my medical decision making (see chart for details).  Clinical Course as of Jan 23 1349  Fri Jan 24, 2019  1343 Discussed case with infectious disease.  Patient previously left AMA from hospital stay while getting IV antibiotics for endocarditis and epidural abscesses.  Returns today for recheck.  Patient is adamant that he does not want to be admitted to the hospital.  I spoke with Dr. Drucilla Schmidt with infectious disease on the options for this patient.  He reported that since patient's lab work looks better today we can try Augmentin twice daily for 6 weeks.  He advised that this is not as effective as IV antibiotics but if the  patient refuses admission we can try the p.o.  Patient reports he would like to try p.o.  I discussed with him the importance of returning to the emergency department if he has any new or worsening symptoms.  I discussed with him that the p.o. medication would not be as effective as IV antibiotics.  I discussed with him that we are getting blood cultures and if they are positive we will tell him to return to the hospital to be admitted.  Patient understands the risks of going home and the long-term consequences of his condition and IV drug use which could include worsening condition and death. He is capable of  making his own decisions at this time.   Also discussed with him that he screen positive for hepatitis C and will need to follow-up with GI specialist.  Patient reports that he understands.   [KM]    Clinical Course User Index [KM] Alveria Apley, PA-C        Final Clinical Impressions(s) / ED Diagnoses   Final diagnoses:  Subacute endocarditis, unspecified endocarditis type  IVDU (intravenous drug user)    ED Discharge Orders         Ordered    amoxicillin-clavulanate (AUGMENTIN) 875-125 MG tablet  Every 12 hours     01/24/19 1338           Kristine Royal 01/24/19 1350    Virgel Manifold, MD 01/25/19 1218

## 2019-01-24 NOTE — ED Notes (Signed)
Called pt.x2 no answer for blood work

## 2019-01-30 LAB — CULTURE, BLOOD (ROUTINE X 2)
Culture: NO GROWTH
Culture: NO GROWTH
Special Requests: ADEQUATE

## 2019-06-16 ENCOUNTER — Telehealth: Payer: Self-pay | Admitting: Pharmacy Technician

## 2019-06-16 NOTE — Telephone Encounter (Signed)
[facility] Patient Teacher, English as a foreign language completed.    The patient is uninsured (has family planning [place] which does not cover needed medication) and will need patient assistance for medication.  We can complete the application and will need to meet with the patient for signatures and income documentation.  Venida Jarvis. Nadara Mustard [facility] Phone: [phone] Fax:  [phone]

## 2019-06-19 ENCOUNTER — Other Ambulatory Visit: Payer: Self-pay

## 2019-06-19 ENCOUNTER — Ambulatory Visit (INDEPENDENT_AMBULATORY_CARE_PROVIDER_SITE_OTHER): Payer: Self-pay | Admitting: Family

## 2019-06-19 ENCOUNTER — Encounter: Payer: Self-pay | Admitting: Family

## 2019-06-19 VITALS — BP 135/91 | HR 105 | Temp 98.2°F | Ht 71.0 in | Wt 168.0 lb

## 2019-06-19 DIAGNOSIS — B182 Chronic viral hepatitis C: Secondary | ICD-10-CM | POA: Insufficient documentation

## 2019-06-19 NOTE — Patient Instructions (Addendum)
Nice to meet you.  We will check your blood work today and let you know the results.  Limit acetaminophen (Tylenol) usage to no more than 2 grams (2,000 mg) per day.  Avoid alcohol.  Do not share toothbrushes or razors.  Practice safe sex to protect against transmission as well as sexually transmitted disease.    Hepatitis C Hepatitis C is a viral infection of the liver. It can lead to scarring of the liver (cirrhosis), liver failure, or liver cancer. Hepatitis C may go undetected for months or years because people with the infection may not have symptoms, or they may have only mild symptoms. What are the causes? This condition is caused by the hepatitis C virus (HCV). The virus can spread from person to person (is contagious) through:  Blood.  Childbirth. A woman who has hepatitis C can pass it to her baby during birth.  Bodily fluids, such as breast milk, tears, semen, vaginal fluids, and saliva.  Blood transfusions or organ transplants done in the [place] before 1992.  What increases the risk? The following factors may make you more likely to develop this condition:  Having contact with unclean (contaminated) needles or syringes. This may result from: ? Acupuncture. ? Tattoing. ? Body piercing. ? Injecting drugs.  Having unprotected sex with someone who is infected.  Needing treatment to filter your blood (kidney dialysis).  Having HIV (human immunodeficiency virus) or AIDS (acquired immunodeficiency syndrome).  Working in a job that involves contact with blood or bodily fluids, such as health care.  What are the signs or symptoms? Symptoms of this condition include:  Fatigue.  Loss of appetite.  Nausea.  Vomiting.  Abdominal pain.  Dark yellow urine.  Yellowish skin and eyes (jaundice).  Itchy skin.  Clay-colored bowel movements.  Joint pain.  Bleeding and bruising easily.  Fluid building up in your stomach (ascites).  In some cases, you  may not have any symptoms. How is this diagnosed? This condition is diagnosed with:  Blood tests.  Other tests to check how well your liver is functioning. They may include: ? Magnetic resonance elastography (MRE). This imaging test uses MRIs and sound waves to measure liver stiffness. ? Transient elastography. This imaging test uses ultrasounds to measure liver stiffness. ? Liver biopsy. This test requires taking a small tissue sample from your liver to examine it under a microscope.  How is this treated? Your health care provider may perform noninvasive tests or a liver biopsy to help decide the best course of treatment. Treatment may include:  Antiviral medicines and other medicines.  Follow-up treatments every 6-12 months for infections or other liver conditions.  Receiving a donated liver (liver transplant).  Follow these instructions at home: Medicines  Take over-the-counter and prescription medicines only as told by your health care provider.  Take your antiviral medicine as told by your health care provider. Do not stop taking the antiviral even if you start to feel better.  Do not take any medicines unless approved by your health care provider, including over-the-counter medicines and birth control pills. Activity  Rest as needed.  Do not have sex unless approved by your health care provider.  Ask your health care provider when you may return to school or work. Eating and drinking  Eat a balanced diet with plenty of fruits and vegetables, whole grains, and lowfat (lean) meats or non-meat proteins (such as beans or tofu).  Drink enough fluids to keep your urine clear or pale yellow.  Do not drink alcohol. General instructions  Do not share toothbrushes, nail clippers, or razors.  Wash your hands frequently with soap and water. If soap and water are not available, use hand sanitizer.  Cover any cuts or open sores on your skin to prevent spreading the  virus.  Keep all follow-up visits as told by your health care provider. This is important. You may need follow-up visits every 6-12 months. How is this prevented? There is no vaccine for hepatitis C. The only way to prevent the disease is to reduce the risk of exposure to the virus. Make sure you:  Wash your hands frequently with soap and water. If soap and water are not available, use hand sanitizer.  Do not share needles or syringes.  Practice safe sex and use condoms.  Avoid handling blood or bodily fluids without gloves or other protection.  Avoid getting tattoos or piercings in shops or other locations that are not clean.  Contact a health care provider if:  You have a fever.  You develop abdominal pain.  You pass dark urine.  You pass clay-colored stools.  You develop joint pain. Get help right away if:  You have increasing fatigue or weakness.  You lose your appetite.  You cannot eat or drink without vomiting.  You develop jaundice or your jaundice gets worse.  You bruise or bleed easily. Summary  Hepatitis C is a viral infection of the liver. It can lead to scarring of the liver (cirrhosis), liver failure, or liver cancer.  The hepatitis C virus (HCV) causes this condition. The virus can pass from person to person (is contagious).  You should not take any medicines unless approved by your health care provider. This includes over-the-counter medicines and birth control pills. This information is not intended to replace advice given to you by your health care provider. Make sure you discuss any questions you have with your health care provider. Document Released: 03/31/2000 Document Revised: 05/09/2016 Document Reviewed: 05/09/2016 Elsevier Interactive Patient Education  [employer].

## 2019-06-19 NOTE — Telephone Encounter (Addendum)
[facility] Patient Advocate Encounter  Explained the medication process with the patient and had him fill out and sign both applications for assistance. He is currently collecting unemployment but that is set to end 06/28/2019. HH1 and $0 after that.  Will submit once labs return and a medication is selected.  Application submitted to [employer] to be processed for [employer]

## 2019-06-19 NOTE — Assessment & Plan Note (Signed)
Bradley Weber is a 44 year old male with genotype 3 chronic hepatitis C with initial viral load of 6.6 million.  He is treatment nave and asymptomatic.  Biggest risk factor for acquiring hepatitis C is IV drug use.  He has been sober from IV drugs for over 6 months.  We discussed the pathogenesis, transmission, prevention, risks if left untreated, and treatments for hepatitis C.  Check blood work today including hepatitis B status, HIV status, and updated hepatitis C lab work.  He will need financial assistance as he is currently uninsured.  Pharmacy staff met with him to complete medication assistance paperwork.  Plan for follow-up and medication determination pending blood work results.

## 2019-06-19 NOTE — Progress Notes (Signed)
Subjective:    Patient ID: Bradley Weber, male    DOB: 09/06/1975, 44 y.o.   MRN: [id number]  Chief Complaint  Patient presents with  . New Patient (Initial Visit)    HepC--- requesting for GI referral    HPI:  Bradley Weber is a 44 y.o. male with previous medical history of basal cell carcinoma, hepatitis C, MRSA infection with endocarditis, and ADD presenting today for evaluation and treatment of hepatitis C.  Bradley Weber was recently admitted to the hospital for MRSA bacteremia complicated with endocarditis and found to have a positive hepatitis C antibody test.  Viral load was 6.6 million with genotype 3.  Risk factors for acquiring hepatitis C include IV drug use.  Denies blood transfusions prior to 1992, sharing of razors or toothbrushes, or sexual contact with a known positive partner.  No previous or family history of liver disease.  He is not been treated to date.  Currently asymptomatic and denies nausea, vomiting, scleral icterus, or jaundice.  No current recreational or illicit drug use and has been sober from IV drugs for approximately 6 months.  He does smoke 1/2 pack of cigarettes per day on average with occasional alcohol intake.   No Known Allergies    Outpatient Medications Prior to Visit  Medication Sig Dispense Refill  . ibuprofen (ADVIL) 200 MG tablet Take 200 mg by mouth every 6 (six) hours as needed for moderate pain.     No facility-administered medications prior to visit.     Past Medical History:  Diagnosis Date  . ADD (attention deficit disorder)   . Basal cell carcinoma   . Chronic headaches   . Hepatitis C   . Kidney stone   . MRSA (methicillin resistant Staphylococcus aureus)       Past Surgical History:  Procedure Laterality Date  . MOUTH SURGERY    . MRSA cyst excision     chest  . SKIN CANCER EXCISION    . TEE WITHOUT CARDIOVERSION N/A 12/27/2018   Procedure: TRANSESOPHAGEAL ECHOCARDIOGRAM (TEE);  Surgeon: Lelon Perla, MD;   Location: [facility] ENDOSCOPY;  Service: Cardiovascular;  Laterality: N/A;      Family History  Problem Relation Age of Onset  . Heart attack Father   . Pancreatic cancer Maternal Grandmother   . Diabetes Maternal Grandmother       Social History   Socioeconomic History  . Marital status: Married    Spouse name: Not on file  . Number of children: 2  . Years of education: Not on file  . Highest education level: Not on file  Occupational History    Employer: [employer]  Tobacco Use  . Smoking status: Current Every Day Smoker    Packs/day: 0.50  . Smokeless tobacco: Never Used  Substance and Sexual Activity  . Alcohol use: No    Comment: occasionally  . Drug use: Not Currently    Types: Oxycodone, IV, Heroin    Comment: last used 6 mos ago.   Marland Kitchen Sexual activity: Yes    Birth control/protection: None  Other Topics Concern  . Not on file  Social History Narrative   Leaving with friend (roommate)   Smoke less than half a pack a day   Social Determinants of Health   Financial Resource Strain:   . Difficulty of Paying Living Expenses: Not on file  Food Insecurity:   . Worried About Charity fundraiser in the Last Year: Not on file  . Ran  Out of Food in the Last Year: Not on file  Transportation Needs:   . Lack of Transportation (Medical): Not on file  . Lack of Transportation (Non-Medical): Not on file  Physical Activity:   . Days of Exercise per Week: Not on file  . Minutes of Exercise per Session: Not on file  Stress:   . Feeling of Stress : Not on file  Social Connections:   . Frequency of Communication with Friends and Family: Not on file  . Frequency of Social Gatherings with Friends and Family: Not on file  . Attends Religious Services: Not on file  . Active Member of Clubs or Organizations: Not on file  . Attends Archivist Meetings: Not on file  . Marital Status: Not on file  Intimate Partner Violence:   . Fear of Current or Ex-Partner: Not on  file  . Emotionally Abused: Not on file  . Physically Abused: Not on file  . Sexually Abused: Not on file      Review of Systems  Constitutional: Negative for chills, diaphoresis, fatigue and fever.  Respiratory: Negative for cough, chest tightness, shortness of breath and wheezing.   Cardiovascular: Negative for chest pain.  Gastrointestinal: Negative for abdominal distention, abdominal pain, constipation, diarrhea, nausea and vomiting.  Neurological: Negative for weakness and headaches.  Hematological: Does not bruise/bleed easily.       Objective:    BP (!) 135/91   Pulse (!) 105   Temp 98.2 F (36.8 C)   Ht _0  (1.803 m)   Wt 168 lb (76.2 kg)   SpO2 98%   BMI 23.43 kg/m  Nursing note and vital signs reviewed.  Physical Exam Constitutional:      General: He is not in acute distress.    Appearance: He is well-developed.  Cardiovascular:     Rate and Rhythm: Normal rate and regular rhythm.     Heart sounds: Normal heart sounds. No murmur. No friction rub. No gallop.   Pulmonary:     Effort: Pulmonary effort is normal. No respiratory distress.     Breath sounds: Normal breath sounds. No wheezing or rales.  Chest:     Chest wall: No tenderness.  Abdominal:     General: Bowel sounds are normal. There is no distension.     Palpations: Abdomen is soft. There is no mass.     Tenderness: There is no abdominal tenderness. There is no guarding or rebound.  Skin:    General: Skin is warm and dry.  Neurological:     Mental Status: He is alert and oriented to person, place, and time.  Psychiatric:        Behavior: Behavior normal.        Thought Content: Thought content normal.        Judgment: Judgment normal.         Assessment & Plan:   Patient Active Problem List   Diagnosis Date Noted  . Chronic hepatitis C without hepatic coma ([place]) 06/19/2019  . AKI (acute kidney injury) ([place]) 12/20/2018  . Transaminitis 12/20/2018  . Community acquired pneumonia  12/19/2018  . Spinal epidural abscess   . Abdominal pain, epigastric 01/20/2011     Problem List Items Addressed This Visit      Digestive   Chronic hepatitis C without hepatic coma ([place]) - Primary    Bradley Weber is a 44 year old male with genotype 3 chronic hepatitis C with initial viral load of 6.6 million.  He is treatment  nave and asymptomatic.  Biggest risk factor for acquiring hepatitis C is IV drug use.  He has been sober from IV drugs for over 6 months.  We discussed the pathogenesis, transmission, prevention, risks if left untreated, and treatments for hepatitis C.  Check blood work today including hepatitis B status, HIV status, and updated hepatitis C lab work.  He will need financial assistance as he is currently uninsured.  Pharmacy staff met with him to complete medication assistance paperwork.  Plan for follow-up and medication determination pending blood work results.      Relevant Orders   Hepatitis B surface antibody,qualitative   Hepatitis B surface antigen   Hepatitis C RNA quantitative   Liver Fibrosis, FibroTest-ActiTest   Protime-INR   Hepatic function panel   CBC   HIV antibody (with reflex)       I am having Bradley Weber maintain his ibuprofen.   Follow-up: Return if symptoms worsen or fail to improve.    Terri Piedra, MSN, FNP-C Nurse Practitioner [facility] for Infectious Disease [place] number: [phone]

## 2019-06-24 LAB — HEPATIC FUNCTION PANEL
AG Ratio: 1.3 (calc) (ref 1.0–2.5)
ALT: 110 U/L — ABNORMAL HIGH (ref 9–46)
AST: 68 U/L — ABNORMAL HIGH (ref 10–40)
Albumin: 4.3 g/dL (ref 3.6–5.1)
Alkaline phosphatase (APISO): 67 U/L (ref 36–130)
Bilirubin, Direct: 0.1 mg/dL (ref 0.0–0.2)
Globulin: 3.2 g/dL (calc) (ref 1.9–3.7)
Indirect Bilirubin: 0.4 mg/dL (calc) (ref 0.2–1.2)
Total Bilirubin: 0.5 mg/dL (ref 0.2–1.2)
Total Protein: 7.5 g/dL (ref 6.1–8.1)

## 2019-06-24 LAB — LIVER FIBROSIS, FIBROTEST-ACTITEST
ALT: 117 U/L — ABNORMAL HIGH (ref 9–46)
Alpha-2-Macroglobulin: 171 mg/dL (ref 106–279)
Apolipoprotein A1: 127 mg/dL (ref 94–176)
Bilirubin: 0.4 mg/dL (ref 0.2–1.2)
Fibrosis Score: 0.23
GGT: 59 U/L (ref 3–95)
Haptoglobin: 95 mg/dL (ref 43–212)
Necroinflammat ACT Score: 0.61
Reference ID: 3297773

## 2019-06-24 LAB — HEPATITIS B SURFACE ANTIGEN: Hepatitis B Surface Ag: NONREACTIVE

## 2019-06-24 LAB — CBC
HCT: 45.2 % (ref 38.5–50.0)
Hemoglobin: 15 g/dL (ref 13.2–17.1)
MCH: 27.6 pg (ref 27.0–33.0)
MCHC: 33.2 g/dL (ref 32.0–36.0)
MCV: 83.1 fL (ref 80.0–100.0)
MPV: 12.6 fL — ABNORMAL HIGH (ref 7.5–12.5)
Platelets: 222 10*3/uL (ref 140–400)
RBC: 5.44 10*6/uL (ref 4.20–5.80)
RDW: 15 % (ref 11.0–15.0)
WBC: 6.8 10*3/uL (ref 3.8–10.8)

## 2019-06-24 LAB — PROTIME-INR
INR: 0.9
Prothrombin Time: 9.9 s (ref 9.0–11.5)

## 2019-06-24 LAB — HEPATITIS C RNA QUANTITATIVE
HCV Quantitative Log: 6.45 Log IU/mL — ABNORMAL HIGH
HCV RNA, PCR, QN: 2830000 IU/mL — ABNORMAL HIGH

## 2019-06-24 LAB — HIV ANTIBODY (ROUTINE TESTING W REFLEX): HIV 1&2 Ab, 4th Generation: NONREACTIVE

## 2019-06-24 LAB — HEPATITIS B SURFACE ANTIBODY,QUALITATIVE: Hep B S Ab: NONREACTIVE

## 2019-06-25 ENCOUNTER — Other Ambulatory Visit: Payer: Self-pay | Admitting: Pharmacist

## 2019-06-25 DIAGNOSIS — B182 Chronic viral hepatitis C: Secondary | ICD-10-CM

## 2019-06-25 MED ORDER — SOFOSBUVIR-VELPATASVIR 400-100 MG PO TABS: 1.0000 | ORAL_TABLET | Freq: Every day | ORAL | 2 refills | Status: DC

## 2019-06-25 NOTE — Progress Notes (Signed)
Will send in Epclusa x 12 weeks for patient's Hep C infection. Adonis Brook will fax in patient assistance application.

## 2019-07-16 ENCOUNTER — Other Ambulatory Visit: Payer: Self-pay | Admitting: Family

## 2019-07-16 DIAGNOSIS — B182 Chronic viral hepatitis C: Secondary | ICD-10-CM

## 2019-07-16 MED ORDER — MAVYRET 100-40 MG PO TABS: 3.0000 | ORAL_TABLET | Freq: Every day | ORAL | 1 refills | Status: DC

## 2019-07-21 NOTE — Progress Notes (Signed)
Will submit the application for [place] to [employer]. Patient is aware of the process involved in getting his medication. Will call and update once the application is approved.

## 2019-07-22 ENCOUNTER — Telehealth: Payer: Self-pay

## 2019-07-22 NOTE — Telephone Encounter (Signed)
Called the patient back and provided him the number to [facility] pharmacy to set up shipment.

## 2019-07-22 NOTE — Telephone Encounter (Signed)
Received call from patient requesting lab results and updates about medication. Results reviewed with patient. Let him know that RN would reach out to pharmacy team for medication updates.  Informed patient that results were sent to his MyChart however he states that he did not see it and believes his ex-wife has created an account in his name. Provided patient with his username and offered to deactivate that account for privacy purposes however he refused.   Ples Trudel Lorita Officer, RN

## 2019-07-24 ENCOUNTER — Telehealth: Payer: Self-pay | Admitting: Pharmacy Technician

## 2019-07-24 NOTE — Telephone Encounter (Addendum)
[facility] Patient Advocate Encounter   Was successful in approving the patient for [employer] for [employer]. They are requiring further HCV prescription form to be sent via fax. Will get this filled out and faxed back.  This authorization is 07/25/2019 until 10/17/2019 [employer] will reach out  Bartholomew Crews, [place] Patient [facility] for Infectious Disease Phone: [phone] Fax: [phone] 07/24/2019 12:09 PM

## 2019-07-30 ENCOUNTER — Encounter: Payer: Self-pay | Admitting: Pharmacy Technician

## 2019-07-31 ENCOUNTER — Telehealth: Payer: Self-pay

## 2019-07-31 DIAGNOSIS — B182 Chronic viral hepatitis C: Secondary | ICD-10-CM

## 2019-07-31 MED ORDER — SOFOSBUVIR-VELPATASVIR 400-100 MG PO TABS: 1.0000 | ORAL_TABLET | Freq: Every day | ORAL | 2 refills | Status: DC

## 2019-07-31 NOTE — Telephone Encounter (Signed)
Attempted to call patient to counsel about Epclusa.  4/13 - No answer, left voicemail 4/14 - Someone answered phone, left call back number with them

## 2019-08-08 ENCOUNTER — Telehealth: Payer: Self-pay

## 2019-08-08 NOTE — Telephone Encounter (Signed)
Attempted to call to counsel about Epclusa - no answer, left voicemail.

## 2019-08-25 ENCOUNTER — Telehealth: Payer: Self-pay | Admitting: Pharmacy Technician

## 2019-08-25 NOTE — Telephone Encounter (Signed)
[facility] Patient Advocate Encounter  Received notification from [facility] that they are unable to reach the patient to set up his second shipment of medication.  07/30/2019 delivery confirmation from [facility]. Patient began 08/01/2019.  Attempted to call the patient this morning but had to leave a voicemail. We will need to provide him the number to [facility] [phone] so he can call them and request the medication. The pharmacy said due to multiple failed attempts to reach the patient, they will no longer be reaching out.

## 2019-09-01 ENCOUNTER — Ambulatory Visit: Payer: Medicaid Other | Admitting: Pharmacist

## 2021-04-05 IMAGING — CT CT IMAGE GUIDED DRAINAGE BY PERCUTANEOUS CATHETER
1 of 3 series · 14 of 32 positions shown, 19 images · non-contrast
Comparison: Lumbar spine CT-12/19/2018;

INDICATION: Concern for septic arthritis involving the right-sided L4-L5 facet.
Please perform image guided aspiration for diagnostic purposes.

EXAM:
CT-GUIDED ASPIRATION OF THE RIGHT L4-L5 FACET

[Series 2: i-spiral 5.0 b40f · axial · 0.83mm/px · z∈[+920,+1158]mm · 14 of 76 slices shown, 19 images]
[im 4/76  soft-tissue]
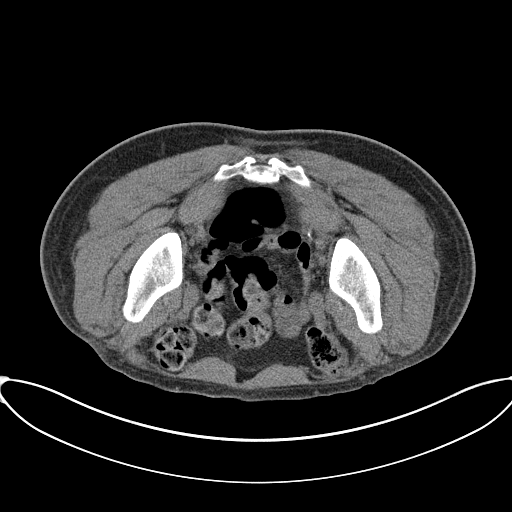
[im 4/76  bone]
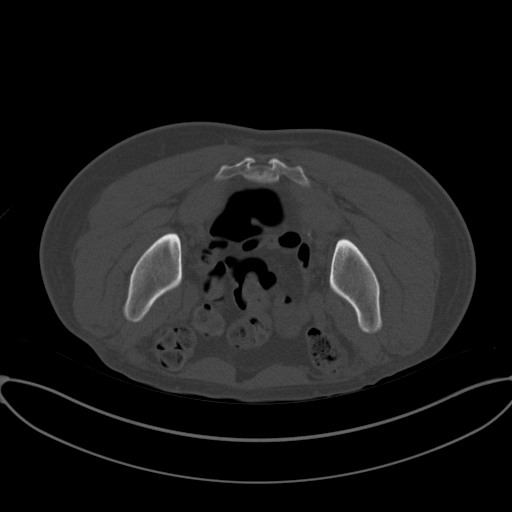
[im 12/76  soft-tissue]
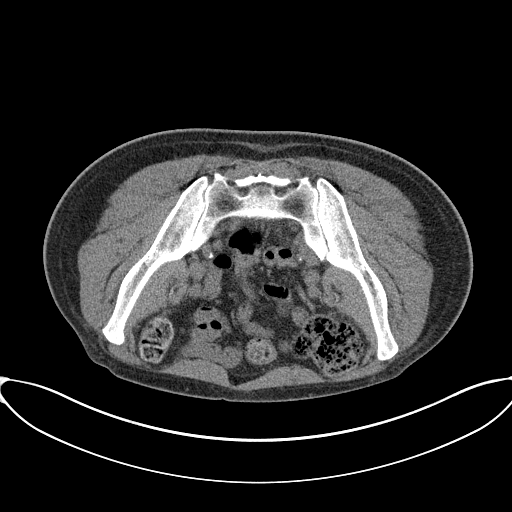
[im 16/76  soft-tissue]
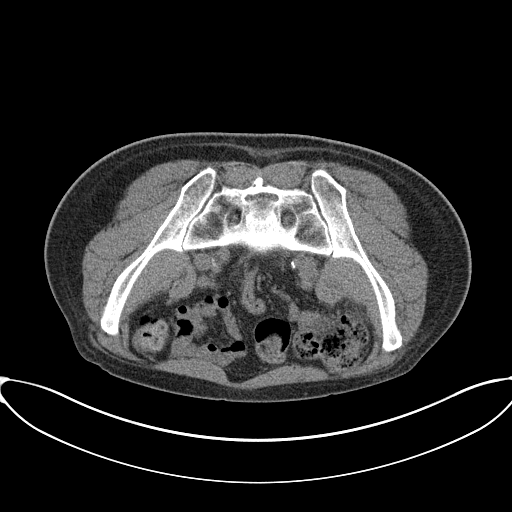
[im 23/76  soft-tissue]
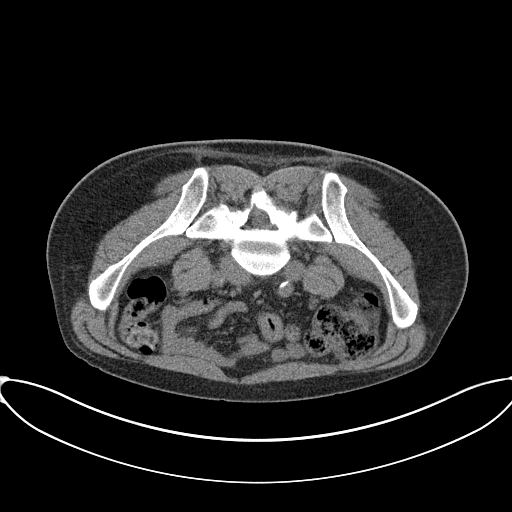
[im 27/76  soft-tissue]
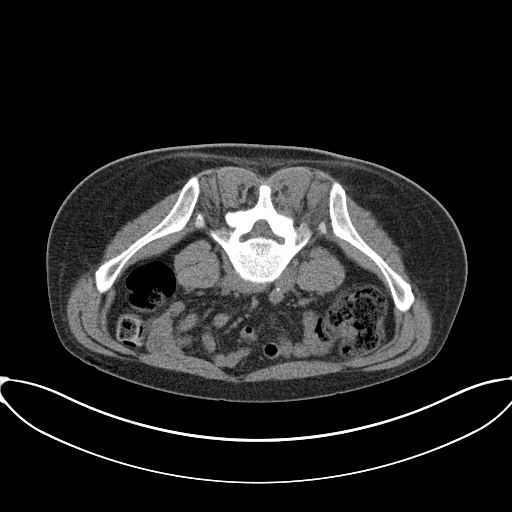
[im 34/76  soft-tissue]
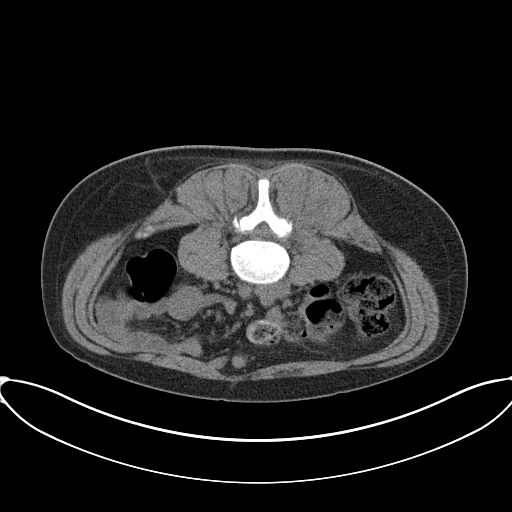
[im 38/76  soft-tissue]
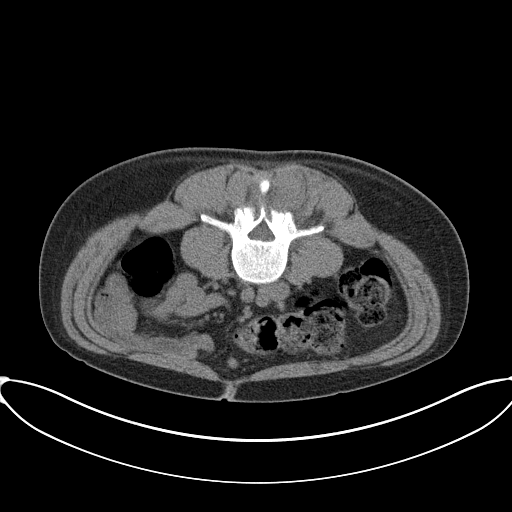
[im 42/76  soft-tissue]
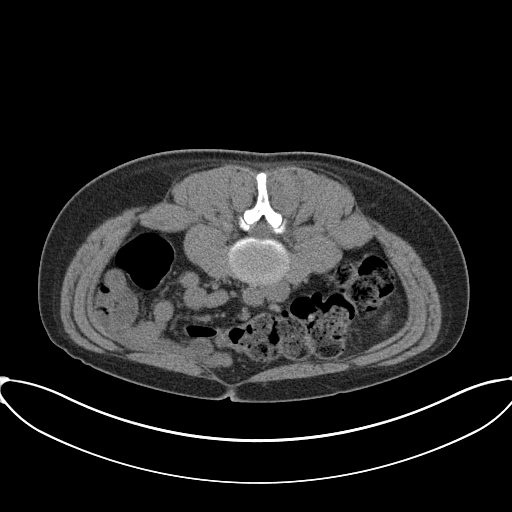
[im 49/76  soft-tissue]
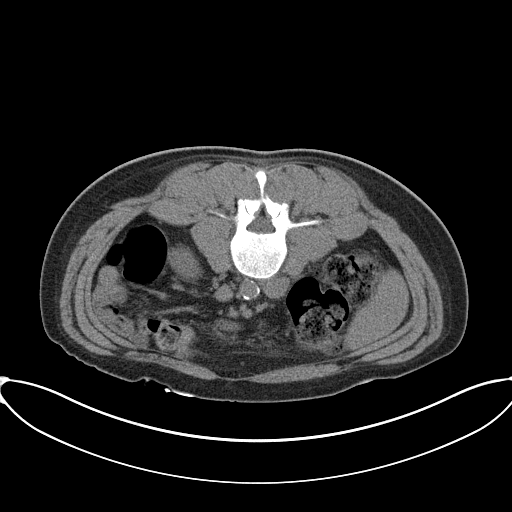
[im 49/76  bone]
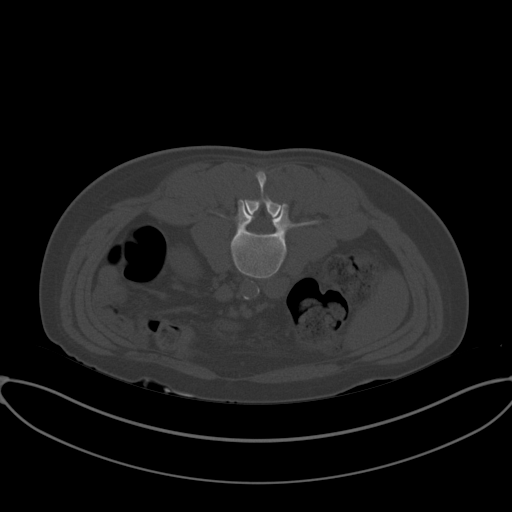
[im 53/76  soft-tissue]
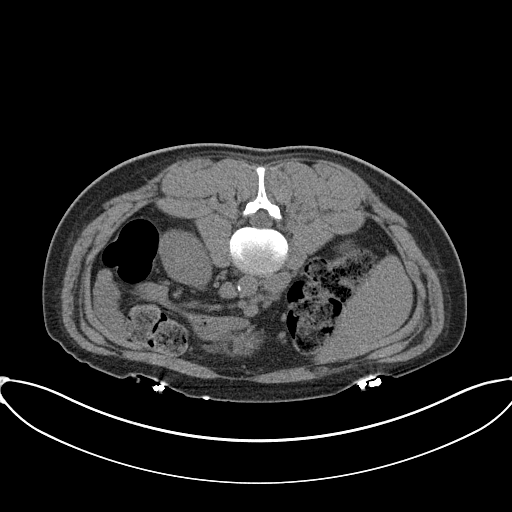
[im 61/76  soft-tissue]
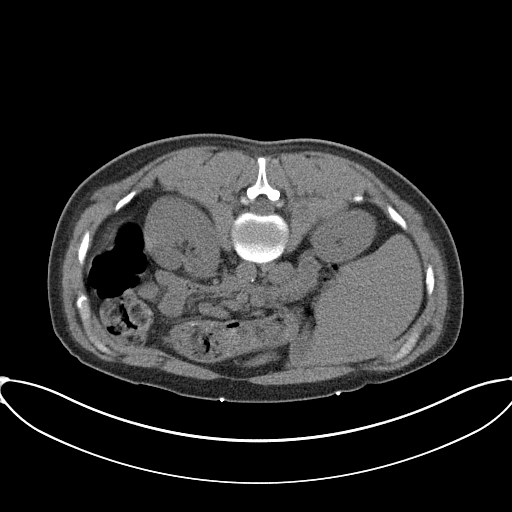
[im 61/76  lung]
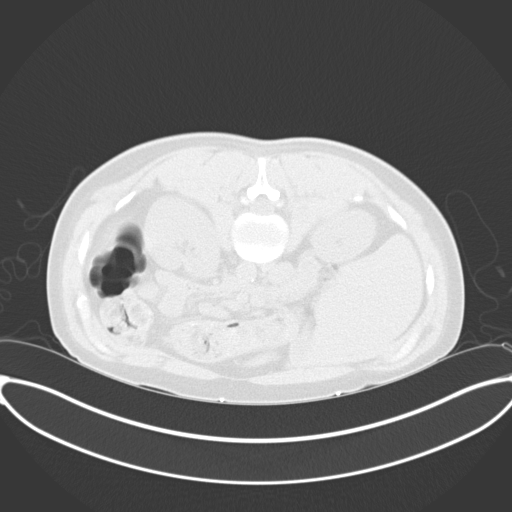
[im 64/76  soft-tissue]
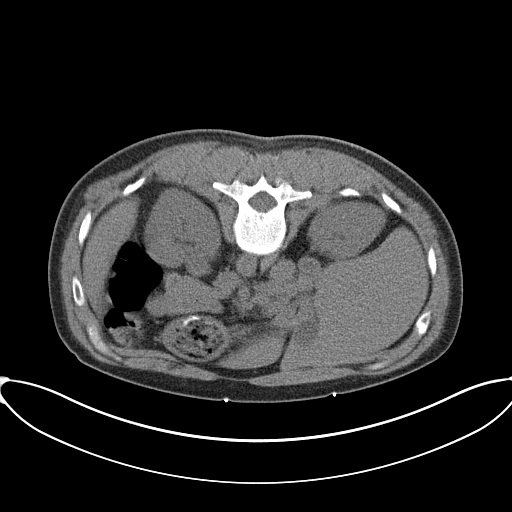
[im 64/76  lung]
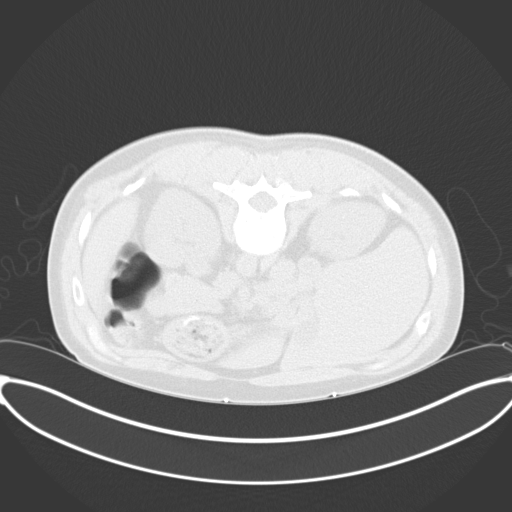
[im 68/76  lung]
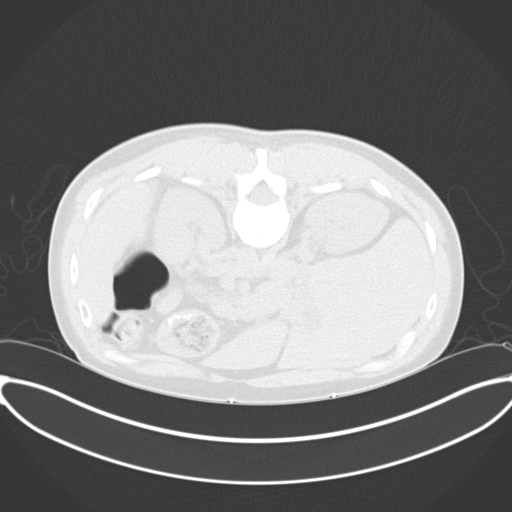
[im 72/76  soft-tissue]
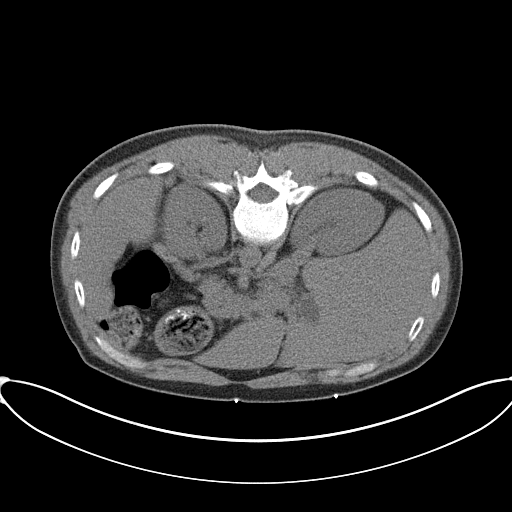
[im 72/76  lung]
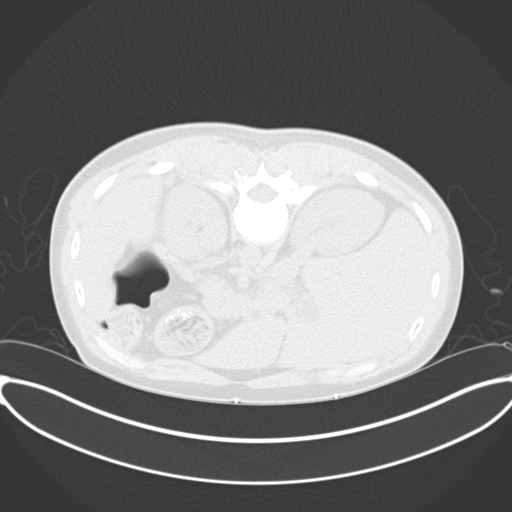

[14 of 32 positions shown; findings below may reference images not displayed]

lumbar spine MRI-12/19/2018

MEDICATIONS:
The patient is currently admitted to the hospital and receiving
intravenous antibiotics. The antibiotics were administered within an
appropriate time frame prior to the initiation of the procedure.

ANESTHESIA/SEDATION:
None

CONTRAST:  None

COMPLICATIONS:
None immediate.

PROCEDURE:
Informed written consent was obtained from the patient after a
discussion of the risks, benefits and alternatives to treatment. The
patient was placed prone on the CT gantry and a pre procedural CT
was performed identifying the right L4-L5 facet at the location of
questioned adjacent abscess on preceding lumbar spine MRI.

The procedure was planned. A timeout was performed prior to the
initiation of the procedure.

The skin overlying the lower back was prepped and draped in the
usual sterile fashion. The overlying soft tissues were anesthetized
with 1% lidocaine with epinephrine. Appropriate trajectory was
planned with the use of a 22 gauge spinal needle. An 18 gauge trocar
needle was advanced adjacent to and about the caudal aspect of the
right L4-L5 facet. Despite appropriate needle positioning, no fluid
was able to be aspirated at this location. As such, approximately 3
cc of saline was instilled and subsequently aspirated.

The needle was removed intact. All aspirated fluid was capped and
sent to the laboratory for analysis. The patient tolerated the
procedure well without immediate post procedural complication.
IMPRESSION: Technically successful CT guided lavage aspiration of the right
L4-L5 facet.

## 2021-05-24 ENCOUNTER — Emergency Department (HOSPITAL_COMMUNITY)
Admission: EM | Admit: 2021-05-24 | Discharge: 2021-05-25 | Disposition: A | Discharge status: Home / Self Care | Payer: Self-pay | Attending: Emergency Medicine | Admitting: Emergency Medicine

## 2021-05-24 DIAGNOSIS — X58XXXA Exposure to other specified factors, initial encounter: Secondary | ICD-10-CM | POA: Insufficient documentation

## 2021-05-24 DIAGNOSIS — R197 Diarrhea, unspecified: Secondary | ICD-10-CM | POA: Insufficient documentation

## 2021-05-24 DIAGNOSIS — R519 Headache, unspecified: Secondary | ICD-10-CM | POA: Insufficient documentation

## 2021-05-24 DIAGNOSIS — Z7982 Long term (current) use of aspirin: Secondary | ICD-10-CM | POA: Insufficient documentation

## 2021-05-24 DIAGNOSIS — R112 Nausea with vomiting, unspecified: Secondary | ICD-10-CM | POA: Insufficient documentation

## 2021-05-24 DIAGNOSIS — Z85828 Personal history of other malignant neoplasm of skin: Secondary | ICD-10-CM | POA: Insufficient documentation

## 2021-05-24 DIAGNOSIS — S0100XA Unspecified open wound of scalp, initial encounter: Secondary | ICD-10-CM | POA: Insufficient documentation

## 2021-05-24 DIAGNOSIS — R5383 Other fatigue: Secondary | ICD-10-CM | POA: Insufficient documentation

## 2021-05-24 LAB — CBC WITH DIFFERENTIAL/PLATELET
Abs Immature Granulocytes: 0.03 10*3/uL (ref 0.00–0.07)
Basophils Absolute: 0 10*3/uL (ref 0.0–0.1)
Basophils Relative: 0 %
Eosinophils Absolute: 0.2 10*3/uL (ref 0.0–0.5)
Eosinophils Relative: 2 %
HCT: 45.9 % (ref 39.0–52.0)
Hemoglobin: 15.2 g/dL (ref 13.0–17.0)
Immature Granulocytes: 0 %
Lymphocytes Relative: 32 %
Lymphs Abs: 2.8 10*3/uL (ref 0.7–4.0)
MCH: 27.8 pg (ref 26.0–34.0)
MCHC: 33.1 g/dL (ref 30.0–36.0)
MCV: 83.9 fL (ref 80.0–100.0)
Monocytes Absolute: 0.9 10*3/uL (ref 0.1–1.0)
Monocytes Relative: 10 %
Neutro Abs: 5 10*3/uL (ref 1.7–7.7)
Neutrophils Relative %: 56 %
Platelets: 239 10*3/uL (ref 150–400)
RBC: 5.47 MIL/uL (ref 4.22–5.81)
RDW: 12.7 % (ref 11.5–15.5)
WBC: 8.9 10*3/uL (ref 4.0–10.5)
nRBC: 0 % (ref 0.0–0.2)

## 2021-05-24 LAB — COMPREHENSIVE METABOLIC PANEL
ALT: 111 U/L — ABNORMAL HIGH (ref 0–44)
AST: 69 U/L — ABNORMAL HIGH (ref 15–41)
Albumin: 3.8 g/dL (ref 3.5–5.0)
Alkaline Phosphatase: 47 U/L (ref 38–126)
Anion gap: 8 (ref 5–15)
BUN: 13 mg/dL (ref 6–20)
CO2: 27 mmol/L (ref 22–32)
Calcium: 9.3 mg/dL (ref 8.9–10.3)
Chloride: 103 mmol/L (ref 98–111)
Creatinine, Ser: 0.83 mg/dL (ref 0.61–1.24)
GFR, Estimated: >60 mL/min (ref >60)
Glucose, Bld: 91 mg/dL (ref 70–99)
Potassium: 4.4 mmol/L (ref 3.5–5.1)
Sodium: 138 mmol/L (ref 135–145)
Total Bilirubin: 0.7 mg/dL (ref 0.3–1.2)
Total Protein: 8 g/dL (ref 6.5–8.1)

## 2021-05-24 LAB — URINALYSIS, ROUTINE W REFLEX MICROSCOPIC
Bilirubin Urine: NEGATIVE
Glucose, UA: NEGATIVE mg/dL
Hgb urine dipstick: NEGATIVE
Ketones, ur: NEGATIVE mg/dL
Leukocytes,Ua: NEGATIVE
Nitrite: NEGATIVE
Protein, ur: NEGATIVE mg/dL
Specific Gravity, Urine: 1.027 (ref 1.005–1.030)
pH: 5 (ref 5.0–8.0)

## 2021-05-24 LAB — LIPASE, BLOOD: Lipase: 24 U/L (ref 11–51)

## 2021-05-24 MED ORDER — ONDANSETRON 4 MG PO TBDP
4.0000 mg | ORAL_TABLET | Freq: Once | ORAL | Status: DC
  Filled 2021-05-24: qty 1

## 2021-05-24 NOTE — ED Triage Notes (Incomplete)
Pt c/o nausea/vomiting, CP, lethargy/ dizziness, "equilibrium feels off" x5 days. Decreased urinary output. Unable to tolerate PO but voiced attempts to stay properly hydrated. Concerned for sepsis, hx of same. Denies fevers, SHOB

## 2021-05-24 NOTE — ED Provider Triage Note (Signed)
Emergency Medicine Provider Triage Evaluation Note  Bradley Weber , a 46 y.o. male  was evaluated in triage.  Pt complains of nausea, vomiting, and generalized fatigue.  Symptoms have been present over the last 2 days.  Patient also endorses associated chills.  Patient endorses IV drug use.  States that he last used drugs on Friday 2/3  Review of Systems  Positive: Nausea, vomiting, chills, generalized fatigue Negative: Abdominal pain, constipation, diarrhea, blood in stool, dysuria, hematuria, urinary urgency,  Physical Exam  BP (!) 120/97 (BP Location: Right Arm)    Pulse (!) 109    Temp 98.2 F (36.8 C) (Oral)    Resp 16    SpO2 96%  Gen:   Awake, no distress   Resp:  Normal effort  MSK:   Moves extremities without difficulty  Other:  Abdomen soft, nondistended, nontender, no guarding or rebound tenderness.  Medical Decision Making  Medically screening exam initiated at 6:32 PM.  Appropriate orders placed.  Bradley Weber was informed that the remainder of the evaluation will be completed by another provider, this initial triage assessment does not replace that evaluation, and the importance of remaining in the ED until their evaluation is complete.     Bradley Weber, [place] 05/24/21 [phone]

## 2021-05-25 ENCOUNTER — Emergency Department (HOSPITAL_COMMUNITY): Payer: Self-pay

## 2021-05-25 ENCOUNTER — Encounter (HOSPITAL_COMMUNITY): Payer: Self-pay | Admitting: Emergency Medicine

## 2021-05-25 LAB — TSH: TSH: 3.038 u[IU]/mL (ref 0.350–4.500)

## 2021-05-25 LAB — TROPONIN I (HIGH SENSITIVITY)
Troponin I (High Sensitivity): <2 ng/L (ref <18)
Troponin I (High Sensitivity): <2 ng/L (ref <18)

## 2021-05-25 MED ORDER — IOHEXOL 300 MG/ML  SOLN
100.0000 mL | Freq: Once | INTRAMUSCULAR | Status: AC | PRN
  Administered 2021-05-25: 100 mL via INTRAVENOUS

## 2021-05-25 NOTE — Discharge Instructions (Addendum)
As discussed, there was no signs of sepsis on today's visit. We discussed doing blood cultures anyways do to your hx of bacteremia secondary to IV drug use and current IV drug use but you declined. I think this is reasonable since your vitals and workup has been so stable today. Please return to the ED for signs of infection including worsening nausea, vomiting, or development of fevers.  Please follow up with dermatologist for non-healing wound on scalp for bx for concerns for basal cell carnioma

## 2021-05-25 NOTE — ED Provider Notes (Signed)
[facility] EMERGENCY DEPARTMENT Provider Note   CSN: [id number] Arrival date & time: 05/24/21  1746     History  Chief Complaint  Patient presents with   Nausea   Emesis   Fatigue    Bradley Weber is a 46 y.o. male.  Pt is a 46 yo male with pmh of bacteriemia from ivd use, last use Friday-heroine, presenting for nausea, vomiting, and fatigue. Pt admits to fatigue x 5 days, with around 12 episodes every other day. Admits to headache and worsening fatigue. Admits to episode of diarrhea yesterday. Denies fevers, chills, or coughing. Has hx of basal cell carcinoma with non healing wound on scalp. No wounds at injection sites. Current smoker with no imaging of lungs since 2006. Has family hx of pancreatic cancer.   The history is provided by the patient. No language interpreter was used.  Emesis Associated symptoms: diarrhea   Associated symptoms: no abdominal pain, no arthralgias, no chills, no cough, no fever and no sore throat       Home Medications Prior to Admission medications   Medication Sig Start Date End Date Taking? Authorizing Provider  Aspirin-Acetaminophen-Caffeine (GOODY HEADACHE PO) Take 1 Package by mouth daily as needed (HA/Pain).   Yes [provider]  ibuprofen (ADVIL) 200 MG tablet Take 200 mg by mouth every 6 (six) hours as needed for moderate pain.   Yes [provider]      Allergies    Patient has no known allergies.    Review of Systems   Review of Systems  Constitutional:  Positive for fatigue. Negative for chills and fever.  HENT:  Negative for ear pain and sore throat.   Eyes:  Negative for pain and visual disturbance.  Respiratory:  Negative for cough and shortness of breath.   Cardiovascular:  Negative for chest pain and palpitations.  Gastrointestinal:  Positive for diarrhea, nausea and vomiting. Negative for abdominal pain.  Genitourinary:  Negative for dysuria and hematuria.  Musculoskeletal:  Negative  for arthralgias and back pain.  Skin:  Negative for color change and rash.  Neurological:  Negative for seizures and syncope.  All other systems reviewed and are negative.  Physical Exam Updated Vital Signs BP 116/80 (BP Location: Left Arm)    Pulse 91    Temp 97.6 F (36.4 C) (Oral)    Resp 16    SpO2 100%  Physical Exam Vitals and nursing note reviewed.  Constitutional:      General: He is not in acute distress.    Appearance: He is well-developed.  HENT:     Head: Normocephalic and atraumatic.   Eyes:     Conjunctiva/sclera: Conjunctivae normal.  Cardiovascular:     Rate and Rhythm: Normal rate and regular rhythm.     Heart sounds: No murmur heard. Pulmonary:     Effort: Pulmonary effort is normal. No respiratory distress.     Breath sounds: Normal breath sounds.  Abdominal:     Palpations: Abdomen is soft.     Tenderness: There is no abdominal tenderness.  Musculoskeletal:        General: No swelling.     Cervical back: Neck supple.  Skin:    General: Skin is warm and dry.     Capillary Refill: Capillary refill takes less than 2 seconds.  Neurological:     Mental Status: He is alert.  Psychiatric:        Mood and Affect: Mood normal.    ED Results /  Procedures / Treatments   Labs (all labs ordered are listed, but only abnormal results are displayed) Labs Reviewed  COMPREHENSIVE METABOLIC PANEL - Abnormal; Notable for the following components:      Result Value   AST 69 (*)    ALT 111 (*)    All other components within normal limits  URINALYSIS, ROUTINE W REFLEX MICROSCOPIC - Abnormal; Notable for the following components:   Color, Urine AMBER (*)    APPearance HAZY (*)    All other components within normal limits  CBC WITH DIFFERENTIAL/PLATELET  LIPASE, BLOOD  TSH  TROPONIN I (HIGH SENSITIVITY)    EKG None  Radiology No results found.  Procedures Procedures    Medications Ordered in ED Medications  ondansetron (ZOFRAN-ODT) disintegrating  tablet 4 mg (has no administration in time range)    ED Course/ Medical Decision Making/ A&P                           Medical Decision Making Amount and/or Complexity of Data Reviewed Labs: ordered. Radiology: ordered. ECG/medicine tests: ordered.  Risk Prescription drug management.   11:50 AM 46 yo male with pmh of bacteriemia from ivd use, last use Friday-heroine, presenting for nausea, vomiting, and fatigue. Pt is Aox3, no acute distress, afebrile, with stable vitals. Physical exam demonstrates soft non tender abdomen.   Fatigue: -hx of n/v/d x 5 days. Consider viral gastroenteritis.  -CT scan demonstrates on acute process. No diverticulitis. No pancreatitis. Hx of fatigue and family hx of pancreatic cancer, current smoker. No pancreatic masses. CT demonstrates no suspicious ung masses.  -No hx bleeding. No anemia -stable electrolytes -no profound dehydration -TSH wnl -no hypoxia -Stable vitals. No leukocytosis.  No sirs criteria. No sepsis. Hx of IVDU with previous admission for bacteremia. Pt is still IV drug user. Last use Friday. Withdrawal considered but pt states he has been in withdrawal and this isn't it. Pt is not anxious or diaphoretic. No active vomiting. Getting blood cx without s/s sepsis due to hx discussed with pt and declined.   Of note, pt has hx basal cell carcinoma on face with non-healing wound on scalp "for years". F/u instructions with dermatology given for bx of head wound. No cellulitis.   Patient in no distress and low concern for any acutely life threatening etiologies of fatigue/generalized weakness. Symptoms likely secondary to n/v/d from gastroenteritis. Detailed discussions were had with the patient regarding current findings, and need for close f/u with PCP or on call doctor. The patient has been instructed to return immediately if the symptoms worsen in any way for re-evaluation. Patient verbalized understanding and is in agreement with current care  plan. All questions answered prior to discharge.         Final Clinical Impression(s) / ED Diagnoses Final diagnoses:  Nausea and vomiting, unspecified vomiting type  Diarrhea, unspecified type  Open wound of scalp, unspecified open wound type, initial encounter    Rx / DC Orders ED Discharge Orders     None         Lianne Cure, DO 63/84/53 [id number]

## 2022-07-12 ENCOUNTER — Emergency Department (HOSPITAL_COMMUNITY)
Admission: EM | Admit: 2022-07-12 | Discharge: 2022-07-12 | Disposition: A | Discharge status: Home / Self Care | Payer: BLUE CROSS/BLUE SHIELD | Attending: Emergency Medicine | Admitting: Emergency Medicine

## 2022-07-12 ENCOUNTER — Other Ambulatory Visit: Payer: Self-pay

## 2022-07-12 DIAGNOSIS — R040 Epistaxis: Secondary | ICD-10-CM | POA: Diagnosis present

## 2022-07-12 MED ORDER — TRANEXAMIC ACID FOR EPISTAXIS
500.0000 mg | Freq: Once | TOPICAL | Status: AC
  Administered 2022-07-12: 500 mg via TOPICAL
  Filled 2022-07-12: qty 10

## 2022-07-12 MED ORDER — CEPHALEXIN 500 MG PO CAPS: 500.0000 mg | ORAL_CAPSULE | Freq: Four times a day (QID) | ORAL | 0 refills | Status: AC

## 2022-07-12 MED ORDER — OXYMETAZOLINE HCL 0.05 % NA SOLN
1.0000 | Freq: Once | NASAL | Status: AC
Start: 2022-07-12 — End: 2022-07-12
  Administered 2022-07-12: 1 via NASAL
  Filled 2022-07-12: qty 30

## 2022-07-12 MED ORDER — HYDROCODONE-ACETAMINOPHEN 5-325 MG PO TABS
1.0000 | ORAL_TABLET | Freq: Once | ORAL | Status: AC
  Administered 2022-07-12: 1 via ORAL
  Filled 2022-07-12: qty 1

## 2022-07-12 NOTE — ED Triage Notes (Signed)
Pt presents with epistaxis since after work today. States in the past 25 years he has had approximately 3 episodes like this that required packing or medical intervention of some kind.  Not on thinners.

## 2022-07-12 NOTE — Discharge Instructions (Signed)
You were seen today for a nosebleed.  Packing was placed.  Take antibiotics as prescribed.  Call the ENT office for follow-up.  You will need packing removal and assessment at that time.

## 2022-07-12 NOTE — ED Provider Notes (Signed)
[facility] Provider Note   CSN: [id number] Arrival date & time: 07/12/22  0028     History  Chief Complaint  Patient presents with   Epistaxis    Bradley Weber is a 47 y.o. male.  HPI     This is a 47 year old male who presents with epistaxis.  Patient reports he has had bleeding from the right naris for the last several hours after leaving work.  He has a history of the same.  Over the last 24 hours she reports being seen and evaluated at least 3 times and has had to have packing in the past.  He is not on any blood thinners.  Denies trauma.  Home Medications Prior to Admission medications   Medication Sig Start Date End Date Taking? Authorizing Provider  cephALEXin (KEFLEX) 500 MG capsule Take 1 capsule (500 mg total) by mouth 4 (four) times daily. 07/12/22  Yes Keaston Pile, Barbette Hair, MD  Aspirin-Acetaminophen-Caffeine (GOODY HEADACHE PO) Take 1 Package by mouth daily as needed (HA/Pain).    [provider]  ibuprofen (ADVIL) 200 MG tablet Take 200 mg by mouth every 6 (six) hours as needed for moderate pain.    [provider]      Allergies    Patient has no known allergies.    Review of Systems   Review of Systems  Constitutional:  Negative for fever.  HENT:  Positive for nosebleeds.   All other systems reviewed and are negative.   Physical Exam Updated Vital Signs BP 108/88   Pulse 99   Temp 98 F (36.7 C)   Resp 18   SpO2 96%  Physical Exam Vitals and nursing note reviewed.  Constitutional:      Appearance: He is well-developed. He is not ill-appearing.  HENT:     Head: Normocephalic and atraumatic.     Nose:     Comments: Clot noted in the right anterior naris, after clot was evacuated, brisk bleeding noted with significant friable nasal septum; however, no obvious source of the brisk bleed. Eyes:     Pupils: Pupils are equal, round, and reactive to light.  Cardiovascular:     Rate and  Rhythm: Normal rate and regular rhythm.  Pulmonary:     Effort: Pulmonary effort is normal. No respiratory distress.  Abdominal:     Palpations: Abdomen is soft.     Tenderness: There is no abdominal tenderness.  Musculoskeletal:     Cervical back: Neck supple.  Lymphadenopathy:     Cervical: No cervical adenopathy.  Skin:    General: Skin is warm and dry.  Neurological:     Mental Status: He is alert and oriented to person, place, and time.  Psychiatric:        Mood and Affect: Mood normal.     ED Results / Procedures / Treatments   Labs (all labs ordered are listed, but only abnormal results are displayed) Labs Reviewed - No data to display  EKG None  Radiology No results found.  Procedures .Epistaxis Management  Date/Time: 07/12/2022 2:20 AM  Performed by: Merryl Hacker, MD Authorized by: Merryl Hacker, MD   Consent:    Consent obtained:  Verbal   Consent given by:  Patient   Risks, benefits, and alternatives were discussed: yes     Risks discussed:  Bleeding, infection, nasal injury and pain   Alternatives discussed:  No treatment Anesthesia:    Anesthesia method:  None Procedure  details:    Treatment site:  R anterior   Treatment method:  Anterior pack and silver nitrate   Treatment complexity:  Extensive   Treatment episode: initial   Post-procedure details:    Assessment:  Bleeding decreased   Procedure completion:  Tolerated     Medications Ordered in ED Medications  oxymetazoline (AFRIN) 0.05 % nasal spray 1 spray (1 spray Each Nare Given by Other 07/12/22 0130)  tranexamic acid (CYKLOKAPRON) 1000 MG/10ML topical solution 500 mg (500 mg Topical Given by Other 07/12/22 0234)  HYDROcodone-acetaminophen (NORCO/VICODIN) 5-325 MG per tablet 1 tablet (1 tablet Oral Given 07/12/22 0234)    ED Course/ Medical Decision Making/ A&P Clinical Course as of 07/12/22 0403  Wed Jul 12, 2022  0403 On recheck, patient without recurrent bleeding.  Will  provide with ENT follow-up.  Will place on antibiotics while packing is in place. [CH]    Clinical Course User Index [CH] Yanissa Michalsky, Barbette Hair, MD                             Medical Decision Making Risk OTC drugs. Prescription drug management.   This patient presents to the ED for concern of epistaxis, this involves an extensive number of treatment options, and is a complaint that carries with it a high risk of complications and morbidity.  I considered the following differential and admission for this acute, potentially life threatening condition.  The differential diagnosis includes anterior bleed, posterior bleed, friable septal tissue, trauma  MDM:    This is a 47 year old male who presents with epistaxis.  Reports history of same.  He has fairly brisk bleeding from the right naris.  Unclear source although he does have a friable right nasal septum.  Initially clots were evacuated and he was given Afrin which seemed to slow the bleeding; however he had ongoing bleeding despite this.  A 5.5 Rhino Rocket was placed with 5 cc of air in the balloon.  Patient was monitored closely.  This appeared to tamponade the bleeding.  Will place on Keflex and have him follow-up with ENT.  He is not on any blood thinners.  Do not feel he needs any blood work.  (Labs, imaging, consults)  Labs: I Ordered, and personally interpreted labs.  The pertinent results include: None  Imaging Studies ordered: I ordered imaging studies including none I independently visualized and interpreted imaging. I agree with the radiologist interpretation  Additional history obtained from family at bedside.  External records from outside source obtained and reviewed including prior outside evaluations for epistaxis  Cardiac Monitoring: The patient was not maintained on a cardiac monitor.  If on the cardiac monitor, I personally viewed and interpreted the cardiac monitored which showed an underlying rhythm of:  N/A  Reevaluation: After the interventions noted above, I reevaluated the patient and found that they have :improved  Social Determinants of Health:  lives independently  Disposition: Discharge  Co morbidities that complicate the patient evaluation  Past Medical History:  Diagnosis Date   ADD (attention deficit disorder)    Basal cell carcinoma    Chronic headaches    Hepatitis C    Kidney stone    MRSA (methicillin resistant Staphylococcus aureus)      Medicines Meds ordered this encounter  Medications   oxymetazoline (AFRIN) 0.05 % nasal spray 1 spray   tranexamic acid (CYKLOKAPRON) 1000 MG/10ML topical solution 500 mg   HYDROcodone-acetaminophen (NORCO/VICODIN) 5-325 MG per tablet  1 tablet   cephALEXin (KEFLEX) 500 MG capsule    Sig: Take 1 capsule (500 mg total) by mouth 4 (four) times daily.    Dispense:  20 capsule    Refill:  0    I have reviewed the patients home medicines and have made adjustments as needed  Problem List / ED Course: Problem List Items Addressed This Visit   None Visit Diagnoses     Epistaxis    -  Primary                   Final Clinical Impression(s) / ED Diagnoses Final diagnoses:  Epistaxis    Rx / DC Orders ED Discharge Orders          Ordered    cephALEXin (KEFLEX) 500 MG capsule  4 times daily        07/12/22 0402              Ramah Langhans, Barbette Hair, MD 07/12/22 [phone]

## 2023-02-27 ENCOUNTER — Encounter (HOSPITAL_COMMUNITY): Payer: Self-pay | Admitting: Emergency Medicine

## 2023-02-27 ENCOUNTER — Other Ambulatory Visit: Payer: Self-pay

## 2023-02-27 ENCOUNTER — Emergency Department (HOSPITAL_COMMUNITY)
Admission: EM | Admit: 2023-02-27 | Discharge: 2023-02-27 | Disposition: A | Discharge status: Home / Self Care | Payer: Medicaid Other | Attending: Emergency Medicine | Admitting: Emergency Medicine

## 2023-02-27 DIAGNOSIS — R040 Epistaxis: Secondary | ICD-10-CM | POA: Diagnosis not present

## 2023-02-27 LAB — BASIC METABOLIC PANEL
Anion gap: 9 (ref 5–15)
BUN: 19 mg/dL (ref 6–20)
CO2: 27 mmol/L (ref 22–32)
Calcium: 9.2 mg/dL (ref 8.9–10.3)
Chloride: 102 mmol/L (ref 98–111)
Creatinine, Ser: 0.88 mg/dL (ref 0.61–1.24)
GFR, Estimated: >60 mL/min (ref >60)
Glucose, Bld: 100 mg/dL — ABNORMAL HIGH (ref 70–99)
Potassium: 4.3 mmol/L (ref 3.5–5.1)
Sodium: 138 mmol/L (ref 135–145)

## 2023-02-27 LAB — CBC
HCT: 41.7 % (ref 39.0–52.0)
Hemoglobin: 13.6 g/dL (ref 13.0–17.0)
MCH: 26.8 pg (ref 26.0–34.0)
MCHC: 32.6 g/dL (ref 30.0–36.0)
MCV: 82.2 fL (ref 80.0–100.0)
Platelets: 251 10*3/uL (ref 150–400)
RBC: 5.07 MIL/uL (ref 4.22–5.81)
RDW: 13.7 % (ref 11.5–15.5)
WBC: 7.4 10*3/uL (ref 4.0–10.5)
nRBC: 0 % (ref 0.0–0.2)

## 2023-02-27 LAB — PROTIME-INR
INR: 1 (ref 0.8–1.2)
Prothrombin Time: 13.2 s (ref 11.4–15.2)

## 2023-02-27 MED ORDER — TRANEXAMIC ACID FOR EPISTAXIS: 500.0000 mg | Freq: Once | TOPICAL | Status: DC

## 2023-02-27 MED ORDER — OXYMETAZOLINE HCL 0.05 % NA SOLN
1.0000 | Freq: Once | NASAL | Status: AC
  Administered 2023-02-27: 1 via NASAL
  Filled 2023-02-27: qty 30

## 2023-02-27 MED ORDER — TRANEXAMIC ACID FOR INHALATION
500.0000 mg | Freq: Once | RESPIRATORY_TRACT | Status: AC
  Administered 2023-02-27: 500 mg via RESPIRATORY_TRACT
  Filled 2023-02-27: qty 10

## 2023-02-27 MED ORDER — AMOXICILLIN-POT CLAVULANATE 875-125 MG PO TABS: 1.0000 | ORAL_TABLET | Freq: Two times a day (BID) | ORAL | 0 refills | Status: AC

## 2023-02-27 MED ORDER — TRANEXAMIC ACID-NACL 1000-0.7 MG/100ML-% IV SOLN
1000.0000 mg | INTRAVENOUS | Status: AC
  Administered 2023-02-27: 1000 mg via INTRAVENOUS

## 2023-02-27 NOTE — ED Provider Notes (Signed)
[facility] AT [facility] Provider Note   CSN: [id number] Arrival date & time: 02/27/23  4098     History  Chief Complaint  Patient presents with   Epistaxis    Bradley Weber is a 47 y.o. male.  Patient with past medical history significant for chronic hepatitis C, bacteremia from IV drug use, nosebleeds requiring packing presents to the emergency room complaining of a nosebleed which is been ongoing since 7 AM.  Patient has used "a whole bottle" of Afrin with no relief of symptoms.  He states in the past he has had his nose packed with different packing with subsequent ENT follow-up.  He denies shortness of breath, chest pain, other symptoms at this time.   Epistaxis      Home Medications Prior to Admission medications   Medication Sig Start Date End Date Taking? Authorizing Provider  amoxicillin-clavulanate (AUGMENTIN) 875-125 MG tablet Take 1 tablet by mouth every 12 (twelve) hours. 02/27/23  Yes Darrick Grinder, PA-C  Aspirin-Acetaminophen-Caffeine (GOODY HEADACHE PO) Take 1 Package by mouth daily as needed (HA/Pain).    [provider]  cephALEXin (KEFLEX) 500 MG capsule Take 1 capsule (500 mg total) by mouth 4 (four) times daily. 07/12/22   Horton, Mayer Masker, MD  ibuprofen (ADVIL) 200 MG tablet Take 200 mg by mouth every 6 (six) hours as needed for moderate pain.    [provider]      Allergies    Patient has no known allergies.    Review of Systems   Review of Systems  HENT:  Positive for nosebleeds.     Physical Exam Updated Vital Signs BP 119/79   Pulse (!) 108   Temp 98 F (36.7 C)   Resp 18   Wt 76.2 kg   SpO2 100%   BMI 23.43 kg/m  Physical Exam Vitals and nursing note reviewed.  HENT:     Head: Normocephalic and atraumatic.     Nose:     Right Nostril: Epistaxis present. No septal hematoma or occlusion.     Left Nostril: No epistaxis, septal hematoma or occlusion.  Eyes:     Pupils: Pupils  are equal, round, and reactive to light.  Pulmonary:     Effort: Pulmonary effort is normal. No respiratory distress.  Musculoskeletal:        General: No signs of injury.     Cervical back: Normal range of motion.  Skin:    General: Skin is dry.  Neurological:     Mental Status: He is alert.  Psychiatric:        Speech: Speech normal.        Behavior: Behavior normal.     ED Results / Procedures / Treatments   Labs (all labs ordered are listed, but only abnormal results are displayed) Labs Reviewed  BASIC METABOLIC PANEL - Abnormal; Notable for the following components:      Result Value   Glucose, Bld 100 (*)    All other components within normal limits  CBC  PROTIME-INR    EKG None  Radiology No results found.  Procedures Ultrasound ED Peripheral IV (Provider)  Date/Time: 02/27/2023 5:13 AM  Performed by: Darrick Grinder, PA-C Authorized by: Darrick Grinder, PA-C   Procedure details:    Indications: poor IV access     Skin Prep: chlorhexidine gluconate     Location:  Right AC   Angiocath:  20 G   Bedside Ultrasound Guided: Yes  Images: archived   .Epistaxis Management  Date/Time: 02/27/2023 6:18 AM  Performed by: Darrick Grinder, PA-C Authorized by: Darrick Grinder, PA-C   Consent:    Consent obtained:  Verbal   Consent given by:  Patient   Risks, benefits, and alternatives were discussed: yes     Risks discussed:  Bleeding, infection, nasal injury and pain Universal protocol:    Procedure explained and questions answered to patient or proxy's satisfaction: yes     Patient identity confirmed:  Verbally with patient Anesthesia:    Anesthesia method:  None Procedure details:    Treatment site:  Unable to specify   Treatment method:  Nasal balloon   Treatment complexity:  Extensive   Treatment episode: recurring   Post-procedure details:    Assessment:  Bleeding decreased   Procedure completion:  Tolerated well, no immediate  complications     Medications Ordered in ED Medications  oxymetazoline (AFRIN) 0.05 % nasal spray 1 spray (1 spray Each Nare Given 02/27/23 0405)  tranexamic acid (CYKLOKAPRON) IVPB 1,000 mg (0 mg Intravenous Stopped 02/27/23 0547)  tranexamic acid (CYKLOKAPRON) 1000 MG/10ML nebulizer solution 500 mg (500 mg Nebulization Given 02/27/23 4010)    ED Course/ Medical Decision Making/ A&P                                 Medical Decision Making Amount and/or Complexity of Data Reviewed Labs: ordered.  Risk OTC drugs. Prescription drug management.   This patient presents to the ED for concern of epistaxis, this involves an extensive number of treatment options, and is a complaint that carries with it a high risk of complications and morbidity.    Co morbidities that complicate the patient evaluation  Hx IV drug use, hepatitis C   Additional history obtained:  Additional history obtained from visitor at bedside External records from outside source obtained and reviewed including ENT notes   Lab Tests:  I Ordered, and personally interpreted labs.  The pertinent results include:  grossly unremarkable CBC, BMP   Problem List / ED Course / Critical interventions / Medication management   I ordered medication including TXA and afrin for epistaxis  Reevaluation of the patient after these medicines showed that the patient improved I have reviewed the patients home medicines and have made adjustments as needed   Social Determinants of Health:  Patient has Medicaid for his primary health insurance type   Test / Admission - Considered:  Plan to reassess after Rhino balloon insertion. If epistaxis is controlled will discharge home with course of Augmentin and recommend follow-up with ENT.   Rhino balloon unsuccessful.  Bleeding slowed but not stopped.  At this time will consult ENT for further management.  Patient care being signed out to [employer], PA-C at shift  handoff.         Final Clinical Impression(s) / ED Diagnoses Final diagnoses:  Epistaxis, recurrent    Rx / DC Orders ED Discharge Orders          Ordered    amoxicillin-clavulanate (AUGMENTIN) 875-125 MG tablet  Every 12 hours        02/27/23 0621              Darrick Grinder, PA-C 02/27/23 2725    Palumbo, April, MD 02/27/23 [phone]

## 2023-02-27 NOTE — ED Triage Notes (Signed)
Pt arrives with nosebleed to bilateral nostrils, ongoing since 7am yesterday. Has 5 gallon bucket full of bloody tissues, blood dripping from nares. Pt states hx of epistaxis about every 3 years, has been using Afrin with no relief. No thinners or trauma reported

## 2023-02-27 NOTE — ED Provider Notes (Signed)
  Physical Exam  BP 132/79 (BP Location: Right Arm)   Pulse 63   Temp 97.7 F (36.5 C) (Oral)   Resp 20   Wt 76.2 kg   SpO2 100%   BMI 23.43 kg/m   Physical Exam Vitals and nursing note reviewed.  Constitutional:      General: He is not in acute distress.    Appearance: Normal appearance. He is normal weight. He is not ill-appearing.  HENT:     Head: Normocephalic and atraumatic.     Nose:     Comments: Rhino Rocket to right nare.  Small amount of oozing around this Pulmonary:     Effort: Pulmonary effort is normal. No respiratory distress.  Abdominal:     General: Abdomen is flat.  Musculoskeletal:        General: Normal range of motion.     Cervical back: Neck supple.  Skin:    General: Skin is warm and dry.  Neurological:     Mental Status: He is alert and oriented to person, place, and time.  Psychiatric:        Mood and Affect: Mood normal.        Behavior: Behavior normal.     Procedures  Procedures  ED Course / MDM   Clinical Course as of 02/27/23 0807  Tue Feb 27, 2023  1610 Dr. Jearld Fenton with ENT to assess patient [AS]  0802 Dr. Jenne Pane with [facility] E[facility] recommends keeping packing in for 3 to 5 days, follow-up in office [AS]    Clinical Course User Index [AS] Lula Olszewski Edsel Petrin, PA-C   Medical Decision Making Amount and/or Complexity of Data Reviewed Labs: ordered.  Risk OTC drugs. Prescription drug management.  Assumed care at shift change from previous provider.  Please see previous note for full HPI.  In short, 47 year old male presenting to the ED for evaluation of atraumatic right-sided epistaxis.  Does have a history of polysubstance abuse.  Patient has tried Afrin at home.  He was given Afrin, nebulized TXA and liquid TXA in the emergency department.  Placed a [employer].  Symptoms persist.  Plan at the time to change is to consult ENT for further management.  0645-consulted with ENT Dr. Donell Beers.  Patient is an established patient of  [facility].  He recommends consulting [facility] ENT.  0800-consult with [facility] ENT Dr. Jenne Pane.  He recommends keeping the [employer] in place for 3 days, following up in office, return precautions.  I discussed this with the patient.  He is in agreement with plan.  His bleeding appears to have stopped and there is no more oozing around the [employer].  He was given return precautions.  Stable at discharge.  At this time there does not appear to be any evidence of an acute emergency medical condition and the patient appears stable for discharge with appropriate outpatient follow up. Diagnosis was discussed with patient who verbalizes understanding of care plan and is agreeable to discharge. I have discussed return precautions with patient who verbalizes understanding. Patient encouraged to follow-up with their PCP within 1 week. All questions answered.  Note: Portions of this report may have been transcribed using voice recognition software. Every effort was made to ensure accuracy; however, inadvertent computerized transcription errors may still be present.   Michelle Piper, Cordelia Poche 02/27/23 9604    Gerhard Munch, MD 03/02/23 [phone]

## 2023-02-27 NOTE — Discharge Instructions (Addendum)
You have been seen today for your complaint of nosebleed. Home care instructions are as follows:  Keep the packing in place until you follow-up with the nose doctor Follow up with: Dr. Pollyann Kennedy.  Call today to schedule an appointment for Friday Please seek immediate medical care if you develop any of the following symptoms: You have a nosebleed after you fall or hurt your head. Your nosebleed does not go away after 20 minutes. You feel dizzy or weak. You have unusual bleeding from other parts of your body. You have unusual bruising on other parts of your body. You get sweaty. You vomit blood. At this time there does not appear to be the presence of an emergent medical condition, however there is always the potential for conditions to change. Please read and follow the below instructions.  Do not take your medicine if  develop an itchy rash, swelling in your mouth or lips, or difficulty breathing; call 911 and seek immediate emergency medical attention if this occurs.  You may review your lab tests and imaging results in their entirety on your MyChart account.  Please discuss all results of fully with your primary care provider and other specialist at your follow-up visit.  Note: Portions of this text may have been transcribed using voice recognition software. Every effort was made to ensure accuracy; however, inadvertent computerized transcription errors may still be present.

## 2023-03-02 DIAGNOSIS — R040 Epistaxis: Secondary | ICD-10-CM | POA: Diagnosis not present

## 2023-03-05 DIAGNOSIS — R040 Epistaxis: Secondary | ICD-10-CM | POA: Diagnosis not present

## 2023-03-05 DIAGNOSIS — J342 Deviated nasal septum: Secondary | ICD-10-CM | POA: Diagnosis not present

## 2023-04-10 NOTE — H&P (Signed)
HPI:   Bradley Weber is a 47 y.o. male who presents as a new Patient.   Referring Provider: Self, Referral  Chief complaint: Epistaxis.  HPI: He had a bad case of epistaxis on the right side 5 days ago. Was treated in the emergency department and had inflatable packing placed. No bleeding since then. The packing accidentally came out in his sleep 2 nights ago and he swallowed it. He has had 2 other bad nosebleeds 1 about 4 years ago and 1 about 25 years ago. He is not on blood thinner. He does not use nasal sprays. He used Afrin many years ago on a repeated basis but has not done that in years.  PMH/Meds/All/SocHx/FamHx/ROS:   Past Medical History:  Diagnosis Date  ADHD (attention deficit hyperactivity disorder)  Kidney stone   History reviewed. No pertinent surgical history.  No family history of bleeding disorders, wound healing problems or difficulty with anesthesia.     Current Outpatient Medications:  dextroamphetamine-amphetamine (ADDERALL) 30 mg tablet, Take 90 mg by mouth Once Daily., Disp: , Rfl:   A complete ROS was performed with pertinent positives/negatives noted in the HPI. The remainder of the ROS are negative.   Physical Exam:   Pulse 104  Temp 97.5 F (36.4 C) (Temporal)  Resp 18  Ht 1.778 m (5\' 10" )  Wt 78.4 kg (172 lb 12.8 oz)  SpO2 100%  BMI 24.79 kg/m   General: Healthy and alert, in no distress, breathing easily. Normal affect. In a pleasant mood. Head: Normocephalic, atraumatic. No masses, or scars. Eyes: Pupils are equal, and reactive to light. Vision is grossly intact. No spontaneous or gaze nystagmus. Ears: Ear canals are clear. Tympanic membranes are intact, with normal landmarks and the middle ears are clear and healthy. Hearing: Grossly normal. Nose: Nasal cavities are clear with healthy mucosa, no polyps or exudate. Airways are patent. Face: No masses or scars, facial nerve function is symmetric. Oral Cavity: No mucosal  abnormalities are noted. Tongue with normal mobility. Dentition appears healthy. Oropharynx: Tonsils are symmetric. There are no mucosal masses identified. Tongue base appears normal and healthy. Larynx/Hypopharynx: deferred Chest: Deferred Neck: No palpable masses, no cervical adenopathy, no thyroid nodules or enlargement. Neuro: Cranial nerves II-XII with normal function. Balance: Normal gate. Other findings: none.  Independent Review of Additional Tests or Records:  none  Procedures:  none  Impression & Plans:  Normal examination. Recent epistaxis resolved. Recommend use of nasal saline spray several times daily for the rest of his life to help try to avoid any further issues with this. Follow-up here if he ever has any problems again.

## 2023-04-17 NOTE — Progress Notes (Signed)
Per Pt, he was under the impression that his surgery was scheduled for 04/26/23 instead of 1/2.  Pt decided to cancel and will call Dr Lucky Rathke office to schedule it for some time in February 2025.  OR desk notified.

## 2023-04-19 ENCOUNTER — Encounter (HOSPITAL_COMMUNITY): Payer: Self-pay | Admitting: Anesthesiology

## 2023-04-19 ENCOUNTER — Ambulatory Visit (HOSPITAL_COMMUNITY): Admission: RE | Admit: 2023-04-19 | Payer: Medicaid Other | Source: Ambulatory Visit | Admitting: Otolaryngology

## 2023-04-19 ENCOUNTER — Encounter (HOSPITAL_COMMUNITY): Admission: RE | Payer: Self-pay | Source: Ambulatory Visit

## 2023-04-19 SURGERY — CONTROL OF EPISTAXIS
Anesthesia: General | Laterality: Bilateral

## 2023-04-19 NOTE — Anesthesia Preprocedure Evaluation (Signed)
 Anesthesia Evaluation    Reviewed: Allergy & Precautions, Patient's Chart, lab work & pertinent test results  Airway        Dental   Pulmonary Current Smoker          Cardiovascular negative cardio ROS      Neuro/Psych  Headaches  negative psych ROS   GI/Hepatic negative GI ROS,,,(+) Hepatitis -, C  Endo/Other  negative endocrine ROS    Renal/GU negative Renal ROS  negative genitourinary   Musculoskeletal negative musculoskeletal ROS (+)    Abdominal   Peds  Hematology negative hematology ROS (+)   Anesthesia Other Findings   Reproductive/Obstetrics negative OB ROS                             Anesthesia Physical Anesthesia Plan  ASA: 2  Anesthesia Plan: General   Post-op Pain Management: Tylenol  PO (pre-op)*   Induction: Intravenous and Rapid sequence  PONV Risk Score and Plan: 1 and Ondansetron , Dexamethasone, Treatment may vary due to age or medical condition and Midazolam  Airway Management Planned: Oral ETT  Additional Equipment: None  Intra-op Plan:   Post-operative Plan: Extubation in OR  Informed Consent:   Plan Discussed with:   Anesthesia Plan Comments:        Anesthesia Quick Evaluation

## 2023-05-30 NOTE — H&P (Signed)
HPI:   Bradley Weber is a 48 y.o. male who presents as a new Patient.   Referring Provider: Self, Referral  Chief complaint: Epistaxis.  HPI: He had a bad case of epistaxis on the right side 5 days ago. Was treated in the emergency department and had inflatable packing placed. No bleeding since then. The packing accidentally came out in his sleep 2 nights ago and he swallowed it. He has had 2 other bad nosebleeds 1 about 4 years ago and 1 about 25 years ago. He is not on blood thinner. He does not use nasal sprays. He used Afrin many years ago on a repeated basis but has not done that in years.  PMH/Meds/All/SocHx/FamHx/ROS:   Past Medical History:  Diagnosis Date  ADHD (attention deficit hyperactivity disorder)  Kidney stone   History reviewed. No pertinent surgical history.  No family history of bleeding disorders, wound healing problems or difficulty with anesthesia.     Current Outpatient Medications:  dextroamphetamine-amphetamine (ADDERALL) 30 mg tablet, Take 90 mg by mouth Once Daily., Disp: , Rfl:   A complete ROS was performed with pertinent positives/negatives noted in the HPI. The remainder of the ROS are negative.   Physical Exam:   Pulse 104  Temp 97.5 F (36.4 C) (Temporal)  Resp 18  Ht 1.778 m (5\' 10" )  Wt 78.4 kg (172 lb 12.8 oz)  SpO2 100%  BMI 24.79 kg/m   General: Healthy and alert, in no distress, breathing easily. Normal affect. In a pleasant mood. Head: Normocephalic, atraumatic. No masses, or scars. Eyes: Pupils are equal, and reactive to light. Vision is grossly intact. No spontaneous or gaze nystagmus. Ears: Ear canals are clear. Tympanic membranes are intact, with normal landmarks and the middle ears are clear and healthy. Hearing: Grossly normal. Nose: Nasal cavities are clear with healthy mucosa, no polyps or exudate. Airways are patent. Face: No masses or scars, facial nerve function is symmetric. Oral Cavity: No mucosal  abnormalities are noted. Tongue with normal mobility. Dentition appears healthy. Oropharynx: Tonsils are symmetric. There are no mucosal masses identified. Tongue base appears normal and healthy. Larynx/Hypopharynx: deferred Chest: Deferred Neck: No palpable masses, no cervical adenopathy, no thyroid nodules or enlargement. Neuro: Cranial nerves II-XII with normal function. Balance: Normal gate. Other findings: none.  Independent Review of Additional Tests or Records:  none  Procedures:  none  Impression & Plans:  Normal examination. Recent epistaxis resolved. Recommend use of nasal saline spray several times daily for the rest of his life to help try to avoid any further issues with this. Follow-up here if he ever has any problems again.

## 2023-06-05 NOTE — Progress Notes (Signed)
Patient states that he is not going to have surgery 2/19.  Elease Hashimoto at Dr. Lucky Rathke office notified.  Notified OR desk.

## 2023-06-06 ENCOUNTER — Encounter (HOSPITAL_COMMUNITY): Admission: RE | Payer: Self-pay | Source: Ambulatory Visit

## 2023-06-06 ENCOUNTER — Ambulatory Visit (HOSPITAL_COMMUNITY): Admission: RE | Admit: 2023-06-06 | Payer: Medicaid Other | Source: Ambulatory Visit | Admitting: Otolaryngology

## 2023-06-06 SURGERY — SEPTOPLASTY, NOSE, WITH NASAL TURBINATE REDUCTION
Anesthesia: General | Laterality: Bilateral

## 2023-07-24 ENCOUNTER — Ambulatory Visit: Payer: Medicaid Other | Admitting: Urgent Care

## 2023-09-06 IMAGING — CT CT CHEST-ABD-PELV W/ CM
2 of 5 series · 13 of 36 positions shown, 15 images · IV contrast (Omni 300)
Comparison: CT abdomen and pelvis 12/19/2018

CLINICAL DATA: Nausea/vomiting, pain

EXAM:
CT CHEST, ABDOMEN, AND PELVIS WITH CONTRAST
TECHNIQUE: Multidetector CT imaging of the chest, abdomen and pelvis was
performed following the standard protocol during bolus
administration of intravenous contrast.

[Series 3: cap with 5mm st · axial · 0.89mm/px · z∈[+952,+1526]mm · 10 of 141 slices shown, 12 images]
[im 13/141  mediastinal]
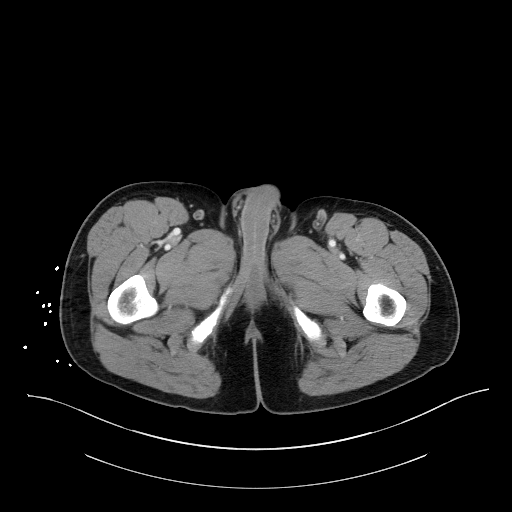
[im 13/141  bone]
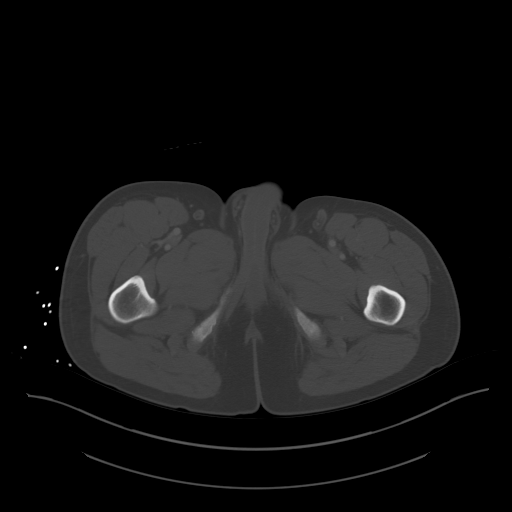
[im 26/141  mediastinal]
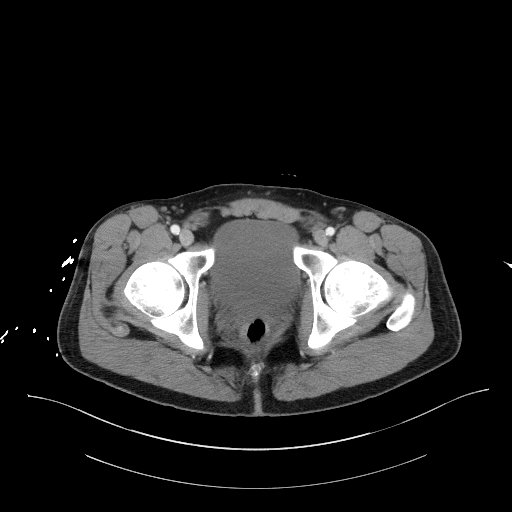
[im 39/141  mediastinal]
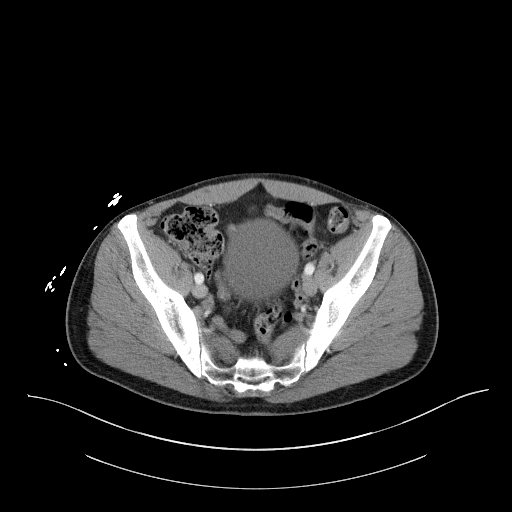
[im 51/141  mediastinal]
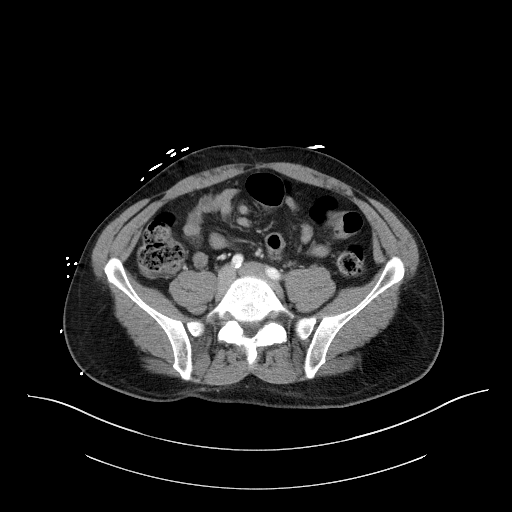
[im 64/141  mediastinal]
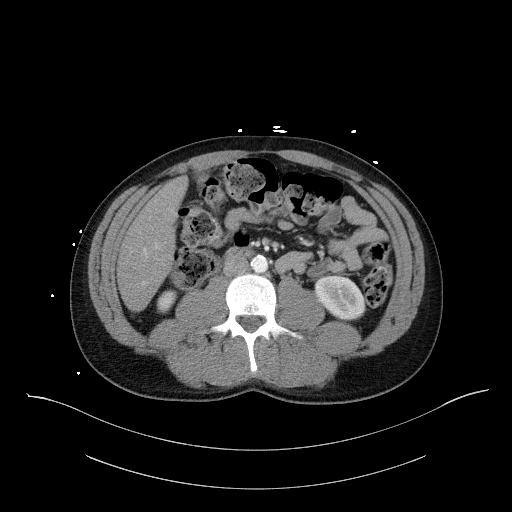
[im 77/141  mediastinal]
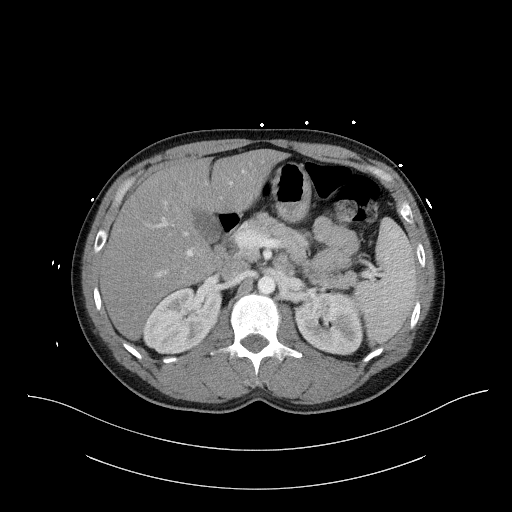
[im 90/141  mediastinal]
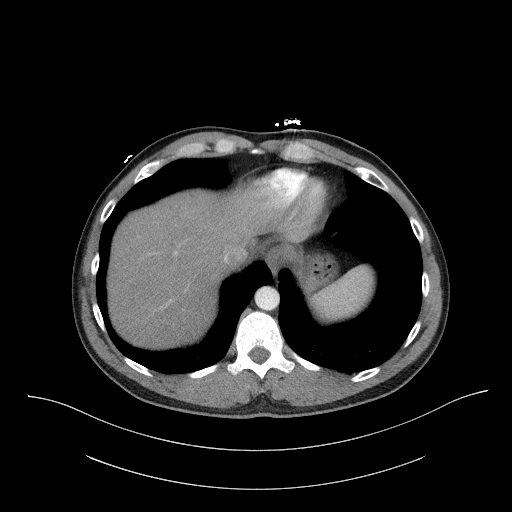
[im 102/141  mediastinal]
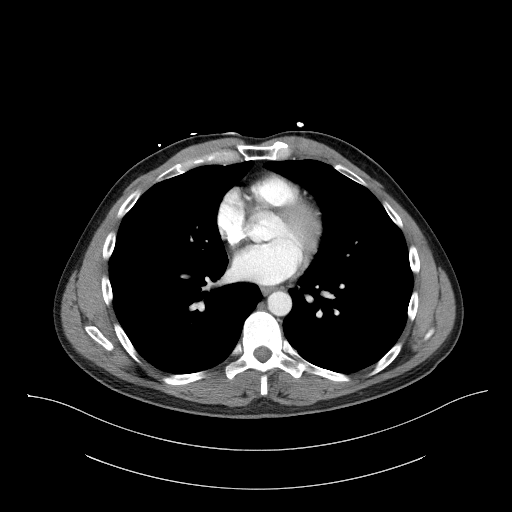
[im 115/141  mediastinal]
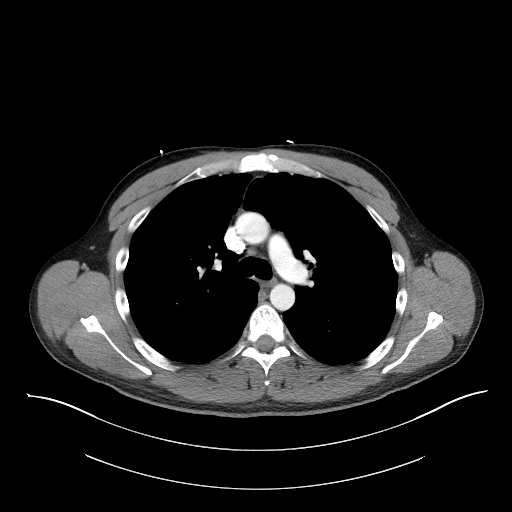
[im 115/141  bone]
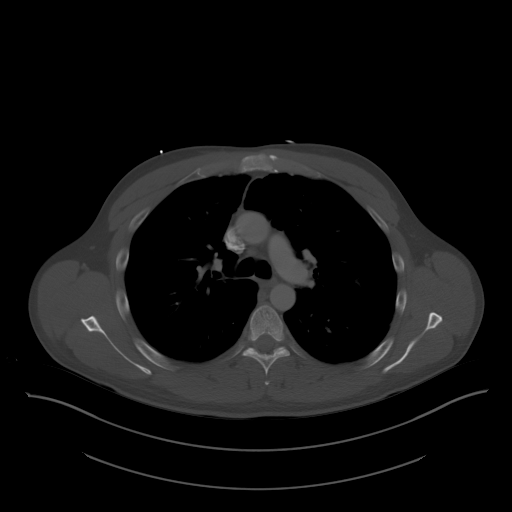
[im 128/141  mediastinal]
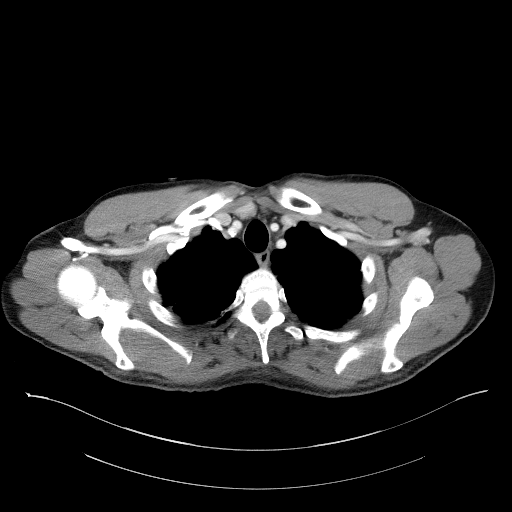

[Series 6: cap with 3mm st cor · coronal · 0.76mm/px · 3 of 150 slices shown]
[im 30/150  mediastinal]
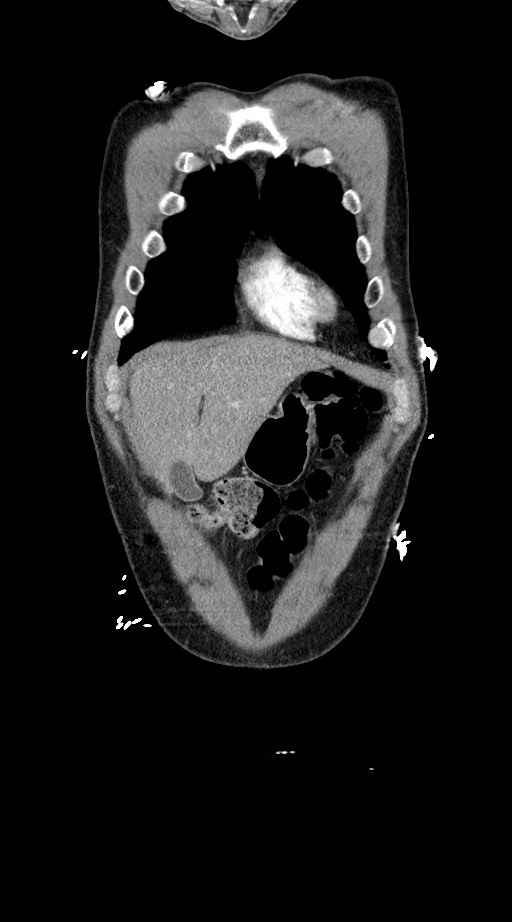
[im 60/150  mediastinal]
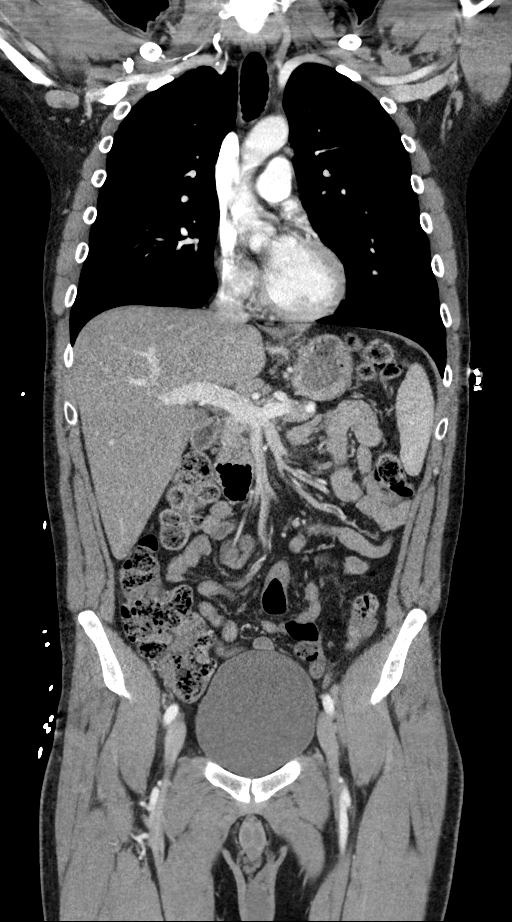
[im 90/150  mediastinal]
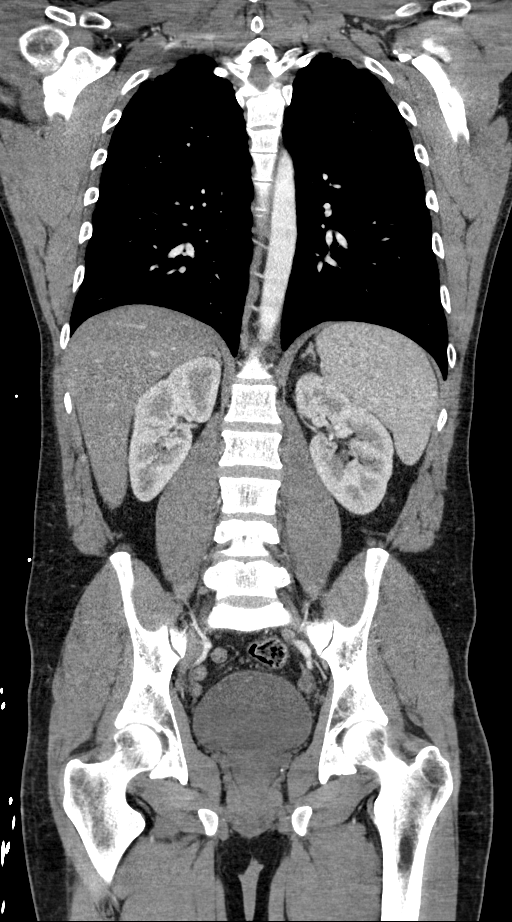

[13 of 36 positions shown; findings below may reference images not displayed]

RADIATION DOSE REDUCTION: This exam was performed according to the
departmental dose-optimization program which includes automated
exposure control, adjustment of the mA and/or kV according to
patient size and/or use of iterative reconstruction technique.

CONTRAST:  100mL OMNIPAQUE IOHEXOL 300 MG/ML  SOLN
FINDINGS: CT CHEST FINDINGS

Cardiovascular: Normal cardiac size.No pericardial disease.Normal
size main and branch pulmonary arteries.No central pulmonary
embolism. The thoracic aorta is unremarkable.

Mediastinum/Nodes: No lymphadenopathy.The thyroid is
unremarkable.Esophagus is unremarkable.The trachea is unremarkable.

Lungs/Pleura: No focal airspace consolidation. Bibasilar
hypoventilatory changes. Moderate paraseptal emphysema, mid to
apical predominant.No pleural effusion. No pneumothorax. Discoid
nodule in the lingula likely intrapulmonary lymph node. (Series 5,
image 118). Additional discoid and likely intrapulmonary lymph node
along the right minor fissure. No suspicious pulmonary nodules or
masses.

Musculoskeletal: Probable chronic mild anterior height loss of T5.
No acute osseous abnormality.No suspicious lytic or blastic lesions.

CT ABDOMEN PELVIS FINDINGS

Hepatobiliary: Probable hepatic steatosis. The gallbladder is
unremarkable.

Pancreas: Unremarkable. No pancreatic ductal dilatation or
surrounding inflammatory changes.

Spleen: Probable small splenules. The spleen is otherwise
unremarkable.

Adrenals/Urinary Tract: Adrenal glands are unremarkable. No
hydronephrosis or nephrolithiasis. The bladder is well distended but
otherwise unremarkable.

Stomach/Bowel: Stomach is within normal limits. Appendix appears
normal. No evidence of bowel wall thickening, distention, or
inflammatory changes.

Vascular/Lymphatic: Scattered aortoiliac atherosclerotic
calcifications. No AAA. No lymphadenopathy.

Reproductive: Unremarkable.

Other: No bowel containing hernia. No abdominopelvic ascites. No
free air.

Musculoskeletal: No acute osseous abnormality. No suspicious lytic
or blastic lesions. Small os acetabuli on the right.
IMPRESSION: No acute findings in the chest.

No acute findings in the abdomen or pelvis.  Normal appendix.

Hepatic steatosis.
